# Patient Record
Sex: Male | Born: 1975
Health system: Southern US, Community
[De-identification: ages and names within clinical notes are randomized; demographics above are authoritative.]

## PROBLEM LIST (undated history)

## (undated) DIAGNOSIS — K700 Alcoholic fatty liver: Secondary | ICD-10-CM

## (undated) DIAGNOSIS — F1020 Alcohol dependence, uncomplicated: Secondary | ICD-10-CM

## (undated) DIAGNOSIS — I5022 Chronic systolic (congestive) heart failure: Secondary | ICD-10-CM

## (undated) DIAGNOSIS — I10 Essential (primary) hypertension: Secondary | ICD-10-CM

## (undated) DIAGNOSIS — G4733 Obstructive sleep apnea (adult) (pediatric): Secondary | ICD-10-CM

## (undated) DIAGNOSIS — F419 Anxiety disorder, unspecified: Secondary | ICD-10-CM

## (undated) DIAGNOSIS — E119 Type 2 diabetes mellitus without complications: Secondary | ICD-10-CM

## (undated) HISTORY — DX: Chronic systolic (congestive) heart failure: I50.22

## (undated) HISTORY — DX: Type 2 diabetes mellitus without complications: E11.9

---

## 2020-12-06 ENCOUNTER — Encounter (HOSPITAL_COMMUNITY): Payer: Self-pay

## 2020-12-06 ENCOUNTER — Inpatient Hospital Stay (HOSPITAL_COMMUNITY)
Admission: EM | Admit: 2020-12-06 | Discharge: 2020-12-11 | DRG: 175 | Disposition: A | Discharge status: Home / Self Care | Payer: 59 | Attending: Internal Medicine | Admitting: Internal Medicine

## 2020-12-06 ENCOUNTER — Other Ambulatory Visit: Payer: Self-pay

## 2020-12-06 ENCOUNTER — Emergency Department (HOSPITAL_COMMUNITY): Payer: 59

## 2020-12-06 DIAGNOSIS — E876 Hypokalemia: Secondary | ICD-10-CM | POA: Diagnosis not present

## 2020-12-06 DIAGNOSIS — I313 Pericardial effusion (noninflammatory): Secondary | ICD-10-CM | POA: Diagnosis present

## 2020-12-06 DIAGNOSIS — F419 Anxiety disorder, unspecified: Secondary | ICD-10-CM | POA: Diagnosis present

## 2020-12-06 DIAGNOSIS — N62 Hypertrophy of breast: Secondary | ICD-10-CM | POA: Diagnosis present

## 2020-12-06 DIAGNOSIS — R0902 Hypoxemia: Secondary | ICD-10-CM

## 2020-12-06 DIAGNOSIS — K7460 Unspecified cirrhosis of liver: Secondary | ICD-10-CM | POA: Diagnosis present

## 2020-12-06 DIAGNOSIS — I5021 Acute systolic (congestive) heart failure: Secondary | ICD-10-CM | POA: Diagnosis present

## 2020-12-06 DIAGNOSIS — R188 Other ascites: Secondary | ICD-10-CM | POA: Diagnosis present

## 2020-12-06 DIAGNOSIS — I2699 Other pulmonary embolism without acute cor pulmonale: Secondary | ICD-10-CM | POA: Diagnosis not present

## 2020-12-06 DIAGNOSIS — Z6832 Body mass index (BMI) 32.0-32.9, adult: Secondary | ICD-10-CM

## 2020-12-06 DIAGNOSIS — I081 Rheumatic disorders of both mitral and tricuspid valves: Secondary | ICD-10-CM | POA: Diagnosis present

## 2020-12-06 DIAGNOSIS — Z20822 Contact with and (suspected) exposure to covid-19: Secondary | ICD-10-CM | POA: Diagnosis present

## 2020-12-06 DIAGNOSIS — J9601 Acute respiratory failure with hypoxia: Secondary | ICD-10-CM | POA: Diagnosis present

## 2020-12-06 DIAGNOSIS — I110 Hypertensive heart disease with heart failure: Secondary | ICD-10-CM | POA: Diagnosis present

## 2020-12-06 DIAGNOSIS — R000 Tachycardia, unspecified: Secondary | ICD-10-CM

## 2020-12-06 DIAGNOSIS — Z87891 Personal history of nicotine dependence: Secondary | ICD-10-CM

## 2020-12-06 DIAGNOSIS — I272 Pulmonary hypertension, unspecified: Secondary | ICD-10-CM | POA: Diagnosis present

## 2020-12-06 DIAGNOSIS — R109 Unspecified abdominal pain: Secondary | ICD-10-CM

## 2020-12-06 DIAGNOSIS — I2694 Multiple subsegmental pulmonary emboli without acute cor pulmonale: Principal | ICD-10-CM | POA: Diagnosis present

## 2020-12-06 DIAGNOSIS — F1021 Alcohol dependence, in remission: Secondary | ICD-10-CM | POA: Diagnosis present

## 2020-12-06 DIAGNOSIS — G4733 Obstructive sleep apnea (adult) (pediatric): Secondary | ICD-10-CM | POA: Diagnosis present

## 2020-12-06 DIAGNOSIS — E119 Type 2 diabetes mellitus without complications: Secondary | ICD-10-CM | POA: Diagnosis present

## 2020-12-06 DIAGNOSIS — Z79899 Other long term (current) drug therapy: Secondary | ICD-10-CM

## 2020-12-06 DIAGNOSIS — I420 Dilated cardiomyopathy: Secondary | ICD-10-CM

## 2020-12-06 HISTORY — DX: Anxiety disorder, unspecified: F41.9

## 2020-12-06 HISTORY — DX: Alcoholic fatty liver: K70.0

## 2020-12-06 HISTORY — DX: Obstructive sleep apnea (adult) (pediatric): G47.33

## 2020-12-06 HISTORY — DX: Alcohol dependence, uncomplicated: F10.20

## 2020-12-06 HISTORY — DX: Essential (primary) hypertension: I10

## 2020-12-06 LAB — URINALYSIS, ROUTINE W REFLEX MICROSCOPIC
Bilirubin Urine: NEGATIVE
Glucose, UA: NEGATIVE mg/dL
Hgb urine dipstick: NEGATIVE
Ketones, ur: 5 mg/dL — AB
Leukocytes,Ua: NEGATIVE
Nitrite: NEGATIVE
Protein, ur: 100 mg/dL — AB
Specific Gravity, Urine: 1.018 (ref 1.005–1.030)
pH: 5 (ref 5.0–8.0)

## 2020-12-06 LAB — COMPREHENSIVE METABOLIC PANEL
ALT: 21 U/L (ref 0–44)
AST: 27 U/L (ref 15–41)
Albumin: 3.8 g/dL (ref 3.5–5.0)
Alkaline Phosphatase: 90 U/L (ref 38–126)
Anion gap: 15 (ref 5–15)
BUN: 14 mg/dL (ref 6–20)
CO2: 23 mmol/L (ref 22–32)
Calcium: 9.1 mg/dL (ref 8.9–10.3)
Chloride: 101 mmol/L (ref 98–111)
Creatinine, Ser: 1.17 mg/dL (ref 0.61–1.24)
GFR, Estimated: >60 mL/min (ref >60)
Glucose, Bld: 162 mg/dL — ABNORMAL HIGH (ref 70–99)
Potassium: 3.5 mmol/L (ref 3.5–5.1)
Sodium: 139 mmol/L (ref 135–145)
Total Bilirubin: 1.8 mg/dL — ABNORMAL HIGH (ref 0.3–1.2)
Total Protein: 6.9 g/dL (ref 6.5–8.1)

## 2020-12-06 LAB — CBC
HCT: 50.4 % (ref 39.0–52.0)
Hemoglobin: 15.9 g/dL (ref 13.0–17.0)
MCH: 30.6 pg (ref 26.0–34.0)
MCHC: 31.5 g/dL (ref 30.0–36.0)
MCV: 96.9 fL (ref 80.0–100.0)
Platelets: 303 10*3/uL (ref 150–400)
RBC: 5.2 MIL/uL (ref 4.22–5.81)
RDW: 12.7 % (ref 11.5–15.5)
WBC: 9.3 10*3/uL (ref 4.0–10.5)
nRBC: 0 % (ref 0.0–0.2)

## 2020-12-06 LAB — PROTIME-INR
INR: 1.3 — ABNORMAL HIGH (ref 0.8–1.2)
Prothrombin Time: 15.7 seconds — ABNORMAL HIGH (ref 11.4–15.2)

## 2020-12-06 LAB — LIPASE, BLOOD: Lipase: 44 U/L (ref 11–51)

## 2020-12-06 LAB — BRAIN NATRIURETIC PEPTIDE: B Natriuretic Peptide: 1516.1 pg/mL — ABNORMAL HIGH (ref 0.0–100.0)

## 2020-12-06 MED ORDER — IOHEXOL 350 MG/ML SOLN
80.0000 mL | Freq: Once | INTRAVENOUS | Status: AC | PRN
  Administered 2020-12-06: 80 mL via INTRAVENOUS

## 2020-12-06 MED ORDER — ENOXAPARIN SODIUM 120 MG/0.8ML ~~LOC~~ SOLN
110.0000 mg | Freq: Two times a day (BID) | SUBCUTANEOUS | Status: DC
  Administered 2020-12-07 – 2020-12-09 (×6): 110 mg via SUBCUTANEOUS
  Filled 2020-12-06 (×7): qty 0.74

## 2020-12-06 NOTE — ED Notes (Addendum)
Pt ambulated to bathroom and back, extremely short of breath with increased respirations. Triage RN notified, VS rechecked.

## 2020-12-06 NOTE — ED Notes (Signed)
Pt reports SOB started in December. He initially thought it could be covid and "tried to wait it out." Since then pt has had increase in DOE, swelling in abd and lower extremities, and has to prop himself up in bed-- does not tolerate laying flat.  Pt reports he used to drink 6-12 beers a day, but quit three weeks ago.   Pt is tachypnic and tachycardic at this time  HX of fatty liver, HTN, and anxiety

## 2020-12-06 NOTE — ED Triage Notes (Signed)
Pt sent from UC for further eval of sob, bilateral leg swelling and abd distention. Pt had xray at [facility] showing "Mild diffuse pulmonary venous congestion with interstitial edema and bilateral pleural effusions". 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 DOE noted in triage, rr labored

## 2020-12-06 NOTE — ED Provider Notes (Signed)
[facility] EMERGENCY DEPARTMENT Provider Note   CSN: [id number] Arrival date & time: 12/06/20  1552     History Chief Complaint  Patient presents with  . Shortness of Breath  . Leg Swelling  . Abdominal Distention    Gregory Stevenson is a 45 y.o. male.  45 year old male with history of anxiety and hypertension presents with complaint of shortness of breath x 3 weeks.  Patient states he has been a heavy drinker off and on, recently decided to cut back and quit all together 3 weeks ago. At that time he gained 10lbs, feels like abdomen and legs are swollen, gets full quickly when eating. 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 States SHOB only on exertion, resolves with rest, denies CP, fevers. Also orthopnea, sleeping with more pillows than usual and using his CPAP. Former smoker, quit several years ago. Takes atenolol for HTN, hasn't missed any doses. Patient went to UC today and was told his XR appears to have fluid around his heart, concern for heart/liver failure and advised to go to the ER.  Gregory Stevenson was evaluated in Emergency Department on 12/06/2020 for the symptoms described in the history of present illness. He was evaluated in the context of the global COVID-19 pandemic, which necessitated consideration that the patient might be at risk for infection with the SARS-CoV-2 virus that causes COVID-19. Institutional protocols and algorithms that pertain to the evaluation of patients at risk for COVID-19 are in a state of rapid change based on information released by regulatory bodies including the CDC and federal and state organizations. These policies and algorithms were followed during the patient's care in the ED.         History reviewed. No pertinent past medical history.  Patient Active Problem List   Diagnosis Date Noted  . Pulmonary embolism (HCC) 12/06/2020    History reviewed. No pertinent surgical  history.     No family history on file.     Home Medications Prior to Admission medications   Medication Sig Start Date End Date Taking? Authorizing Provider  atenolol (TENORMIN) 25 MG tablet  09/11/20   [provider]  PARoxetine (PAXIL) 20 MG tablet Take 20 mg by mouth daily. 09/13/20   [provider]    Allergies    Patient has no allergy information on record.  Review of Systems   Review of Systems  Constitutional: Negative for chills and fever.  Respiratory: Positive for shortness of breath. Negative for wheezing.   Cardiovascular: Positive for leg swelling. Negative for chest pain and palpitations.  Gastrointestinal: Positive for abdominal distention. Negative for abdominal pain, constipation, diarrhea, nausea and vomiting.  Genitourinary: Negative for difficulty urinating and dysuria.  Musculoskeletal: Negative for arthralgias and myalgias.  Skin: Negative for rash and wound.  Allergic/Immunologic: Negative for immunocompromised state.  Neurological: Negative for dizziness, weakness and headaches.  Hematological: Does not bruise/bleed easily.  Psychiatric/Behavioral: Negative for confusion.  All other systems reviewed and are negative.   Physical Exam Updated Vital Signs BP (!) 113/95   Pulse (!) 115   Temp 98.1 F (36.7 C) (Oral)   Resp (!) 33   Ht 6' (1.829 m)   Wt 107 kg   SpO2 99%   BMI 32.01 kg/m   Physical Exam Vitals and nursing note reviewed.  Constitutional:      General: He is not in acute distress.    Appearance: He is well-developed and well-nourished.  He is obese. He is not diaphoretic.  HENT:     Head: Normocephalic and atraumatic.  Cardiovascular:     Rate and Rhythm: Normal rate and regular rhythm.     Pulses: Normal pulses.  Pulmonary:     Effort: Pulmonary effort is normal.     Breath sounds: Examination of the right-lower field reveals decreased breath sounds. Examination of the left-lower field reveals decreased  breath sounds. Decreased breath sounds present.     Comments: Able to carry a conversation without appearing SHOB however O2 sat drops to 80% Chest:     Chest wall: No tenderness.  Abdominal:     Palpations: Abdomen is soft.     Tenderness: There is no abdominal tenderness.  Musculoskeletal:     Cervical back: Neck supple.     Right lower leg: Edema present.     Left lower leg: Edema present.  Skin:    General: Skin is warm and dry.     Findings: No erythema or rash.  Neurological:     Mental Status: He is alert and oriented to person, place, and time.  Psychiatric:        Mood and Affect: Mood and affect normal.        Behavior: Behavior normal.     ED Results / Procedures / Treatments   Labs (all labs ordered are listed, but only abnormal results are displayed) Labs Reviewed  COMPREHENSIVE METABOLIC PANEL - Abnormal; Notable for the following components:      Result Value   Glucose, Bld 162 (*)    Total Bilirubin 1.8 (*)    All other components within normal limits  URINALYSIS, ROUTINE W REFLEX MICROSCOPIC - Abnormal; Notable for the following components:   Color, Urine AMBER (*)    APPearance HAZY (*)    Ketones, ur 5 (*)    Protein, ur 100 (*)    Bacteria, UA RARE (*)    All other components within normal limits  PROTIME-INR - Abnormal; Notable for the following components:   Prothrombin Time 15.7 (*)    INR 1.3 (*)    All other components within normal limits  BRAIN NATRIURETIC PEPTIDE - Abnormal; Notable for the following components:   B Natriuretic Peptide 1,516.1 (*)    All other components within normal limits  RESP PANEL BY RT-PCR (FLU A&B, COVID) ARPGX2  LIPASE, BLOOD  CBC  CBC  TROPONIN I (HIGH SENSITIVITY)    EKG EKG Interpretation  Date/Time:  Friday December 06 2020 15:58:58 EST Ventricular Rate:  124 PR Interval:    QRS Duration: 156 QT Interval:  410 QTC Calculation: 589 R Axis:   -134 Text Interpretation: Sinus tachycardia with short PR  Right bundle branch block Abnormal ECG No old tracing to compare Confirmed by Eber Hong ([id number]) on 12/06/2020 9:31:27 PM   Radiology DG Chest 2 View  Result Date: 12/06/2020 CLINICAL DATA:  Shortness of breath on exertion. EXAM: CHEST - 2 VIEW COMPARISON:  None. FINDINGS: Mildly increased interstitial lung markings are seen. There is mild elevation of the right hemidiaphragm with a small right pleural effusion. No pneumothorax is seen. The cardiac silhouette is moderately enlarged. The visualized skeletal structures are unremarkable. IMPRESSION: 1. Cardiomegaly with findings consistent with mild interstitial edema. 2. Small right pleural effusion. Electronically Signed   By: Aram Candela M.D.   On: 12/06/2020 21:45   CT Angio Chest PE W/Cm &/Or Wo Cm  Result Date: 12/06/2020 CLINICAL DATA:  Shortness of breath, leg swelling  EXAM: CT ANGIOGRAPHY CHEST WITH CONTRAST TECHNIQUE: Multidetector CT imaging of the chest was performed using the standard protocol during bolus administration of intravenous contrast. Multiplanar CT image reconstructions and MIPs were obtained to evaluate the vascular anatomy. CONTRAST:  80mL OMNIPAQUE IOHEXOL 350 MG/ML SOLN COMPARISON:  None. FINDINGS: Cardiovascular: Satisfactory opacification of the pulmonary arteries to the segmental level. Limited evaluation at the subsegmental level due to respiratory motion artifact. Filling defect within the right lower lobe segmental and subsegmental pulmonary arteries. The main pulmonary artery is enlarged measuring up to 3.3 cm. Prominent left ventricle. No increased right to left ventricular ratio. No straightening or paradoxical bowing of the interventricular septum. Trace pericardial effusion. The thoracic aorta is normal in caliber. Mediastinum/Nodes: Borderline enlarged prevascular lymph node measuring up to 1.1 cm (7:123). Prominent hilar lymph nodes. No enlarged hilar or axillary lymph nodes. Thyroid gland, trachea, and  esophagus demonstrate no significant findings. Lungs/Pleura: Right lower passive lobe atelectasis. Small to moderate volume right pleural effusion. Trace left pleural effusion. Upper Abdomen: Least small volume simple free fluid ascites. Otherwise no acute abnormality visualized. Musculoskeletal: Asymmetric left greater than right gynecomastia. No suspicious lytic or blastic osseous lesions. No acute displaced fracture. Review of the MIP images confirms the above findings. IMPRESSION: 1. Right lower lobe segmental and subsegmental pulmonary embolus. Limited evaluation due to respiratory motion artifact. No associated right heart strain or pulmonary infarction. Enlarged main pulmonary artery suggesting pulmonary hypertension. 2. Asymmetric gynecomastia. Recommend correlation with prior cross-sectional imaging for stability. If not available, recommend further evaluation with mammogram. 3. Small to moderate volume right pleural effusion and trace left pleural effusion. Associated right lower lobe passive atelectasis. Superimposed infection/inflammation of the right lower lobe not excluded. 4. Likely reactive hilar and mediastinal lymph nodes. 5. Prominent left ventricle. 6. Trace pericardial effusion. 7. At least small volume simple free fluid ascites. These results were called by telephone at the time of interpretation on 12/06/2020 at 11:21 pm to provider [facility] Kianah Harries , who verbally acknowledged these results. Electronically Signed   By: Tish FredericksonMorgane  Naveau M.D.   On: 12/06/2020 23:28    Procedures .Critical Care Performed by: Jeannie FendMurphy, Amonda Brillhart A, PA-C Authorized by: Jeannie FendMurphy, Keshia Weare A, PA-C   Critical care provider statement:    Critical care time (minutes):  45   Critical care was time spent personally by me on the following activities:  Discussions with consultants, evaluation of patient's response to treatment, examination of patient, ordering and performing treatments and interventions, ordering and review of  laboratory studies, ordering and review of radiographic studies, pulse oximetry, re-evaluation of patient's condition, obtaining history from patient or surrogate and review of old charts   (including critical care time)  Medications Ordered in ED Medications  enoxaparin (LOVENOX) injection 110 mg (has no administration in time range)  iohexol (OMNIPAQUE) 350 MG/ML injection 80 mL (80 mLs Intravenous Contrast Given 12/06/20 2302)    ED Course  I have reviewed the triage vital signs and the nursing notes.  Pertinent labs & imaging results that were available during my care of the patient were reviewed by me and considered in my medical decision making (see chart for details).  Clinical Course as of 12/06/20 2354  Fri Dec 06, 2020  2302 45 yo male as above, on exam, found to have pitting edema to lower extremities, diminished lung sounds in the base. While talking, not visible SHOB however does have decrease in O2 sat on room air to 80%, placed on 2L [place], sats improve back  to 100%.  Plan is for CT rule out PE. Plan for admission after CT.  [LM]  2313 Chest x-ray report has not crossed over from EMR, cardiomegaly with findings consistent with mild interstitial edema, small right pleural effusion. [LM]  2317 BNP elevated 1516.1. Lipase within normal limits, INR 1.3. CBC unremarkable. CMP with total bilirubin 1.8, LFTs normal.  Review of vitals, patient is persistently tachycardic, tachypneic, blood pressure without significant findings, afebrile. O2 sat upper 90s while resting however dropped to 80% while talking. [LM]  2321 Call from radiology, limited with motion artifact with right segmental and subsegmental PE. No heart strain. Ascites.  [LM]  2353 Discussed with Dr. Toniann Fail with hospitalist service who will consult for admission.  [LM]    Clinical Course User Index [LM] Alden Hipp   MDM Rules/Calculators/A&P                          Final Clinical Impression(s) / ED  Diagnoses Final diagnoses:  Multiple subsegmental pulmonary emboli without acute [facility])  Hypoxia    Rx / DC Orders ED Discharge Orders    None       Jeannie Fend, PA-C 12/06/20 2354    Arby Barrette, MD 12/08/20 1940

## 2020-12-06 NOTE — Progress Notes (Signed)
ANTICOAGULATION CONSULT NOTE - Initial Consult  Pharmacy Consult for Lovenox Indication: pulmonary embolus  Not on File  Patient Measurements: Height: 6' (182.9 cm) Weight: 107 kg (236 lb) IBW/kg (Calculated) : 77.6  Vital Signs: Temp: 98.1 F (36.7 C) (01/14 1943) Temp Source: Oral (01/14 1943) BP: 113/95 (01/14 2245) Pulse Rate: 115 (01/14 2300)  Labs: Recent Labs    12/06/20 1600  HGB 15.9  HCT 50.4  PLT 303  LABPROT 15.7*  INR 1.3*  CREATININE 1.17    Estimated Creatinine Clearance: 99.8 mL/min (by C-G formula based on SCr of 1.17 mg/dL).   Medical History: History reviewed. No pertinent past medical history.  Medications:  No current facility-administered medications on file prior to encounter.   Current Outpatient Medications on File Prior to Encounter  Medication Sig Dispense Refill  . atenolol (TENORMIN) 25 MG tablet     . PARoxetine (PAXIL) 20 MG tablet Take 20 mg by mouth daily.       Assessment: 45 y.o. male with new PE for Lovenox Goal of Therapy:  Full anticoagulation with Lovenox Monitor platelets by anticoagulation protocol: Yes   Plan:  Lovenox 110 mg SQ q12h  Eddie Candle 12/06/2020,11:28 PM

## 2020-12-06 NOTE — ED Notes (Signed)
Off floor to CT 

## 2020-12-07 ENCOUNTER — Inpatient Hospital Stay (HOSPITAL_COMMUNITY): Payer: 59

## 2020-12-07 ENCOUNTER — Encounter (HOSPITAL_COMMUNITY): Payer: Self-pay | Admitting: Internal Medicine

## 2020-12-07 DIAGNOSIS — I2699 Other pulmonary embolism without acute cor pulmonale: Secondary | ICD-10-CM

## 2020-12-07 DIAGNOSIS — R609 Edema, unspecified: Secondary | ICD-10-CM

## 2020-12-07 DIAGNOSIS — E119 Type 2 diabetes mellitus without complications: Secondary | ICD-10-CM | POA: Diagnosis present

## 2020-12-07 DIAGNOSIS — R188 Other ascites: Secondary | ICD-10-CM | POA: Diagnosis present

## 2020-12-07 DIAGNOSIS — R000 Tachycardia, unspecified: Secondary | ICD-10-CM | POA: Diagnosis not present

## 2020-12-07 DIAGNOSIS — G4733 Obstructive sleep apnea (adult) (pediatric): Secondary | ICD-10-CM | POA: Diagnosis present

## 2020-12-07 DIAGNOSIS — F419 Anxiety disorder, unspecified: Secondary | ICD-10-CM | POA: Diagnosis present

## 2020-12-07 DIAGNOSIS — I5021 Acute systolic (congestive) heart failure: Secondary | ICD-10-CM

## 2020-12-07 DIAGNOSIS — Z87891 Personal history of nicotine dependence: Secondary | ICD-10-CM | POA: Diagnosis not present

## 2020-12-07 DIAGNOSIS — Z20822 Contact with and (suspected) exposure to covid-19: Secondary | ICD-10-CM | POA: Diagnosis present

## 2020-12-07 DIAGNOSIS — N62 Hypertrophy of breast: Secondary | ICD-10-CM | POA: Diagnosis present

## 2020-12-07 DIAGNOSIS — K7460 Unspecified cirrhosis of liver: Secondary | ICD-10-CM | POA: Diagnosis present

## 2020-12-07 DIAGNOSIS — J9601 Acute respiratory failure with hypoxia: Secondary | ICD-10-CM | POA: Diagnosis present

## 2020-12-07 DIAGNOSIS — I272 Pulmonary hypertension, unspecified: Secondary | ICD-10-CM | POA: Diagnosis present

## 2020-12-07 DIAGNOSIS — I2694 Multiple subsegmental pulmonary emboli without acute cor pulmonale: Secondary | ICD-10-CM | POA: Diagnosis present

## 2020-12-07 DIAGNOSIS — I081 Rheumatic disorders of both mitral and tricuspid valves: Secondary | ICD-10-CM | POA: Diagnosis present

## 2020-12-07 DIAGNOSIS — E876 Hypokalemia: Secondary | ICD-10-CM | POA: Diagnosis not present

## 2020-12-07 DIAGNOSIS — I420 Dilated cardiomyopathy: Secondary | ICD-10-CM | POA: Diagnosis not present

## 2020-12-07 DIAGNOSIS — I5043 Acute on chronic combined systolic (congestive) and diastolic (congestive) heart failure: Secondary | ICD-10-CM | POA: Diagnosis not present

## 2020-12-07 DIAGNOSIS — I2602 Saddle embolus of pulmonary artery with acute cor pulmonale: Secondary | ICD-10-CM

## 2020-12-07 DIAGNOSIS — F1021 Alcohol dependence, in remission: Secondary | ICD-10-CM | POA: Diagnosis present

## 2020-12-07 DIAGNOSIS — I313 Pericardial effusion (noninflammatory): Secondary | ICD-10-CM | POA: Diagnosis present

## 2020-12-07 DIAGNOSIS — Z6832 Body mass index (BMI) 32.0-32.9, adult: Secondary | ICD-10-CM | POA: Diagnosis not present

## 2020-12-07 DIAGNOSIS — Z79899 Other long term (current) drug therapy: Secondary | ICD-10-CM | POA: Diagnosis not present

## 2020-12-07 DIAGNOSIS — I110 Hypertensive heart disease with heart failure: Secondary | ICD-10-CM | POA: Diagnosis present

## 2020-12-07 LAB — CBC
HCT: 42.3 % (ref 39.0–52.0)
Hemoglobin: 13.5 g/dL (ref 13.0–17.0)
MCH: 30.5 pg (ref 26.0–34.0)
MCHC: 31.9 g/dL (ref 30.0–36.0)
MCV: 95.5 fL (ref 80.0–100.0)
Platelets: 237 10*3/uL (ref 150–400)
RBC: 4.43 MIL/uL (ref 4.22–5.81)
RDW: 12.7 % (ref 11.5–15.5)
WBC: 5.7 10*3/uL (ref 4.0–10.5)
nRBC: 0 % (ref 0.0–0.2)

## 2020-12-07 LAB — TROPONIN I (HIGH SENSITIVITY)
Troponin I (High Sensitivity): 27 ng/L — ABNORMAL HIGH (ref <18)
Troponin I (High Sensitivity): 38 ng/L — ABNORMAL HIGH (ref <18)

## 2020-12-07 LAB — ECHOCARDIOGRAM COMPLETE
Area-P 1/2: 4.31 cm2
Calc EF: 27.8 %
Height: 72 in
MV M vel: 4.48 m/s
MV Peak grad: 80.3 mmHg
Radius: 0.4 cm
S' Lateral: 5.6 cm
Single Plane A2C EF: 18.7 %
Single Plane A4C EF: 34 %
Weight: 3776 oz

## 2020-12-07 LAB — RESP PANEL BY RT-PCR (FLU A&B, COVID) ARPGX2
Influenza A by PCR: NEGATIVE
Influenza B by PCR: NEGATIVE
SARS Coronavirus 2 by RT PCR: NEGATIVE

## 2020-12-07 LAB — TSH: TSH: 1.733 u[IU]/mL (ref 0.350–4.500)

## 2020-12-07 LAB — PROCALCITONIN: Procalcitonin: 0.15 ng/mL

## 2020-12-07 LAB — HIV ANTIBODY (ROUTINE TESTING W REFLEX): HIV Screen 4th Generation wRfx: NONREACTIVE

## 2020-12-07 MED ORDER — INFLUENZA VAC SPLIT QUAD 0.5 ML IM SUSY: 0.5000 mL | PREFILLED_SYRINGE | INTRAMUSCULAR | Status: DC

## 2020-12-07 MED ORDER — POTASSIUM CHLORIDE CRYS ER 20 MEQ PO TBCR
20.0000 meq | EXTENDED_RELEASE_TABLET | Freq: Two times a day (BID) | ORAL | Status: DC
  Administered 2020-12-07 – 2020-12-08 (×4): 20 meq via ORAL
  Filled 2020-12-07 (×4): qty 1

## 2020-12-07 MED ORDER — ACETAMINOPHEN 325 MG PO TABS: 650.0000 mg | ORAL_TABLET | Freq: Four times a day (QID) | ORAL | Status: DC | PRN

## 2020-12-07 MED ORDER — ACETAMINOPHEN 650 MG RE SUPP: 650.0000 mg | Freq: Four times a day (QID) | RECTAL | Status: DC | PRN

## 2020-12-07 MED ORDER — FUROSEMIDE 10 MG/ML IJ SOLN
40.0000 mg | Freq: Two times a day (BID) | INTRAMUSCULAR | Status: DC
  Administered 2020-12-07 – 2020-12-10 (×7): 40 mg via INTRAVENOUS
  Filled 2020-12-07 (×7): qty 4

## 2020-12-07 MED ORDER — PAROXETINE HCL 20 MG PO TABS
20.0000 mg | ORAL_TABLET | Freq: Every day | ORAL | Status: DC
  Administered 2020-12-07 – 2020-12-11 (×5): 20 mg via ORAL
  Filled 2020-12-07 (×5): qty 1

## 2020-12-07 MED ORDER — ATENOLOL 25 MG PO TABS
25.0000 mg | ORAL_TABLET | Freq: Every day | ORAL | Status: DC
Start: 2020-12-07 — End: 2020-12-07

## 2020-12-07 NOTE — H&P (Signed)
History and Physical    Gregory Stevenson GHW:[id number] DOB: Apr 05, 1976 DOA: 12/06/2020  PCP: Pcp, No  Patient coming from: Home.  Chief Complaint: Shortness of breath.  HPI: Gregory Stevenson is a 45 y.o. male with history of anxiety presents to the ER because of worsening shortness of breath.  Patient states he usually walks around 5K easily but over the last 3 weeks noticed increasing exertional shortness of breath lower extremity edema hardly able to do any exertion.  Denies any chest pain productive cough fever or chills.  Had gone to the urgent care where he was told that there is a lot of fluid around him and advised to come to the ER.  Patient thought his symptoms may be from COVID initially but since symptoms did not improve he sought medical help.  ED Course: In the ER patient had CT angiogram of the chest which shows right lower lobe segmental and subsegmental pulmonary embolism with no heart strain.  Does show pleural effusion and mild pericardial effusion.  Mediastinal adenopathies were seen.  Patient is started on Lovenox and admitted for further management.  Patient is hemodynamic stable otherwise for tachycardia.  Labs are significant for BNP of 1500 high sensitive troponin is pending.  COVID test is negative.  Review of Systems: As per HPI, rest all negative.   Past Medical History:  Diagnosis Date  . Anxiety     History reviewed. No pertinent surgical history.   reports that he has quit smoking. He has never used smokeless tobacco. He reports previous alcohol use. No history on file for drug use.  Not on File  Family History  Problem Relation Age of Onset  . Diabetes Mellitus II Maternal Grandfather   . Pulmonary embolism Neg Hx     Prior to Admission medications   Medication Sig Start Date End Date Taking? Authorizing Provider  atenolol (TENORMIN) 25 MG tablet  09/11/20   [provider]  PARoxetine (PAXIL) 20 MG tablet Take 20 mg by mouth daily. 09/13/20    [provider]    Physical Exam: Constitutional: Moderately built and nourished. Vitals:   12/06/20 2245 12/06/20 2300 12/07/20 0031 12/07/20 0039  BP: (!) 113/95   (!) 117/96  Pulse: (!) 106 (!) 115  (!) 114  Resp: (!) 23 (!) 33  (!) 25  Temp:      TempSrc:      SpO2:  99% 97% 98%  Weight:      Height:       Eyes: Anicteric no pallor. ENMT: No discharge from the ears eyes nose or mouth. Neck: No mass felt.  No neck rigidity. Respiratory: No rhonchi or crepitations. Cardiovascular: S1-S2 heard. Abdomen: Soft distended nontender bowel sounds present. Musculoskeletal: Mild edema of the both lower extremities. Skin: No rash. Neurologic: Alert awake oriented to time place and person.  Moves all extremities. Psychiatric: Appears normal.  Normal affect.   Labs on Admission: I have personally reviewed following labs and imaging studies  CBC: Recent Labs  Lab 12/06/20 1600  WBC 9.3  HGB 15.9  HCT 50.4  MCV 96.9  PLT 303   Basic Metabolic Panel: Recent Labs  Lab 12/06/20 1600  NA 139  K 3.5  CL 101  CO2 23  GLUCOSE 162*  BUN 14  CREATININE 1.17  CALCIUM 9.1   GFR: Estimated Creatinine Clearance: 99.8 mL/min (by C-G formula based on SCr of 1.17 mg/dL). Liver Function Tests: Recent Labs  Lab 12/06/20 1600  AST 27  ALT 21  ALKPHOS 90  BILITOT 1.8*  PROT 6.9  ALBUMIN 3.8   Recent Labs  Lab 12/06/20 1600  LIPASE 44   No results for input(s): AMMONIA in the last 168 hours. Coagulation Profile: Recent Labs  Lab 12/06/20 1600  INR 1.3*   Cardiac Enzymes: No results for input(s): CKTOTAL, CKMB, CKMBINDEX, TROPONINI in the last 168 hours. BNP (last 3 results) No results for input(s): PROBNP in the last 8760 hours. HbA1C: No results for input(s): HGBA1C in the last 72 hours. CBG: No results for input(s): GLUCAP in the last 168 hours. Lipid Profile: No results for input(s): CHOL, HDL, LDLCALC, TRIG, CHOLHDL, LDLDIRECT in the last 72  hours. Thyroid Function Tests: No results for input(s): TSH, T4TOTAL, FREET4, T3FREE, THYROIDAB in the last 72 hours. Anemia Panel: No results for input(s): VITAMINB12, FOLATE, FERRITIN, TIBC, IRON, RETICCTPCT in the last 72 hours. Urine analysis:    Component Value Date/Time   COLORURINE AMBER (A) 12/06/2020 2006   APPEARANCEUR HAZY (A) 12/06/2020 2006   LABSPEC 1.018 12/06/2020 2006   PHURINE 5.0 12/06/2020 2006   GLUCOSEU NEGATIVE 12/06/2020 2006   HGBUR NEGATIVE 12/06/2020 2006   BILIRUBINUR NEGATIVE 12/06/2020 2006   KETONESUR 5 (A) 12/06/2020 2006   PROTEINUR 100 (A) 12/06/2020 2006   NITRITE NEGATIVE 12/06/2020 2006   LEUKOCYTESUR NEGATIVE 12/06/2020 2006   Sepsis Labs: @LABRCNTIP (procalcitonin:4,lacticidven:4) )No results found for this or any previous visit (from the past 240 hour(s)).   Radiological Exams on Admission: DG Chest 2 View  Result Date: 12/06/2020 CLINICAL DATA:  Shortness of breath on exertion. EXAM: CHEST - 2 VIEW COMPARISON:  None. FINDINGS: Mildly increased interstitial lung markings are seen. There is mild elevation of the right hemidiaphragm with a small right pleural effusion. No pneumothorax is seen. The cardiac silhouette is moderately enlarged. The visualized skeletal structures are unremarkable. IMPRESSION: 1. Cardiomegaly with findings consistent with mild interstitial edema. 2. Small right pleural effusion. Electronically Signed   By: 12/08/2020 M.D.   On: 12/06/2020 21:45   CT Angio Chest PE W/Cm &/Or Wo Cm  Result Date: 12/06/2020 CLINICAL DATA:  Shortness of breath, leg swelling EXAM: CT ANGIOGRAPHY CHEST WITH CONTRAST TECHNIQUE: Multidetector CT imaging of the chest was performed using the standard protocol during bolus administration of intravenous contrast. Multiplanar CT image reconstructions and MIPs were obtained to evaluate the vascular anatomy. CONTRAST:  10mL OMNIPAQUE IOHEXOL 350 MG/ML SOLN COMPARISON:  None. FINDINGS:  Cardiovascular: Satisfactory opacification of the pulmonary arteries to the segmental level. Limited evaluation at the subsegmental level due to respiratory motion artifact. Filling defect within the right lower lobe segmental and subsegmental pulmonary arteries. The main pulmonary artery is enlarged measuring up to 3.3 cm. Prominent left ventricle. No increased right to left ventricular ratio. No straightening or paradoxical bowing of the interventricular septum. Trace pericardial effusion. The thoracic aorta is normal in caliber. Mediastinum/Nodes: Borderline enlarged prevascular lymph node measuring up to 1.1 cm (7:123). Prominent hilar lymph nodes. No enlarged hilar or axillary lymph nodes. Thyroid gland, trachea, and esophagus demonstrate no significant findings. Lungs/Pleura: Right lower passive lobe atelectasis. Small to moderate volume right pleural effusion. Trace left pleural effusion. Upper Abdomen: Least small volume simple free fluid ascites. Otherwise no acute abnormality visualized. Musculoskeletal: Asymmetric left greater than right gynecomastia. No suspicious lytic or blastic osseous lesions. No acute displaced fracture. Review of the MIP images confirms the above findings. IMPRESSION: 1. Right lower lobe segmental and subsegmental pulmonary embolus. Limited evaluation due to respiratory motion  artifact. No associated right heart strain or pulmonary infarction. Enlarged main pulmonary artery suggesting pulmonary hypertension. 2. Asymmetric gynecomastia. Recommend correlation with prior cross-sectional imaging for stability. If not available, recommend further evaluation with mammogram. 3. Small to moderate volume right pleural effusion and trace left pleural effusion. Associated right lower lobe passive atelectasis. Superimposed infection/inflammation of the right lower lobe not excluded. 4. Likely reactive hilar and mediastinal lymph nodes. 5. Prominent left ventricle. 6. Trace pericardial  effusion. 7. At least small volume simple free fluid ascites. These results were called by telephone at the time of interpretation on 12/06/2020 at 11:21 pm to provider [facility] , who verbally acknowledged these results. Electronically Signed   By: Tish Frederickson M.D.   On: 12/06/2020 23:28    EKG: Independently reviewed.  Sinus tachycardia RBBB.  Assessment/Plan Principal Problem:   Pulmonary embolism (HCC) Active Problems:   Anxiety    1. Pulmonary embolism presently hemodynamically stable unprovoked.  We will check Dopplers of the lower extremities since patient does have swelling of the lower extremities.  Check 2D echo patient is on Lovenox which we will continue for now. 2. Pulmonary hypertension and features concerning for fluid overload.  BNP is markedly elevated.  Will check 2D echo and consult cardiology.  Trend cardiac markers.  Patient states he used to drink heavily before until last month.  We will check abdominal sonogram to see if there is any signs of cirrhosis.  Albumin is normal. 3. History of anxiety takes Paxil and atenolol.  Given that patient has pulm embolism with signs of pulmonary hypertension and features of possible CHF versus some other cause for fluid overload which will need further assessment.  Will need close monitoring and inpatient status.   DVT prophylaxis: Lovenox. Code Status: Full code. Family Communication: Discussed with patient. Disposition Plan: Home. Consults called: Cardiology. Admission status: Inpatient.   Eduard Clos MD [facility] Pager [phone].  If 7PM-7AM, please contact night-coverage [email or web address] Password [email or web address]  12/07/2020, 1:03 AM

## 2020-12-07 NOTE — ED Notes (Signed)
Attempted to call report x2

## 2020-12-07 NOTE — Progress Notes (Signed)
VASCULAR LAB    Bilateral lower extremity venous duplex has been performed.  See CV proc for preliminary results.   Nylen Creque, RVT 12/07/2020, 11:17 AM

## 2020-12-07 NOTE — Progress Notes (Signed)
Echocardiogram 2D Echocardiogram has been performed.  Gregory Stevenson 12/07/2020, 2:37 PM

## 2020-12-07 NOTE — ED Notes (Signed)
Attempted to call report x 1  

## 2020-12-07 NOTE — Progress Notes (Signed)
ANTICOAGULATION CONSULT NOTE - Initial Consult  Pharmacy Consult for Lovenox Indication: pulmonary embolus  Not on File  Patient Measurements: Height: 6' (182.9 cm) Weight: 107 kg (236 lb) IBW/kg (Calculated) : 77.6  Vital Signs: Temp: 97.7 F (36.5 C) (01/15 0825) Temp Source: Oral (01/15 0825) BP: 119/95 (01/15 0825) Pulse Rate: 104 (01/15 0825)  Labs: Recent Labs    12/06/20 1600 12/07/20 0040 12/07/20 0406  HGB 15.9  --  13.5  HCT 50.4  --  42.3  PLT 303  --  237  LABPROT 15.7*  --   --   INR 1.3*  --   --   CREATININE 1.17  --   --   TROPONINIHS  --  27* 38*    Estimated Creatinine Clearance: 99.8 mL/min (by C-G formula based on SCr of 1.17 mg/dL).   Medical History: Past Medical History:  Diagnosis Date  . Anxiety     Medications:  No current facility-administered medications on file prior to encounter.   Current Outpatient Medications on File Prior to Encounter  Medication Sig Dispense Refill  . atenolol (TENORMIN) 25 MG tablet     . PARoxetine (PAXIL) 20 MG tablet Take 20 mg by mouth daily.       Assessment: 45 year old male with no prior medical history presented to [facility] ED 1/14 with SOB and possible PE. Pharmacy consulted to start treatment lovenox.  CBC stable WNL, no bleeding reported per RN.  Goal of Therapy:  Full anticoagulation with Lovenox Monitor platelets by anticoagulation protocol: Yes   Plan:  - Continue lovenox 110 SQ every 12 hours - Monitor CBC and s/sx's of bleeding - Follow-up PE work-up     Kijana Cromie L. Arlester Marker, PharmD, MBA [facility] PGY2 Pharmacy Resident Weekends 7:00 am - 3:00 pm, please call [phone] 12/07/20      8:48 AM  Please check AMION for all [facility] Pharmacy phone numbers After 10:00 PM, call the Main Pharmacy [phone]

## 2020-12-07 NOTE — Progress Notes (Signed)
Pt's HR went up to 141 sustained per CCMD. Pt laying on bed, on the phone and denies any chest pain or SOB. TRH on call X.Blount notified and cardiology office was also informed on previous shift. No further orders at this time. Will continue to monitor pt.

## 2020-12-07 NOTE — Consult Note (Addendum)
Cardiology Consultation:   Patient ID: Milo Schreier MRN: [id number]; DOB: [birth year]  Admit date: 12/06/2020 Date of Consult: 12/07/2020  Primary Care Provider: Oneita Hurt No [facility] Cardiologist: New   [facility] Electrophysiologist:  None     Patient Profile:   Cassell Voorhies is a 45 y.o. male with a hx of hypertension, sleep apnea, alcoholism, anxiety who is being seen today for the evaluation of congestive heart failure and pulmonary hypertension at the request of Dr. Toniann Fail.  History of Present Illness:   Mr. Altamura has a hx of alcoholism and has been sober about a month.  Since early Dec, he has noted dyspnea on exertion, leg edema and abdominal bloating.  He has not had chest pain, syncope.  He does note orthopnea but no paroxysmal nocturnal dyspnea.  He uses CPAP at night.  He went to urgent care yesterday to be evaluated and was sent to the ED.  His Chest CT showed RLL pulmonary embolism. His BNP is high and he has evidence of CHF on his CXR.  Cardiology is asked to further evaluate.     Data from admission: K+ 3.5, SCr 1.17, AST 27, ALT 21, Hgb 15.9, TSH 1.733 hsTrop 27>>38 BNP 1516.1 SARS-CoV-2 neg CXR: +CHF, small R effusion EKG 12/06/20:  Sinus tachy, HR 124, RAD, RBBB, QTc 589   Chest CTA 12/06/20:  RLL seg & subseg PE; no R heart strain or pul infarct; enlarged main PA suggesting pulmonary hypertension Small to mod R pleural effusion; trace L pleural effusion Likely reactive hilar and mediastinal LNs Trace pericardial effusion Small volume ascites   Past Medical History:  Diagnosis Date  . Alcoholic fatty liver   . Alcoholism (HCC)    sober since 10/2020  . Anxiety   . HTN (hypertension)   . OSA (obstructive sleep apnea)    on CPAP    History reviewed. No pertinent surgical history.   Home Medications:  Prior to Admission medications   Medication Sig Start Date End Date Taking? Authorizing Provider  atenolol (TENORMIN) 25 MG tablet  09/11/20    [provider]  PARoxetine (PAXIL) 20 MG tablet Take 20 mg by mouth daily. 09/13/20   [provider]    Inpatient Medications: Scheduled Meds: . atenolol  25 mg Oral Daily  . enoxaparin (LOVENOX) injection  110 mg Subcutaneous BID  . furosemide  40 mg Intravenous BID  . [START ON 12/08/2020] influenza vac split quadrivalent PF  0.5 mL Intramuscular Tomorrow-1000  . PARoxetine  20 mg Oral Daily  . potassium chloride  20 mEq Oral BID   Continuous Infusions:  PRN Meds: acetaminophen **OR** acetaminophen  Allergies:   Not on File  Social History:   Social History   Socioeconomic History  . Marital status: Divorced    Spouse name: Not on file  . Number of children: Not on file  . Years of education: Not on file  . Highest education level: Not on file  Occupational History  . Not on file  Tobacco Use  . Smoking status: Former Games developer  . Smokeless tobacco: Never Used  Substance and Sexual Activity  . Alcohol use: Not Currently    Comment: prior alcohol abuse; sober for 1 month  . Drug use: Never  . Sexual activity: Not on file  Other Topics Concern  . Not on file  Social History Narrative  . Not on file   Social Determinants of Health   Financial Resource Strain: Not on file  Food Insecurity: Not  on file  Transportation Needs: Not on file  Physical Activity: Not on file  Stress: Not on file  Social Connections: Not on file  Intimate Partner Violence: Not on file    Family History:     Family History  Problem Relation Age of Onset  . Diabetes Mellitus II Maternal Grandfather   . Pulmonary embolism Neg Hx   . CAD Neg Hx      ROS:  Please see the history of present illness.  No melena, hematochezia, hematuria, vomiting, diarrhea, fevers.  All other ROS reviewed and negative.     Physical Exam/Data:   Vitals:   12/07/20 0245 12/07/20 0315 12/07/20 0351 12/07/20 0825  BP: (!) 116/92 102/86 (!) 116/96 (!) 119/95  Pulse: (!) 105 (!) 111 (!)  108 (!) 104  Resp: (!) 26  20 17   Temp:   97.6 F (36.4 C) 97.7 F (36.5 C)  TempSrc:   Oral Oral  SpO2: 96% 98% 92% 97%  Weight:      Height:       No intake or output data in the 24 hours ending 12/07/20 0938 Last 3 Weights 12/06/2020  Weight (lbs) 236 lb  Weight (kg) 107.049 kg     Body mass index is 32.01 kg/m.  General:  Well nourished, well developed, in no acute distress  HEENT: normal Lymph: no adenopathy Neck: +JVD Endocrine:  No thryomegaly Vascular: No carotid bruits  Cardiac:  normal S1, S2; +S3, rapid reg rhythm; 3/6 holosystolic murmur at LLSB/apex Lungs:  clear to auscultation bilaterally, no wheezing, rhonchi or rales  Abd: distended   Ext: 1+ bilat LE edema Musculoskeletal:  No deformities  Skin: somewhat diaphoretic   Neuro:  CNs 2-12 intact, no focal abnormalities noted Psych:  Normal affect   EKG: see HPI  Relevant CV Studies: Echocardiogram pending  Laboratory Data:  High Sensitivity Troponin:   Recent Labs  Lab 12/07/20 0040 12/07/20 0406  TROPONINIHS 27* 38*     Chemistry Recent Labs  Lab 12/06/20 1600  NA 139  K 3.5  CL 101  CO2 23  GLUCOSE 162*  BUN 14  CREATININE 1.17  CALCIUM 9.1  GFRNONAA >60  ANIONGAP 15    Recent Labs  Lab 12/06/20 1600  PROT 6.9  ALBUMIN 3.8  AST 27  ALT 21  ALKPHOS 90  BILITOT 1.8*   Hematology Recent Labs  Lab 12/06/20 1600 12/07/20 0406  WBC 9.3 5.7  RBC 5.20 4.43  HGB 15.9 13.5  HCT 50.4 42.3  MCV 96.9 95.5  MCH 30.6 30.5  MCHC 31.5 31.9  RDW 12.7 12.7  PLT 303 237   BNP Recent Labs  Lab 12/06/20 1601  BNP 1,516.1*    DDimer No results for input(s): DDIMER in the last 168 hours.   Radiology/Studies:  DG Chest 2 View  Result Date: 12/06/2020 CLINICAL DATA:  Shortness of breath on exertion. EXAM: CHEST - 2 VIEW COMPARISON:  None. FINDINGS: Mildly increased interstitial lung markings are seen. There is mild elevation of the right hemidiaphragm with a small right pleural  effusion. No pneumothorax is seen. The cardiac silhouette is moderately enlarged. The visualized skeletal structures are unremarkable. IMPRESSION: 1. Cardiomegaly with findings consistent with mild interstitial edema. 2. Small right pleural effusion. Electronically Signed   By: Aram Candelahaddeus  Houston M.D.   On: 12/06/2020 21:45   CT Angio Chest PE W/Cm &/Or Wo Cm  Result Date: 12/06/2020 CLINICAL DATA:  Shortness of breath, leg swelling EXAM: CT ANGIOGRAPHY CHEST WITH  CONTRAST TECHNIQUE: Multidetector CT imaging of the chest was performed using the standard protocol during bolus administration of intravenous contrast. Multiplanar CT image reconstructions and MIPs were obtained to evaluate the vascular anatomy. CONTRAST:  10mL OMNIPAQUE IOHEXOL 350 MG/ML SOLN COMPARISON:  None. FINDINGS: Cardiovascular: Satisfactory opacification of the pulmonary arteries to the segmental level. Limited evaluation at the subsegmental level due to respiratory motion artifact. Filling defect within the right lower lobe segmental and subsegmental pulmonary arteries. The main pulmonary artery is enlarged measuring up to 3.3 cm. Prominent left ventricle. No increased right to left ventricular ratio. No straightening or paradoxical bowing of the interventricular septum. Trace pericardial effusion. The thoracic aorta is normal in caliber. Mediastinum/Nodes: Borderline enlarged prevascular lymph node measuring up to 1.1 cm (7:123). Prominent hilar lymph nodes. No enlarged hilar or axillary lymph nodes. Thyroid gland, trachea, and esophagus demonstrate no significant findings. Lungs/Pleura: Right lower passive lobe atelectasis. Small to moderate volume right pleural effusion. Trace left pleural effusion. Upper Abdomen: Least small volume simple free fluid ascites. Otherwise no acute abnormality visualized. Musculoskeletal: Asymmetric left greater than right gynecomastia. No suspicious lytic or blastic osseous lesions. No acute displaced  fracture. Review of the MIP images confirms the above findings. IMPRESSION: 1. Right lower lobe segmental and subsegmental pulmonary embolus. Limited evaluation due to respiratory motion artifact. No associated right heart strain or pulmonary infarction. Enlarged main pulmonary artery suggesting pulmonary hypertension. 2. Asymmetric gynecomastia. Recommend correlation with prior cross-sectional imaging for stability. If not available, recommend further evaluation with mammogram. 3. Small to moderate volume right pleural effusion and trace left pleural effusion. Associated right lower lobe passive atelectasis. Superimposed infection/inflammation of the right lower lobe not excluded. 4. Likely reactive hilar and mediastinal lymph nodes. 5. Prominent left ventricle. 6. Trace pericardial effusion. 7. At least small volume simple free fluid ascites. These results were called by telephone at the time of interpretation on 12/06/2020 at 11:21 pm to provider [facility] , who verbally acknowledged these results. Electronically Signed   By: Tish Frederickson M.D.   On: 12/06/2020 23:28     Assessment and Plan:   1. Acute CHF Etiology of his congestive heart failure is unclear.  Echocardiogram is still pending.  With hx of alcoholism he is certainly at risk for heart failure with reduced ejection fraction.  We discussed the types of HF and etiologies for both as well as the potential management.  He has a systolic murmur suspicious for MR, so he likely has reduced EF. We will start diuresis with IV furosemide and await echocardiogram.  If EF down, will start GDMT.   -Furosemide 40 mg IV twice daily   -K+ 20 mEq twice daily   -BMET q AM   -Echocardiogram pending  2. Pulmonary embolism On anticoagulation.  Management per primary team.  3. Pulmonary hypertension  Likely related to pulmonary embolism vs sleep apnea.  Echocardiogram pending.  4. Hypertension  Fair control.  Will diurese and follow.  5. Sleep  apnea Continue CPAP.  6. Alcoholism Sober for 1 month.  LFTs, albumin, INR normal.  Abd [place] pending.        New York Heart Association (NYHA) Functional Class NYHA Class III        For questions or updates, please contact [facility] Please consult [email or web address] for contact info under  Signed, Tereso Newcomer, PA-C  12/07/2020 9:38 AM  As above, patient seen and examined.  Briefly he is a 45 year old male with past medical history of hypertension, sleep apnea,  alcoholism admitted with pulmonary embolus for evaluation of acute heart failure.  No prior cardiac history.  Patient traveled to [place] Thanksgiving weekend and 2 weeks ago traveled to [place].  Over the past 4 weeks he has noticed progressive dyspnea on exertion.  He also notices orthopnea but no PND.  Over the past week he has noticed pedal edema and has gained 10 pounds over the past 4 weeks.  He denies fevers, chills, productive cough, hemoptysis.  He presented for further evaluation and cardiology now asked to evaluate. On exam he has loud mitral regurgitation murmur.  He has ascites.  1+ bilateral lower extremity edema. CTA shows right lower lobe pulmonary embolus, right pleural effusion, enlarged pulmonary artery suggestive of pulmonary hypertension, asymmetric gynecomastia and reactive adenopathy. BNP 1516, troponin 27 and 38, TSH 1.733. Electrocardiogram shows sinus tachycardia and right bundle branch block.  1 acute heart failure-patient is volume overloaded on examination.  We will arrange echocardiogram to assess LV function.  Also with mitral regurgitation murmur on examination.  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  We will treat with Lasix 40 mg IV twice daily.  Discontinue atenolol.  If LV function reduced we will begin cardiac medications including ARB versus Entresto and add beta-blocker later.  Follow renal function closely.  2 mitral regurgitation  murmur-noted on examination.  Echocardiogram to further assess.  3 recent pulmonary embolus-continue Lovenox.  Will need to transition to apixaban once all procedures complete.  Patient likely developed a pulmonary embolus due to recent travel.  4 alcohol abuse-patient states he has been sober for 1 month.  5 hypertension-follow blood pressure and treat accordingly.  Will likely need CHF medications which should also treat blood pressure.  5 gynecomastia-Per primary care.  Olga Millers, MD

## 2020-12-07 NOTE — Plan of Care (Addendum)
At 18:30 RN notified by CCMD that HR at this time is sustained at 120s-130s. Pt denies chest pain, no palpitations or SOB, not in distress and he states he feels fine. Dr. Dayna Barker made aware. Called cardiologist office to update, waiting for a callback from MD. Endorsed accordingly to oncoming RN.  Problem: Clinical Measurements: Goal: Ability to maintain clinical measurements within normal limits will improve Outcome: Progressing   Problem: Activity: Goal: Risk for activity intolerance will decrease Outcome: Progressing   Problem: Nutrition: Goal: Adequate nutrition will be maintained Outcome: Progressing   Problem: Coping: Goal: Level of anxiety will decrease Outcome: Progressing   Problem: Elimination: Goal: Will not experience complications related to bowel motility Outcome: Progressing   Problem: Pain Managment: Goal: General experience of comfort will improve Outcome: Progressing   Problem: Safety: Goal: Ability to remain free from injury will improve Outcome: Progressing

## 2020-12-07 NOTE — Progress Notes (Signed)
  Patient seen and examined personally, I reviewed the chart, history and physical and admission note, done by admitting physician this morning and agree with the same with following addendum.  Please refer to the morning admission note for more detailed plan of care.  He c/o DOE, currently on 2l [place] leg edemea last wt 240s- normally in 220 and noticed more abdomen distension and wt going down now and "belly going down" Bilateral gynecomastia left slightly bigger  Briefly, 45 year old male with anxiety/depression presented to the ED with shortness of breath over 3 weeks, seen in the ED COVID-19 negative work-up reviewed right lower lobe segmental and subsegmental pulmonary embolism with no heart strain also with right-sided pleural effusion with associated atelectasis, likely reactive hilar and mediastinal lymph nodes, prominent left ventricle, trace pericardial effusion, asymmetric gynecomastia, small volume simple free fluid ascites. Labs with no leukocytosis but significantly elevated proBNP 1516, troponin> 27>38. Placed on Lovenox and admitted.  Patient will be treated for acute CHF, acute hypoxic respiratory failure, acute pulm embolism, pulmonary hypertension, hypertension, sleep apnea, bilateral gynecomastia, alcoholism Pending further work-up with duplex of the leg, echocardiogram, US abdomen. Check procalcitonin level- is normal 0.15, repeat bnp.cardiology consult. Will cont diureses, lovenox anticoagulation, follow-up further work-up Patient is on 2l [place] spo2 at 92%.

## 2020-12-08 ENCOUNTER — Inpatient Hospital Stay (HOSPITAL_COMMUNITY): Payer: 59

## 2020-12-08 DIAGNOSIS — I5021 Acute systolic (congestive) heart failure: Secondary | ICD-10-CM | POA: Diagnosis not present

## 2020-12-08 LAB — BASIC METABOLIC PANEL
Anion gap: 17 — ABNORMAL HIGH (ref 5–15)
BUN: 14 mg/dL (ref 6–20)
CO2: 23 mmol/L (ref 22–32)
Calcium: 8.4 mg/dL — ABNORMAL LOW (ref 8.9–10.3)
Chloride: 99 mmol/L (ref 98–111)
Creatinine, Ser: 1.05 mg/dL (ref 0.61–1.24)
GFR, Estimated: >60 mL/min (ref >60)
Glucose, Bld: 117 mg/dL — ABNORMAL HIGH (ref 70–99)
Potassium: 3.2 mmol/L — ABNORMAL LOW (ref 3.5–5.1)
Sodium: 139 mmol/L (ref 135–145)

## 2020-12-08 LAB — MAGNESIUM
Magnesium: 1.4 mg/dL — ABNORMAL LOW (ref 1.7–2.4)
Magnesium: 1.5 mg/dL — ABNORMAL LOW (ref 1.7–2.4)

## 2020-12-08 LAB — HEMOGLOBIN A1C
Hgb A1c MFr Bld: 7.3 % — ABNORMAL HIGH (ref 4.8–5.6)
Mean Plasma Glucose: 162.81 mg/dL

## 2020-12-08 MED ORDER — DIGOXIN 125 MCG PO TABS
0.1250 mg | ORAL_TABLET | Freq: Every day | ORAL | Status: DC
  Administered 2020-12-08 – 2020-12-11 (×4): 0.125 mg via ORAL
  Filled 2020-12-08 (×4): qty 1

## 2020-12-08 MED ORDER — SPIRONOLACTONE 12.5 MG HALF TABLET
12.5000 mg | ORAL_TABLET | Freq: Every day | ORAL | Status: DC
  Administered 2020-12-08 – 2020-12-11 (×4): 12.5 mg via ORAL
  Filled 2020-12-08 (×4): qty 1

## 2020-12-08 MED ORDER — CARVEDILOL 3.125 MG PO TABS
3.1250 mg | ORAL_TABLET | Freq: Two times a day (BID) | ORAL | Status: DC
  Administered 2020-12-08 – 2020-12-09 (×2): 3.125 mg via ORAL
  Filled 2020-12-08 (×2): qty 1

## 2020-12-08 MED ORDER — LOSARTAN POTASSIUM 50 MG PO TABS
50.0000 mg | ORAL_TABLET | Freq: Every day | ORAL | Status: DC
  Administered 2020-12-08 – 2020-12-10 (×3): 50 mg via ORAL
  Filled 2020-12-08 (×3): qty 1

## 2020-12-08 NOTE — Progress Notes (Signed)
   12/08/20 1000  Assess: MEWS Score  Temp 98.1 F (36.7 C)  BP (!) 141/101 (RN notified)  Pulse Rate (!) 126 (RN notified)  Resp 20  Level of Consciousness Alert  SpO2 91 %  O2 Device Nasal Cannula  Assess: MEWS Score  MEWS Temp 0  MEWS Systolic 0  MEWS Pulse 2  MEWS RR 0  MEWS LOC 0  MEWS Score 2  MEWS Score Color Yellow  Assess: if the MEWS score is Yellow or Red  Were vital signs taken at a resting state? Yes  Focused Assessment No change from prior assessment  Early Detection of Sepsis Score *See Row Information* Low  MEWS guidelines implemented *See Row Information* No, previously yellow, continue vital signs every 4 hours

## 2020-12-08 NOTE — Progress Notes (Signed)
PROGRESS NOTE    Gregory Stevenson  AOZ:[id number] DOB: 03-02-1976 DOA: 12/06/2020 PCP: Pcp, No   Chief Complaint  Patient presents with  . Shortness of Breath  . Leg Swelling  . Abdominal Distention     Brief Narrative: 45 year old male with anxiety/depression presented to the ED with shortness of breath over 3 weeks, seen in the ED COVID-19 negative work-up reviewed right lower lobe segmental and subsegmental pulmonary embolism with no heart strain also with right-sided pleural effusion with associated atelectasis, likely reactive hilar and mediastinal lymph nodes, prominent left ventricle, trace pericardial effusion, asymmetric gynecomastia, small volume simple free fluid ascites. Labs with no leukocytosis but significantly elevated proBNP 1516, troponin> 27>38. Placed on Lovenox and admitted Cardiology was consulted Patient endorsed last wt 240s- normally in 220 and noticed more abdomen distension and wt going down now and "belly going down"  Subjective:  Overnight no acute events, breathing much better leg swelling is improved. heart rate in 100-120s, blood pressure 120s Good diuresis overnight No new complaints   Assessment & Plan:  Acute systolic congestive heart failure with pleural effusion: New onset with EF 30 to 35% on echocardiogram, likely from alcohol abuse history per cardiology continue with IV Lasix, Aldactone, losartan, digoxin and carvedilol.  Entresto if BP allows.  Having good urine output, clinically improving, continue with daily intake and output, weight and renal function monitoring.  Cardiologist planning for CT as an outpatient after PE has been treated Net IO Since Admission: -4,280 mL [12/08/20 0834]   Intake/Output Summary (Last 24 hours) at 12/08/2020 0834 Last data filed at 12/08/2020 0500 Gross per 24 hour  Intake 240 ml  Output 4520 ml  Net -4280 ml   Acute hypoxic respiratory failure: Due to CHF/PE, pleural effusion, continue oxygen wean as tolerated.   Continue to treat underlying etiology  Recent pulmonary embolism: Likely from recent travel to [place] in [place], continue Lovenox will transition to Eliquis -soon likely expected duration 6 months.  Hypertension adjusting medication along with CHF for goal-directed therapy.  Pulmonary hypertension: Noted in the CT scan, echo shows moderate pulmonary hypertension.  Hypokalemia: Replete p.o.  Bilateral gynecomastia seen in the CTA, has severe alcohol abuse likely the etiology, it is bilateral, discussed that he needs to get a mammogram as outpatient from his PCP he understands.  History of alcoholism, patient has been sober for 1 month as per him.  He has been counseled to avoid alcohol  Anxiety disorder on Paxil. Home atebolol-discontinued.    Morbid obesity BMI 32, outpatient follow-up for weight loss and also sleep apnea evaluation.  We will check a lipid panel fasting and also hemoglobin A1c  Nutrition: Diet Order            Diet Heart Room service appropriate? Yes; Fluid consistency: Thin  Diet effective now                Body mass index is 32.01 kg/m.  DVT prophylaxis: lovenox Code Status:   Code Status: Full Code  Family Communication: plan of care discussed with patient at bedside.  Status is: Inpatient Remains inpatient appropriate because:IV treatments appropriate due to intensity of illness or inability to take PO and Inpatient level of care appropriate due to severity of illness  Dispo: The patient is from: Home              Anticipated d/c is to: Home              Anticipated d/c date is:  2 days              Patient currently is not medically stable to d/c.  Consultants:see note  Procedures:see note  Culture/Microbiology No results found for: SDES, SPECREQUEST, CULT, REPTSTATUS  Other culture-see note  Medications: Scheduled Meds: . carvedilol  3.125 mg Oral BID WC  . digoxin  0.125 mg Oral Daily  . enoxaparin (LOVENOX) injection  110 mg  Subcutaneous BID  . furosemide  40 mg Intravenous BID  . influenza vac split quadrivalent PF  0.5 mL Intramuscular Tomorrow-1000  . losartan  50 mg Oral Daily  . PARoxetine  20 mg Oral Daily  . potassium chloride  20 mEq Oral BID  . spironolactone  12.5 mg Oral Daily   Continuous Infusions:  Antimicrobials: Anti-infectives (From admission, onward)   None     Objective: Vitals: Today's Vitals   12/07/20 2145 12/08/20 0008 12/08/20 0015 12/08/20 0511  BP:  (!) 120/96  (!) 120/99  Pulse:  (!) 122  (!) 109  Resp:  16  18  Temp:  98.8 F (37.1 C)  97.7 F (36.5 C)  TempSrc:  Oral  Oral  SpO2:  97%  99%  Weight:      Height:      PainSc: 0-No pain 0-No pain 0-No pain     Intake/Output Summary (Last 24 hours) at 12/08/2020 0831 Last data filed at 12/08/2020 0500 Gross per 24 hour  Intake 240 ml  Output 4520 ml  Net -4280 ml   Filed Weights   12/06/20 1556  Weight: 107 kg   Weight change:   Intake/Output from previous day: 01/15 0701 - 01/16 0700 In: 240 [P.O.:240] Out: 4520 [Urine:4520] Intake/Output this shift: No intake/output data recorded.  Examination: General exam: AAOx3 ,NAD, weak appearing. HEENT:Oral mucosa moist, Ear/Nose WNL grossly,dentition normal. Respiratory system: bilaterally crackles at base,no use of accessory muscle, non tender. Cardiovascular system: S1 & S2 +, regular, No JVD. Gastrointestinal system: Abdomen soft, NT,ND, BS+. Nervous System:Alert, awake, moving extremities and grossly nonfocal Extremities: No edema, distal peripheral pulses palpable.  Skin: No rashes,no icterus. MSK: Normal muscle bulk,tone, power  Data Reviewed: I have personally reviewed following labs and imaging studies CBC: Recent Labs  Lab 12/06/20 1600 12/07/20 0406  WBC 9.3 5.7  HGB 15.9 13.5  HCT 50.4 42.3  MCV 96.9 95.5  PLT 303 237   Basic Metabolic Panel: Recent Labs  Lab 12/06/20 1600 12/08/20 0207  NA 139 139  K 3.5 3.2*  CL 101 99  CO2 23  23  GLUCOSE 162* 117*  BUN 14 14  CREATININE 1.17 1.05  CALCIUM 9.1 8.4*   GFR: Estimated Creatinine Clearance: 111.2 mL/min (by C-G formula based on SCr of 1.05 mg/dL). Liver Function Tests: Recent Labs  Lab 12/06/20 1600  AST 27  ALT 21  ALKPHOS 90  BILITOT 1.8*  PROT 6.9  ALBUMIN 3.8   Recent Labs  Lab 12/06/20 1600  LIPASE 44   No results for input(s): AMMONIA in the last 168 hours. Coagulation Profile: Recent Labs  Lab 12/06/20 1600  INR 1.3*   Cardiac Enzymes: No results for input(s): CKTOTAL, CKMB, CKMBINDEX, TROPONINI in the last 168 hours. BNP (last 3 results) No results for input(s): PROBNP in the last 8760 hours. HbA1C: No results for input(s): HGBA1C in the last 72 hours. CBG: No results for input(s): GLUCAP in the last 168 hours. Lipid Profile: No results for input(s): CHOL, HDL, LDLCALC, TRIG, CHOLHDL, LDLDIRECT in the last 72 hours.  Thyroid Function Tests: Recent Labs    12/07/20 0406  TSH 1.733   Anemia Panel: No results for input(s): VITAMINB12, FOLATE, FERRITIN, TIBC, IRON, RETICCTPCT in the last 72 hours. Sepsis Labs: Recent Labs  Lab 12/07/20 0406  PROCALCITON 0.15    Recent Results (from the past 240 hour(s))  Resp Panel by RT-PCR (Flu A&B, Covid) Nasopharyngeal Swab     Status: None   Collection Time: 12/07/20 12:40 AM   Specimen: Nasopharyngeal Swab; Nasopharyngeal(NP) swabs in vial transport medium  Result Value Ref Range Status   SARS Coronavirus 2 by RT PCR NEGATIVE NEGATIVE Final    Comment: (NOTE) SARS-CoV-2 target nucleic acids are NOT DETECTED.  The SARS-CoV-2 RNA is generally detectable in upper respiratory specimens during the acute phase of infection. The lowest concentration of SARS-CoV-2 viral copies this assay can detect is 138 copies/mL. A negative result does not preclude SARS-Cov-2 infection and should not be used as the sole basis for treatment or other patient management decisions. A negative result may  occur with  improper specimen collection/handling, submission of specimen other than nasopharyngeal swab, presence of viral mutation(s) within the areas targeted by this assay, and inadequate number of viral copies(<138 copies/mL). A negative result must be combined with clinical observations, patient history, and epidemiological information. The expected result is Negative.  Fact Sheet for Patients:  BloggerCourse.com  Fact Sheet for Healthcare Providers:  SeriousBroker.it  This test is no t yet approved or cleared by the [place] FDA and  has been authorized for detection and/or diagnosis of SARS-CoV-2 by FDA under an Emergency Use Authorization (EUA). This EUA will remain  in effect (meaning this test can be used) for the duration of the COVID-19 declaration under Section 564(b)(1) of the Act, 21 U.S.C.section 360bbb-3(b)(1), unless the authorization is terminated  or revoked sooner.       Influenza A by PCR NEGATIVE NEGATIVE Final   Influenza B by PCR NEGATIVE NEGATIVE Final    Comment: (NOTE) The Xpert Xpress SARS-CoV-2/FLU/RSV plus assay is intended as an aid in the diagnosis of influenza from Nasopharyngeal swab specimens and should not be used as a sole basis for treatment. Nasal washings and aspirates are unacceptable for Xpert Xpress SARS-CoV-2/FLU/RSV testing.  Fact Sheet for Patients: BloggerCourse.com  Fact Sheet for Healthcare Providers: SeriousBroker.it  This test is not yet approved or cleared by the [place] FDA and has been authorized for detection and/or diagnosis of SARS-CoV-2 by FDA under an Emergency Use Authorization (EUA). This EUA will remain in effect (meaning this test can be used) for the duration of the COVID-19 declaration under Section 564(b)(1) of the Act, 21 U.S.C. section 360bbb-3(b)(1), unless the authorization is terminated  or revoked.  Performed at [facility], [address], [place], [place] [address]      Radiology Studies: DG Chest 2 View  Result Date: 12/06/2020 CLINICAL DATA:  Shortness of breath on exertion. EXAM: CHEST - 2 VIEW COMPARISON:  None. FINDINGS: Mildly increased interstitial lung markings are seen. There is mild elevation of the right hemidiaphragm with a small right pleural effusion. No pneumothorax is seen. The cardiac silhouette is moderately enlarged. The visualized skeletal structures are unremarkable. IMPRESSION: 1. Cardiomegaly with findings consistent with mild interstitial edema. 2. Small right pleural effusion. Electronically Signed   By: Aram Candela M.D.   On: 12/06/2020 21:45   CT Angio Chest PE W/Cm &/Or Wo Cm  Result Date: 12/06/2020 CLINICAL DATA:  Shortness of breath, leg swelling EXAM: CT  ANGIOGRAPHY CHEST WITH CONTRAST TECHNIQUE: Multidetector CT imaging of the chest was performed using the standard protocol during bolus administration of intravenous contrast. Multiplanar CT image reconstructions and MIPs were obtained to evaluate the vascular anatomy. CONTRAST:  80mL OMNIPAQUE IOHEXOL 350 MG/ML SOLN COMPARISON:  None. FINDINGS: Cardiovascular: Satisfactory opacification of the pulmonary arteries to the segmental level. Limited evaluation at the subsegmental level due to respiratory motion artifact. Filling defect within the right lower lobe segmental and subsegmental pulmonary arteries. The main pulmonary artery is enlarged measuring up to 3.3 cm. Prominent left ventricle. No increased right to left ventricular ratio. No straightening or paradoxical bowing of the interventricular septum. Trace pericardial effusion. The thoracic aorta is normal in caliber. Mediastinum/Nodes: Borderline enlarged prevascular lymph node measuring up to 1.1 cm (7:123). Prominent hilar lymph nodes. No enlarged hilar or axillary lymph nodes. Thyroid gland, trachea, and esophagus demonstrate no  significant findings. Lungs/Pleura: Right lower passive lobe atelectasis. Small to moderate volume right pleural effusion. Trace left pleural effusion. Upper Abdomen: Least small volume simple free fluid ascites. Otherwise no acute abnormality visualized. Musculoskeletal: Asymmetric left greater than right gynecomastia. No suspicious lytic or blastic osseous lesions. No acute displaced fracture. Review of the MIP images confirms the above findings. IMPRESSION: 1. Right lower lobe segmental and subsegmental pulmonary embolus. Limited evaluation due to respiratory motion artifact. No associated right heart strain or pulmonary infarction. Enlarged main pulmonary artery suggesting pulmonary hypertension. 2. Asymmetric gynecomastia. Recommend correlation with prior cross-sectional imaging for stability. If not available, recommend further evaluation with mammogram. 3. Small to moderate volume right pleural effusion and trace left pleural effusion. Associated right lower lobe passive atelectasis. Superimposed infection/inflammation of the right lower lobe not excluded. 4. Likely reactive hilar and mediastinal lymph nodes. 5. Prominent left ventricle. 6. Trace pericardial effusion. 7. At least small volume simple free fluid ascites. These results were called by telephone at the time of interpretation on 12/06/2020 at 11:21 pm to provider [facility] , who verbally acknowledged these results. Electronically Signed   By: Tish Frederickson M.D.   On: 12/06/2020 23:28   US Abdomen Complete  Result Date: 12/07/2020 CLINICAL DATA:  Abdominal pain and difficulty breathing. EXAM: ABDOMEN ULTRASOUND COMPLETE COMPARISON:  None. FINDINGS: Gallbladder: No gallstones or adjacent free fluid. Gallbladder wall measures 4 mm which is nonspecific in the setting of ascites. No sonographic Murphy sign noted by sonographer. Common bile duct: Diameter: 3 mm. Liver: Mild nodular contour to liver suggesting a degree of cirrhosis without focal  mass. Within normal limits in parenchymal echogenicity. Portal vein is patent on color Doppler imaging with normal direction of blood flow towards the liver. IVC: No abnormality visualized. Pancreas: Visualized portion unremarkable. Spleen: Size and appearance within normal limits. Right Kidney: Length: 10.9 cm. Echogenicity within normal limits. No mass or hydronephrosis visualized. Left Kidney: Length: 10.8 cm. Echogenicity within normal limits. No mass or hydronephrosis visualized. Abdominal aorta: No aneurysm visualized. Other findings: Mild ascites with mild-to-moderate fluid noted over the right upper and left upper quadrants. IMPRESSION: 1.  No acute findings. 2.  Findings suggesting a degree of cirrhosis with mild ascites. Electronically Signed   By: Elberta Fortis M.D.   On: 12/07/2020 10:59   ECHOCARDIOGRAM COMPLETE  Result Date: 12/07/2020    ECHOCARDIOGRAM REPORT   Patient Name:   Gregory Stevenson Date of Exam: 12/07/2020 Medical Rec #:  [id number]   Height:       72.0 in Accession #:    [id number]  Weight:  236.0 lb Date of Birth:  Sep 29, 1976   BSA:          2.286 m Patient Age:    44 years    BP:           127/90 mmHg Patient Gender: M           HR:           120 bpm. Exam Location:  Inpatient Procedure: 2D Echo, Color Doppler and Cardiac Doppler Indications:    I26.02 Pulmonary embolus  History:        Patient has no prior history of Echocardiogram examinations.                 Risk Factors:Hypertension, Sleep Apnea and ETOH.  Sonographer:    Irving BurtonEmily Senior RDCS Referring Phys: [id number] ARSHAD N KAKRAKANDY IMPRESSIONS  1. Diffuse hypokinesis worse in septum /apex with abnormal septal motion . Left ventricular ejection fraction, by estimation, is 30 to 35%. The left ventricle has moderately decreased function. The left ventricle has no regional wall motion abnormalities. The left ventricular internal cavity size was mildly dilated. Left ventricular diastolic parameters are indeterminate.  2. Right  ventricular systolic function is mildly reduced. The right ventricular size is mildly enlarged. There is moderately elevated pulmonary artery systolic pressure.  3. Left atrial size was mildly dilated.  4. The mitral valve is normal in structure. Mild mitral valve regurgitation. No evidence of mitral stenosis.  5. The aortic valve is tricuspid. Aortic valve regurgitation is not visualized. No aortic stenosis is present.  6. The inferior vena cava is dilated in size with >50% respiratory variability, suggesting right atrial pressure of 8 mmHg. FINDINGS  Left Ventricle: Diffuse hypokinesis worse in septum /apex with abnormal septal motion. Left ventricular ejection fraction, by estimation, is 30 to 35%. The left ventricle has moderately decreased function. The left ventricle has no regional wall motion abnormalities. The left ventricular internal cavity size was mildly dilated. There is no left ventricular hypertrophy. Left ventricular diastolic parameters are indeterminate. Right Ventricle: The right ventricular size is mildly enlarged. No increase in right ventricular wall thickness. Right ventricular systolic function is mildly reduced. There is moderately elevated pulmonary artery systolic pressure. The tricuspid regurgitant velocity is 2.99 m/s, and with an assumed right atrial pressure of 15 mmHg, the estimated right ventricular systolic pressure is 50.8 mmHg. Left Atrium: Left atrial size was mildly dilated. Right Atrium: Right atrial size was normal in size. Pericardium: There is no evidence of pericardial effusion. Mitral Valve: The mitral valve is normal in structure. There is mild thickening of the mitral valve leaflet(s). There is mild calcification of the mitral valve leaflet(s). Mild mitral valve regurgitation. No evidence of mitral valve stenosis. Tricuspid Valve: The tricuspid valve is normal in structure. Tricuspid valve regurgitation is mild . No evidence of tricuspid stenosis. Aortic Valve: The  aortic valve is tricuspid. Aortic valve regurgitation is not visualized. No aortic stenosis is present. Pulmonic Valve: The pulmonic valve was normal in structure. Pulmonic valve regurgitation is not visualized. No evidence of pulmonic stenosis. Aorta: The aortic root is normal in size and structure. Venous: The inferior vena cava is dilated in size with greater than 50% respiratory variability, suggesting right atrial pressure of 8 mmHg. IAS/Shunts: No atrial level shunt detected by color flow Doppler.  LEFT VENTRICLE PLAX 2D LVIDd:         7.10 cm LVIDs:         5.60 cm LV PW:  0.90 cm LV IVS:        0.70 cm LVOT diam:     1.80 cm LV SV:         34 LV SV Index:   15 LVOT Area:     2.54 cm  LV Volumes (MOD) LV vol d, MOD A2C: 241.0 ml LV vol d, MOD A4C: 191.0 ml LV vol s, MOD A2C: 196.0 ml LV vol s, MOD A4C: 126.0 ml LV SV MOD A2C:     45.0 ml LV SV MOD A4C:     191.0 ml LV SV MOD BP:      60.5 ml RIGHT VENTRICLE RV S prime:     9.90 cm/s TAPSE (M-mode): 1.2 cm LEFT ATRIUM              Index       RIGHT ATRIUM           Index LA diam:        3.90 cm  1.71 cm/m  RA Area:     24.10 cm LA Vol (A2C):   103.0 ml 45.06 ml/m RA Volume:   79.10 ml  34.60 ml/m LA Vol (A4C):   49.6 ml  21.70 ml/m LA Biplane Vol: 73.5 ml  32.15 ml/m  AORTIC VALVE LVOT Vmax:   102.00 cm/s LVOT Vmean:  76.100 cm/s LVOT VTI:    0.132 m  AORTA Ao Root diam: 3.20 cm Ao Asc diam:  3.40 cm MITRAL VALVE                 TRICUSPID VALVE MV Area (PHT): 4.31 cm      TR Peak grad:   35.8 mmHg MV Decel Time: 176 msec      TR Vmax:        299.00 cm/s MR Peak grad:    80.3 mmHg MR Mean grad:    50.0 mmHg   SHUNTS MR Vmax:         448.00 cm/s Systemic VTI:  0.13 m MR Vmean:        329.0 cm/s  Systemic Diam: 1.80 cm MR PISA:         1.01 cm MR PISA Eff ROA: 9 mm MR PISA Radius:  0.40 cm MV E velocity: 103.00 cm/s Charlton Haws MD Electronically signed by Charlton Haws MD Signature Date/Time: 12/07/2020/2:43:13 PM    Final    VAS [place] LOWER  EXTREMITY VENOUS (DVT)  Result Date: 12/07/2020  Lower Venous DVT Study Indications: Edema, and pulmonary embolism.  Limitations: Significant edema. Comparison Study: No prior study on file Performing Technologist: Sherren Kerns RVS  Examination Guidelines: A complete evaluation includes B-mode imaging, spectral Doppler, color Doppler, and power Doppler as needed of all accessible portions of each vessel. Bilateral testing is considered an integral part of a complete examination. Limited examinations for reoccurring indications may be performed as noted. The reflux portion of the exam is performed with the patient in reverse Trendelenburg.  +---------+---------------+---------+-----------+----------+--------------+ RIGHT    CompressibilityPhasicitySpontaneityPropertiesThrombus Aging +---------+---------------+---------+-----------+----------+--------------+ CFV      Full                                         pulsatile flow +---------+---------------+---------+-----------+----------+--------------+ SFJ      Full                                                        +---------+---------------+---------+-----------+----------+--------------+  FV Prox  Full                                                        +---------+---------------+---------+-----------+----------+--------------+ FV Mid   Full                                                        +---------+---------------+---------+-----------+----------+--------------+ FV DistalFull                                                        +---------+---------------+---------+-----------+----------+--------------+ PFV      Full                                                        +---------+---------------+---------+-----------+----------+--------------+ POP      Full                                         pulsatile flow +---------+---------------+---------+-----------+----------+--------------+ PTV       Full                                                        +---------+---------------+---------+-----------+----------+--------------+ PERO     Full                                                        +---------+---------------+---------+-----------+----------+--------------+   +---------+---------------+---------+-----------+----------+--------------+ LEFT     CompressibilityPhasicitySpontaneityPropertiesThrombus Aging +---------+---------------+---------+-----------+----------+--------------+ CFV      Full                                         pulsatile flow +---------+---------------+---------+-----------+----------+--------------+ SFJ      Full                                                        +---------+---------------+---------+-----------+----------+--------------+ FV Prox  Full                                                        +---------+---------------+---------+-----------+----------+--------------+  FV Mid   Full                                                        +---------+---------------+---------+-----------+----------+--------------+ FV DistalFull                                                        +---------+---------------+---------+-----------+----------+--------------+ PFV      Full                                                        +---------+---------------+---------+-----------+----------+--------------+ POP      Full                                         pulsatile flow +---------+---------------+---------+-----------+----------+--------------+ PTV      Full                                                        +---------+---------------+---------+-----------+----------+--------------+ PERO     Full                                                        +---------+---------------+---------+-----------+----------+--------------+     Summary: RIGHT: - There is no evidence of deep  vein thrombosis in the lower extremity.  Pulsatile waveforms, consistent with fluid overload, are noted. Interstitial edema noted throughout  LEFT: - There is no evidence of deep vein thrombosis in the lower extremity.  - Pulsatile waveforms, consistent with fluid overload, are noted. Interstitial edema noted throughout.  *See table(s) above for measurements and observations. Electronically signed by Lemar Livings MD on 12/07/2020 at 11:06:20 PM.    Final      LOS: 1 day   Lanae Boast, MD [facility]  12/08/2020, 8:31 AM

## 2020-12-08 NOTE — Plan of Care (Addendum)
  Problem: Education: Goal: Knowledge of General Education information will improve Description Including pain rating scale, medication(s)/side effects and non-pharmacologic comfort measures Outcome: Progressing   Problem: Health Behavior/Discharge Planning: Goal: Ability to manage health-related needs will improve Outcome: Progressing   

## 2020-12-08 NOTE — Plan of Care (Signed)
°  Problem: Education: °Goal: Knowledge of General Education information will improve °Description: Including pain rating scale, medication(s)/side effects and non-pharmacologic comfort measures °Outcome: Progressing °  °Problem: Health Behavior/Discharge Planning: °Goal: Ability to manage health-related needs will improve °Outcome: Progressing °  °Problem: Clinical Measurements: °Goal: Ability to maintain clinical measurements within normal limits will improve °Outcome: Progressing °  °Problem: Activity: °Goal: Risk for activity intolerance will decrease °Outcome: Progressing °  °Problem: Coping: °Goal: Level of anxiety will decrease °Outcome: Progressing °  °Problem: Safety: °Goal: Ability to remain free from injury will improve °Outcome: Progressing °  °Problem: Skin Integrity: °Goal: Risk for impaired skin integrity will decrease °Outcome: Progressing °  °

## 2020-12-08 NOTE — Progress Notes (Signed)
Progress Note  Patient Name: Gregory Stevenson Date of Encounter: 12/08/2020  [facility] Cardiologist: Olga Millers, MD   Subjective   No CP; dyspnea improving  Inpatient Medications    Scheduled Meds: . enoxaparin (LOVENOX) injection  110 mg Subcutaneous BID  . furosemide  40 mg Intravenous BID  . influenza vac split quadrivalent PF  0.5 mL Intramuscular Tomorrow-1000  . PARoxetine  20 mg Oral Daily  . potassium chloride  20 mEq Oral BID   Continuous Infusions:  PRN Meds: acetaminophen **OR** acetaminophen   Vital Signs    Vitals:   12/07/20 1743 12/07/20 1959 12/08/20 0008 12/08/20 0511  BP: (!) 123/98 (!) 126/93 (!) 120/96 (!) 120/99  Pulse: (!) 122 (!) 124 (!) 122 (!) 109  Resp: 16 18 16 18   Temp: 98.1 F (36.7 C) 98.2 F (36.8 C) 98.8 F (37.1 C) 97.7 F (36.5 C)  TempSrc: Oral Oral Oral Oral  SpO2: 100% 96% 97% 99%  Weight:      Height:        Intake/Output Summary (Last 24 hours) at 12/08/2020 0806 Last data filed at 12/08/2020 0500 Gross per 24 hour  Intake 240 ml  Output 4520 ml  Net -4280 ml   Last 3 Weights 12/06/2020  Weight (lbs) 236 lb  Weight (kg) 107.049 kg      Telemetry    Sinus to sinus tach- Personally Reviewed   Physical Exam   GEN: No acute distress.   Neck: supple Cardiac: RRR, + gallop Respiratory: Clear to auscultation bilaterally. GI: Soft, nontender, non-distended  MS: trace edema Neuro:  Nonfocal  Psych: Normal affect   Labs    High Sensitivity Troponin:   Recent Labs  Lab 12/07/20 0040 12/07/20 0406  TROPONINIHS 27* 38*      Chemistry Recent Labs  Lab 12/06/20 1600 12/08/20 0207  NA 139 139  K 3.5 3.2*  CL 101 99  CO2 23 23  GLUCOSE 162* 117*  BUN 14 14  CREATININE 1.17 1.05  CALCIUM 9.1 8.4*  PROT 6.9  --   ALBUMIN 3.8  --   AST 27  --   ALT 21  --   ALKPHOS 90  --   BILITOT 1.8*  --   GFRNONAA >60 >60  ANIONGAP 15 17*     Hematology Recent Labs  Lab 12/06/20 1600 12/07/20 0406   WBC 9.3 5.7  RBC 5.20 4.43  HGB 15.9 13.5  HCT 50.4 42.3  MCV 96.9 95.5  MCH 30.6 30.5  MCHC 31.5 31.9  RDW 12.7 12.7  PLT 303 237    BNP Recent Labs  Lab 12/06/20 1601  BNP 1,516.1*     Radiology    DG Chest 2 View  Result Date: 12/06/2020 CLINICAL DATA:  Shortness of breath on exertion. EXAM: CHEST - 2 VIEW COMPARISON:  None. FINDINGS: Mildly increased interstitial lung markings are seen. There is mild elevation of the right hemidiaphragm with a small right pleural effusion. No pneumothorax is seen. The cardiac silhouette is moderately enlarged. The visualized skeletal structures are unremarkable. IMPRESSION: 1. Cardiomegaly with findings consistent with mild interstitial edema. 2. Small right pleural effusion. Electronically Signed   By: 12/08/2020 M.D.   On: 12/06/2020 21:45   CT Angio Chest PE W/Cm &/Or Wo Cm  Result Date: 12/06/2020 CLINICAL DATA:  Shortness of breath, leg swelling EXAM: CT ANGIOGRAPHY CHEST WITH CONTRAST TECHNIQUE: Multidetector CT imaging of the chest was performed using the standard protocol during bolus administration of  intravenous contrast. Multiplanar CT image reconstructions and MIPs were obtained to evaluate the vascular anatomy. CONTRAST:  26mL OMNIPAQUE IOHEXOL 350 MG/ML SOLN COMPARISON:  None. FINDINGS: Cardiovascular: Satisfactory opacification of the pulmonary arteries to the segmental level. Limited evaluation at the subsegmental level due to respiratory motion artifact. Filling defect within the right lower lobe segmental and subsegmental pulmonary arteries. The main pulmonary artery is enlarged measuring up to 3.3 cm. Prominent left ventricle. No increased right to left ventricular ratio. No straightening or paradoxical bowing of the interventricular septum. Trace pericardial effusion. The thoracic aorta is normal in caliber. Mediastinum/Nodes: Borderline enlarged prevascular lymph node measuring up to 1.1 cm (7:123). Prominent hilar lymph  nodes. No enlarged hilar or axillary lymph nodes. Thyroid gland, trachea, and esophagus demonstrate no significant findings. Lungs/Pleura: Right lower passive lobe atelectasis. Small to moderate volume right pleural effusion. Trace left pleural effusion. Upper Abdomen: Least small volume simple free fluid ascites. Otherwise no acute abnormality visualized. Musculoskeletal: Asymmetric left greater than right gynecomastia. No suspicious lytic or blastic osseous lesions. No acute displaced fracture. Review of the MIP images confirms the above findings. IMPRESSION: 1. Right lower lobe segmental and subsegmental pulmonary embolus. Limited evaluation due to respiratory motion artifact. No associated right heart strain or pulmonary infarction. Enlarged main pulmonary artery suggesting pulmonary hypertension. 2. Asymmetric gynecomastia. Recommend correlation with prior cross-sectional imaging for stability. If not available, recommend further evaluation with mammogram. 3. Small to moderate volume right pleural effusion and trace left pleural effusion. Associated right lower lobe passive atelectasis. Superimposed infection/inflammation of the right lower lobe not excluded. 4. Likely reactive hilar and mediastinal lymph nodes. 5. Prominent left ventricle. 6. Trace pericardial effusion. 7. At least small volume simple free fluid ascites. These results were called by telephone at the time of interpretation on 12/06/2020 at 11:21 pm to provider [facility] , who verbally acknowledged these results. Electronically Signed   By: Tish Frederickson M.D.   On: 12/06/2020 23:28   US Abdomen Complete  Result Date: 12/07/2020 CLINICAL DATA:  Abdominal pain and difficulty breathing. EXAM: ABDOMEN ULTRASOUND COMPLETE COMPARISON:  None. FINDINGS: Gallbladder: No gallstones or adjacent free fluid. Gallbladder wall measures 4 mm which is nonspecific in the setting of ascites. No sonographic Murphy sign noted by sonographer. Common bile  duct: Diameter: 3 mm. Liver: Mild nodular contour to liver suggesting a degree of cirrhosis without focal mass. Within normal limits in parenchymal echogenicity. Portal vein is patent on color Doppler imaging with normal direction of blood flow towards the liver. IVC: No abnormality visualized. Pancreas: Visualized portion unremarkable. Spleen: Size and appearance within normal limits. Right Kidney: Length: 10.9 cm. Echogenicity within normal limits. No mass or hydronephrosis visualized. Left Kidney: Length: 10.8 cm. Echogenicity within normal limits. No mass or hydronephrosis visualized. Abdominal aorta: No aneurysm visualized. Other findings: Mild ascites with mild-to-moderate fluid noted over the right upper and left upper quadrants. IMPRESSION: 1.  No acute findings. 2.  Findings suggesting a degree of cirrhosis with mild ascites. Electronically Signed   By: Elberta Fortis M.D.   On: 12/07/2020 10:59   ECHOCARDIOGRAM COMPLETE  Result Date: 12/07/2020    ECHOCARDIOGRAM REPORT   Patient Name:   Gregory Stevenson Date of Exam: 12/07/2020 Medical Rec #:  [id number]   Height:       72.0 in Accession #:    [id number]  Weight:       236.0 lb Date of Birth:  05/29/1976   BSA:  2.286 m Patient Age:    44 years    BP:           127/90 mmHg Patient Gender: M           HR:           120 bpm. Exam Location:  Inpatient Procedure: 2D Echo, Color Doppler and Cardiac Doppler Indications:    I26.02 Pulmonary embolus  History:        Patient has no prior history of Echocardiogram examinations.                 Risk Factors:Hypertension, Sleep Apnea and ETOH.  Sonographer:    Irving BurtonEmily Senior RDCS Referring Phys: [id number] ARSHAD N KAKRAKANDY IMPRESSIONS  1. Diffuse hypokinesis worse in septum /apex with abnormal septal motion . Left ventricular ejection fraction, by estimation, is 30 to 35%. The left ventricle has moderately decreased function. The left ventricle has no regional wall motion abnormalities. The left ventricular internal  cavity size was mildly dilated. Left ventricular diastolic parameters are indeterminate.  2. Right ventricular systolic function is mildly reduced. The right ventricular size is mildly enlarged. There is moderately elevated pulmonary artery systolic pressure.  3. Left atrial size was mildly dilated.  4. The mitral valve is normal in structure. Mild mitral valve regurgitation. No evidence of mitral stenosis.  5. The aortic valve is tricuspid. Aortic valve regurgitation is not visualized. No aortic stenosis is present.  6. The inferior vena cava is dilated in size with >50% respiratory variability, suggesting right atrial pressure of 8 mmHg. FINDINGS  Left Ventricle: Diffuse hypokinesis worse in septum /apex with abnormal septal motion. Left ventricular ejection fraction, by estimation, is 30 to 35%. The left ventricle has moderately decreased function. The left ventricle has no regional wall motion abnormalities. The left ventricular internal cavity size was mildly dilated. There is no left ventricular hypertrophy. Left ventricular diastolic parameters are indeterminate. Right Ventricle: The right ventricular size is mildly enlarged. No increase in right ventricular wall thickness. Right ventricular systolic function is mildly reduced. There is moderately elevated pulmonary artery systolic pressure. The tricuspid regurgitant velocity is 2.99 m/s, and with an assumed right atrial pressure of 15 mmHg, the estimated right ventricular systolic pressure is 50.8 mmHg. Left Atrium: Left atrial size was mildly dilated. Right Atrium: Right atrial size was normal in size. Pericardium: There is no evidence of pericardial effusion. Mitral Valve: The mitral valve is normal in structure. There is mild thickening of the mitral valve leaflet(s). There is mild calcification of the mitral valve leaflet(s). Mild mitral valve regurgitation. No evidence of mitral valve stenosis. Tricuspid Valve: The tricuspid valve is normal in  structure. Tricuspid valve regurgitation is mild . No evidence of tricuspid stenosis. Aortic Valve: The aortic valve is tricuspid. Aortic valve regurgitation is not visualized. No aortic stenosis is present. Pulmonic Valve: The pulmonic valve was normal in structure. Pulmonic valve regurgitation is not visualized. No evidence of pulmonic stenosis. Aorta: The aortic root is normal in size and structure. Venous: The inferior vena cava is dilated in size with greater than 50% respiratory variability, suggesting right atrial pressure of 8 mmHg. IAS/Shunts: No atrial level shunt detected by color flow Doppler.  LEFT VENTRICLE PLAX 2D LVIDd:         7.10 cm LVIDs:         5.60 cm LV PW:         0.90 cm LV IVS:        0.70 cm LVOT  diam:     1.80 cm LV SV:         34 LV SV Index:   15 LVOT Area:     2.54 cm  LV Volumes (MOD) LV vol d, MOD A2C: 241.0 ml LV vol d, MOD A4C: 191.0 ml LV vol s, MOD A2C: 196.0 ml LV vol s, MOD A4C: 126.0 ml LV SV MOD A2C:     45.0 ml LV SV MOD A4C:     191.0 ml LV SV MOD BP:      60.5 ml RIGHT VENTRICLE RV S prime:     9.90 cm/s TAPSE (M-mode): 1.2 cm LEFT ATRIUM              Index       RIGHT ATRIUM           Index LA diam:        3.90 cm  1.71 cm/m  RA Area:     24.10 cm LA Vol (A2C):   103.0 ml 45.06 ml/m RA Volume:   79.10 ml  34.60 ml/m LA Vol (A4C):   49.6 ml  21.70 ml/m LA Biplane Vol: 73.5 ml  32.15 ml/m  AORTIC VALVE LVOT Vmax:   102.00 cm/s LVOT Vmean:  76.100 cm/s LVOT VTI:    0.132 m  AORTA Ao Root diam: 3.20 cm Ao Asc diam:  3.40 cm MITRAL VALVE                 TRICUSPID VALVE MV Area (PHT): 4.31 cm      TR Peak grad:   35.8 mmHg MV Decel Time: 176 msec      TR Vmax:        299.00 cm/s MR Peak grad:    80.3 mmHg MR Mean grad:    50.0 mmHg   SHUNTS MR Vmax:         448.00 cm/s Systemic VTI:  0.13 m MR Vmean:        329.0 cm/s  Systemic Diam: 1.80 cm MR PISA:         1.01 cm MR PISA Eff ROA: 9 mm MR PISA Radius:  0.40 cm MV E velocity: 103.00 cm/s Charlton Haws MD  Electronically signed by Charlton Haws MD Signature Date/Time: 12/07/2020/2:43:13 PM    Final    VAS [place] LOWER EXTREMITY VENOUS (DVT)  Result Date: 12/07/2020  Lower Venous DVT Study Indications: Edema, and pulmonary embolism.  Limitations: Significant edema. Comparison Study: No prior study on file Performing Technologist: Sherren Kerns RVS  Examination Guidelines: A complete evaluation includes B-mode imaging, spectral Doppler, color Doppler, and power Doppler as needed of all accessible portions of each vessel. Bilateral testing is considered an integral part of a complete examination. Limited examinations for reoccurring indications may be performed as noted. The reflux portion of the exam is performed with the patient in reverse Trendelenburg.  +---------+---------------+---------+-----------+----------+--------------+ RIGHT    CompressibilityPhasicitySpontaneityPropertiesThrombus Aging +---------+---------------+---------+-----------+----------+--------------+ CFV      Full                                         pulsatile flow +---------+---------------+---------+-----------+----------+--------------+ SFJ      Full                                                        +---------+---------------+---------+-----------+----------+--------------+  FV Prox  Full                                                        +---------+---------------+---------+-----------+----------+--------------+ FV Mid   Full                                                        +---------+---------------+---------+-----------+----------+--------------+ FV DistalFull                                                        +---------+---------------+---------+-----------+----------+--------------+ PFV      Full                                                        +---------+---------------+---------+-----------+----------+--------------+ POP      Full                                          pulsatile flow +---------+---------------+---------+-----------+----------+--------------+ PTV      Full                                                        +---------+---------------+---------+-----------+----------+--------------+ PERO     Full                                                        +---------+---------------+---------+-----------+----------+--------------+   +---------+---------------+---------+-----------+----------+--------------+ LEFT     CompressibilityPhasicitySpontaneityPropertiesThrombus Aging +---------+---------------+---------+-----------+----------+--------------+ CFV      Full                                         pulsatile flow +---------+---------------+---------+-----------+----------+--------------+ SFJ      Full                                                        +---------+---------------+---------+-----------+----------+--------------+ FV Prox  Full                                                        +---------+---------------+---------+-----------+----------+--------------+  FV Mid   Full                                                        +---------+---------------+---------+-----------+----------+--------------+ FV DistalFull                                                        +---------+---------------+---------+-----------+----------+--------------+ PFV      Full                                                        +---------+---------------+---------+-----------+----------+--------------+ POP      Full                                         pulsatile flow +---------+---------------+---------+-----------+----------+--------------+ PTV      Full                                                        +---------+---------------+---------+-----------+----------+--------------+ PERO     Full                                                         +---------+---------------+---------+-----------+----------+--------------+     Summary: RIGHT: - There is no evidence of deep vein thrombosis in the lower extremity.  Pulsatile waveforms, consistent with fluid overload, are noted. Interstitial edema noted throughout  LEFT: - There is no evidence of deep vein thrombosis in the lower extremity.  - Pulsatile waveforms, consistent with fluid overload, are noted. Interstitial edema noted throughout.  *See table(s) above for measurements and observations. Electronically signed by Lemar Livings MD on 12/07/2020 at 11:06:20 PM.    Final     Patient Profile     45 year old male with past medical history of hypertension, sleep apnea, alcoholism admitted with pulmonary embolus for evaluation of acute systolic CHF.  CTA shows right lower lobe pulmonary embolus, right pleural effusion, enlarged pulmonary artery suggestive of pulmonary hypertension, asymmetric gynecomastia and reactive adenopathy. Echocardiogram shows ejection fraction 30 to 35%, mild left ventricular enlargement, mild right ventricular enlargement with mildly reduced RV function, moderate pulmonary hypertension, mild left atrial enlargement, mild mitral regurgitation.  Assessment & Plan    1 acute systolic heart failure-as outlined above echocardiogram shows ejection fraction 30 to 35%.  Decreased LV function is likely secondary to history of alcohol use.  Continue Lasix 40 mg twice daily and add spironolactone 12.5 mg daily.  Add losartan 50 mg daily.  If blood pressure allows will change to [facility].  Add digoxin 0.125 mg daily.  Add carvedilol 3.125 mg twice daily.  Titrate medications as tolerated.  Will need to repeat echocardiogram 3 months after medications fully titrated to see if LV function has improved.  We will arrange cardiac CTA as an outpatient after his pulmonary embolus has been better treated.  2 mitral regurgitation murmur-mitral regurgitation mild on echocardiogram.  3 recent  pulmonary embolus-likely secondary to [place].  Would transition to apixaban.  Needs 6 months of therapy.  4 alcohol abuse-patient states he has been sober for 1 month.  I reiterated need to avoid alcohol altogether.  Cirrhosis noted on liver ultrasound.  5 hypertension-medication adjustments as outlined above for CHF.  6 gynecomastia-Per primary care.  7 hypokalemia-supplement  For questions or updates, please contact [facility] Please consult [email or web address] for contact info under        Signed, Olga MillersBrian Crenshaw, MD  12/08/2020, 8:06 AM

## 2020-12-08 NOTE — Progress Notes (Signed)
MD paged at 1820 to notify of sustained increased pulse in 130s.  Dr. Dayna Barker returned call at 1945 and Onalee Hua, RRT RN in room at this time.  Dr. Dayna Barker states that he will order chest x-ray.  No further orders or actions by RRT RN received at this time.

## 2020-12-08 NOTE — Progress Notes (Signed)
ANTICOAGULATION CONSULT NOTE - Initial Consult  Pharmacy Consult for Lovenox Indication: pulmonary embolus  No Known Allergies  Patient Measurements: Height: 6' (182.9 cm) Weight: 107 kg (236 lb) IBW/kg (Calculated) : 77.6  Vital Signs: Temp: 97.7 F (36.5 C) (01/16 0511) Temp Source: Oral (01/16 0511) BP: 120/99 (01/16 0511) Pulse Rate: 109 (01/16 0511)  Labs: Recent Labs    12/06/20 1600 12/07/20 0040 12/07/20 0406 12/08/20 0207  HGB 15.9  --  13.5  --   HCT 50.4  --  42.3  --   PLT 303  --  237  --   LABPROT 15.7*  --   --   --   INR 1.3*  --   --   --   CREATININE 1.17  --   --  1.05  TROPONINIHS  --  27* 38*  --     Estimated Creatinine Clearance: 111.2 mL/min (by C-G formula based on SCr of 1.05 mg/dL).   Medical History: Past Medical History:  Diagnosis Date  . Alcoholic fatty liver   . Alcoholism (HCC)    sober since 10/2020  . Anxiety   . HTN (hypertension)   . OSA (obstructive sleep apnea)    on CPAP    Medications:  No current facility-administered medications on file prior to encounter.   Current Outpatient Medications on File Prior to Encounter  Medication Sig Dispense Refill  . APPLE CIDER VINEGAR PO Take 1 Dose by mouth daily. Apple Cider Vinegar gummies    . atenolol (TENORMIN) 25 MG tablet Take 25 mg by mouth daily.    Marland Kitchen PARoxetine (PAXIL) 20 MG tablet Take 20 mg by mouth daily.       Assessment: 45 year old male with no prior medical history presented to [facility] ED 1/14 with SOB and possible PE. Pharmacy consulted to start treatment lovenox.  Renal function stable. No bleeding reported per RN.  Goal of Therapy:  Full anticoagulation with Lovenox Monitor platelets by anticoagulation protocol: Yes   Plan:  - Continue lovenox 110 SQ every 12 hours - CBC every 72 hours - Monitor for s/sx's of bleeding - Follow-up long-term PE treatment plans     Cyniah Gossard L. Arlester Marker, PharmD, MBA [facility] PGY2 Pharmacy Resident Weekends 7:00 am - 3:00 pm,  please call [phone] 12/08/20      9:50 AM  Please check AMION for all [facility] Pharmacy phone numbers After 10:00 PM, call the Main Pharmacy [phone]

## 2020-12-09 ENCOUNTER — Encounter (HOSPITAL_COMMUNITY): Payer: Self-pay | Admitting: Internal Medicine

## 2020-12-09 DIAGNOSIS — I2699 Other pulmonary embolism without acute cor pulmonale: Secondary | ICD-10-CM | POA: Diagnosis not present

## 2020-12-09 DIAGNOSIS — I2694 Multiple subsegmental pulmonary emboli without acute cor pulmonale: Secondary | ICD-10-CM | POA: Diagnosis not present

## 2020-12-09 DIAGNOSIS — I420 Dilated cardiomyopathy: Secondary | ICD-10-CM | POA: Diagnosis not present

## 2020-12-09 DIAGNOSIS — R000 Tachycardia, unspecified: Secondary | ICD-10-CM

## 2020-12-09 LAB — LIPID PANEL
Cholesterol: 159 mg/dL (ref 0–200)
HDL: 26 mg/dL — ABNORMAL LOW (ref >40)
LDL Cholesterol: 111 mg/dL — ABNORMAL HIGH (ref 0–99)
Total CHOL/HDL Ratio: 6.1 RATIO
Triglycerides: 109 mg/dL (ref <150)
VLDL: 22 mg/dL (ref 0–40)

## 2020-12-09 LAB — BASIC METABOLIC PANEL
Anion gap: 13 (ref 5–15)
BUN: 10 mg/dL (ref 6–20)
CO2: 34 mmol/L — ABNORMAL HIGH (ref 22–32)
Calcium: 8.8 mg/dL — ABNORMAL LOW (ref 8.9–10.3)
Chloride: 94 mmol/L — ABNORMAL LOW (ref 98–111)
Creatinine, Ser: 1.07 mg/dL (ref 0.61–1.24)
GFR, Estimated: >60 mL/min (ref >60)
Glucose, Bld: 142 mg/dL — ABNORMAL HIGH (ref 70–99)
Potassium: 3.2 mmol/L — ABNORMAL LOW (ref 3.5–5.1)
Sodium: 141 mmol/L (ref 135–145)

## 2020-12-09 MED ORDER — APIXABAN 5 MG PO TABS: 5.0000 mg | ORAL_TABLET | Freq: Two times a day (BID) | ORAL | Status: DC

## 2020-12-09 MED ORDER — APIXABAN (ELIQUIS) EDUCATION KIT FOR DVT/PE PATIENTS
PACK | Freq: Once | Status: AC
  Filled 2020-12-09: qty 1

## 2020-12-09 MED ORDER — POTASSIUM CHLORIDE CRYS ER 20 MEQ PO TBCR
40.0000 meq | EXTENDED_RELEASE_TABLET | Freq: Two times a day (BID) | ORAL | Status: DC
  Administered 2020-12-09 – 2020-12-10 (×4): 40 meq via ORAL
  Filled 2020-12-09 (×4): qty 2

## 2020-12-09 MED ORDER — MAGNESIUM OXIDE 400 (241.3 MG) MG PO TABS
400.0000 mg | ORAL_TABLET | Freq: Two times a day (BID) | ORAL | Status: DC
  Administered 2020-12-09 – 2020-12-11 (×5): 400 mg via ORAL
  Filled 2020-12-09 (×5): qty 1

## 2020-12-09 MED ORDER — APIXABAN 5 MG PO TABS
10.0000 mg | ORAL_TABLET | Freq: Two times a day (BID) | ORAL | Status: DC
  Administered 2020-12-09 – 2020-12-11 (×4): 10 mg via ORAL
  Filled 2020-12-09 (×4): qty 2

## 2020-12-09 MED ORDER — CARVEDILOL 6.25 MG PO TABS
6.2500 mg | ORAL_TABLET | Freq: Two times a day (BID) | ORAL | Status: DC
  Administered 2020-12-09 – 2020-12-10 (×2): 6.25 mg via ORAL
  Filled 2020-12-09 (×2): qty 1

## 2020-12-09 NOTE — Plan of Care (Signed)

## 2020-12-09 NOTE — Discharge Instructions (Signed)
Information on my medicine - ELIQUIS (apixaban)  Why was Eliquis prescribed for you? Eliquis was prescribed to treat blood clots that may have been found in the veins of your legs (deep vein thrombosis) or in your lungs (pulmonary embolism) and to reduce the risk of them occurring again.  What do You need to know about Eliquis ? The starting dose is 10 mg (two 5 mg tablets) taken TWICE daily for the FIRST SEVEN (7) DAYS, then on 1/24  the dose is reduced to ONE 5 mg tablet taken TWICE daily.  Eliquis may be taken with or without food.   Try to take the dose about the same time in the morning and in the evening. If you have difficulty swallowing the tablet whole please discuss with your pharmacist how to take the medication safely.  Take Eliquis exactly as prescribed and DO NOT stop taking Eliquis without talking to the doctor who prescribed the medication.  Stopping may increase your risk of developing a new blood clot.  Refill your prescription before you run out.  After discharge, you should have regular check-up appointments with your healthcare provider that is prescribing your Eliquis.    What do you do if you miss a dose? If a dose of ELIQUIS is not taken at the scheduled time, take it as soon as possible on the same day and twice-daily administration should be resumed. The dose should not be doubled to make up for a missed dose.  Important Safety Information A possible side effect of Eliquis is bleeding. You should call your healthcare provider right away if you experience any of the following: ? Bleeding from an injury or your nose that does not stop. ? Unusual colored urine (red or dark brown) or unusual colored stools (red or black). ? Unusual bruising for unknown reasons. ? A serious fall or if you hit your head (even if there is no bleeding).  Some medicines may interact with Eliquis and might increase your risk of bleeding or clotting while on Eliquis. To help avoid  this, consult your healthcare provider or pharmacist prior to using any new prescription or non-prescription medications, including herbals, vitamins, non-steroidal anti-inflammatory drugs (NSAIDs) and supplements.  This website has more information on Eliquis (apixaban): http://www.eliquis.com/eliquis/home

## 2020-12-09 NOTE — Progress Notes (Signed)
ANTICOAGULATION CONSULT NOTE   Pharmacy Consult for Lovenox >>apixaban Indication: pulmonary embolus  No Known Allergies  Patient Measurements: Height: 6' (182.9 cm) Weight: 96.9 kg (213 lb 10 oz) IBW/kg (Calculated) : 77.6  Vital Signs: Temp: 98 F (36.7 C) (01/17 0829) Temp Source: Oral (01/17 0829) BP: 114/95 (01/17 0829) Pulse Rate: 113 (01/17 0829)  Labs: Recent Labs    12/06/20 1600 12/07/20 0040 12/07/20 0406 12/08/20 0207 12/09/20 0141  HGB 15.9  --  13.5  --   --   HCT 50.4  --  42.3  --   --   PLT 303  --  237  --   --   LABPROT 15.7*  --   --   --   --   INR 1.3*  --   --   --   --   CREATININE 1.17  --   --  1.05 1.07  TROPONINIHS  --  27* 38*  --   --     Estimated Creatinine Clearance: 104.1 mL/min (by C-G formula based on SCr of 1.07 mg/dL).   Medical History: Past Medical History:  Diagnosis Date  . Alcoholic fatty liver   . Alcoholism (HCC)    sober since 10/2020  . Anxiety   . HTN (hypertension)   . OSA (obstructive sleep apnea)    on CPAP    Medications:  No current facility-administered medications on file prior to encounter.   Current Outpatient Medications on File Prior to Encounter  Medication Sig Dispense Refill  . APPLE CIDER VINEGAR PO Take 1 Dose by mouth daily. Apple Cider Vinegar gummies    . atenolol (TENORMIN) 25 MG tablet Take 25 mg by mouth daily.    Marland Kitchen PARoxetine (PAXIL) 20 MG tablet Take 20 mg by mouth daily.       Assessment: 45 year old male with no prior medical history presented to [facility] ED 1/14 with SOB and now confirmed PE. Pharmacy consulted to transition patient from enoxaparin to apixaban.   Renal function stable. No bleeding reported per RN.  Goal of Therapy:  Full anticoagulation with Lovenox Monitor platelets by anticoagulation protocol: Yes   Plan:  Lovenox given this morning - start apixaban 10mg  bid x 7 days then 5mg  bid CBC as indicated Consult CM for copay information  PharmD.,  BCPS Clinical Pharmacist 12/09/2020 1:36 PM

## 2020-12-09 NOTE — Progress Notes (Addendum)
Progress Note  Patient Name: Gregory Stevenson Date of Encounter: 12/09/2020  [facility] Cardiologist: Olga MillersBrian Crenshaw, MD   Subjective   Breathing stable. No chest pain.   Inpatient Medications    Scheduled Meds: . apixaban   Does not apply Once  . apixaban  10 mg Oral BID   Followed by  . [START ON 12/16/2020] apixaban  5 mg Oral BID  . carvedilol  3.125 mg Oral BID WC  . digoxin  0.125 mg Oral Daily  . furosemide  40 mg Intravenous BID  . influenza vac split quadrivalent PF  0.5 mL Intramuscular Tomorrow-1000  . losartan  50 mg Oral Daily  . magnesium oxide  400 mg Oral BID  . PARoxetine  20 mg Oral Daily  . potassium chloride  40 mEq Oral BID  . spironolactone  12.5 mg Oral Daily   Continuous Infusions:  PRN Meds: acetaminophen **OR** acetaminophen   Vital Signs    Vitals:   12/08/20 2343 12/09/20 0500 12/09/20 0634 12/09/20 0829  BP: 112/87  (!) 117/94 (!) 114/95  Pulse: (!) 119  (!) 108 (!) 113  Resp:   15 17  Temp: 99.2 F (37.3 C)  97.8 F (36.6 C) 98 F (36.7 C)  TempSrc: Oral  Oral Oral  SpO2: 98%  98% 98%  Weight:  96.9 kg    Height:        Intake/Output Summary (Last 24 hours) at 12/09/2020 1110 Last data filed at 12/09/2020 0900 Gross per 24 hour  Intake 240 ml  Output 3900 ml  Net -3660 ml   Last 3 Weights 12/09/2020 12/06/2020  Weight (lbs) 213 lb 10 oz 236 lb  Weight (kg) 96.9 kg 107.049 kg      Telemetry    Sinus tachycardia 110-120s - Personally Reviewed  ECG    N/A  Physical Exam   GEN: No acute distress.   Neck: No JVD Cardiac: regular tachycardic, no murmurs, rubs, or gallops.  Respiratory: Clear to auscultation bilaterally. GI: Soft, nontender, non-distended  MS: No edema; No deformity. Neuro:  Nonfocal  Psych: Normal affect   Labs    High Sensitivity Troponin:   Recent Labs  Lab 12/07/20 0040 12/07/20 0406  TROPONINIHS 27* 38*      Chemistry Recent Labs  Lab 12/06/20 1600 12/08/20 0207 12/09/20 0141  NA  139 139 141  K 3.5 3.2* 3.2*  CL 101 99 94*  CO2 23 23 34*  GLUCOSE 162* 117* 142*  BUN 14 14 10   CREATININE 1.17 1.05 1.07  CALCIUM 9.1 8.4* 8.8*  PROT 6.9  --   --   ALBUMIN 3.8  --   --   AST 27  --   --   ALT 21  --   --   ALKPHOS 90  --   --   BILITOT 1.8*  --   --   GFRNONAA >60 >60 >60  ANIONGAP 15 17* 13     Hematology Recent Labs  Lab 12/06/20 1600 12/07/20 0406  WBC 9.3 5.7  RBC 5.20 4.43  HGB 15.9 13.5  HCT 50.4 42.3  MCV 96.9 95.5  MCH 30.6 30.5  MCHC 31.5 31.9  RDW 12.7 12.7  PLT 303 237    BNP Recent Labs  Lab 12/06/20 1601  BNP 1,516.1*     DDimer No results for input(s): DDIMER in the last 168 hours.   Radiology    DG Chest 1 View  Result Date: 12/08/2020 CLINICAL DATA:  Shortness  of breath with leg swelling and abdominal distension. EXAM: CHEST  1 VIEW COMPARISON:  December 06, 2020 FINDINGS: Very mild atelectasis is noted within the bilateral lung bases. Small bilateral pleural effusions are seen. These are very mildly increased in size when compared to the prior study. No pneumothorax is identified. The cardiac silhouette is markedly enlarged and unchanged in size. The visualized skeletal structures are unremarkable. IMPRESSION: Cardiomegaly with small bilateral pleural effusions and mild bibasilar atelectasis. Electronically Signed   By: Aram Candela M.D.   On: 12/08/2020 20:10   ECHOCARDIOGRAM COMPLETE  Result Date: 12/07/2020    ECHOCARDIOGRAM REPORT   Patient Name:   Gregory Stevenson Date of Exam: 12/07/2020 Medical Rec #:  [id number]   Height:       72.0 in Accession #:    [id number]  Weight:       236.0 lb Date of Birth:  Jun 20, 1976   BSA:          2.286 m Patient Age:    45 years    BP:           127/90 mmHg Patient Gender: M           HR:           120 bpm. Exam Location:  Inpatient Procedure: 2D Echo, Color Doppler and Cardiac Doppler Indications:    I26.02 Pulmonary embolus  History:        Patient has no prior history of Echocardiogram  examinations.                 Risk Factors:Hypertension, Sleep Apnea and ETOH.  Sonographer:    Irving Burton Senior RDCS Referring Phys: [id number] ARSHAD N KAKRAKANDY IMPRESSIONS  1. Diffuse hypokinesis worse in septum /apex with abnormal septal motion . Left ventricular ejection fraction, by estimation, is 30 to 35%. The left ventricle has moderately decreased function. The left ventricle has no regional wall motion abnormalities. The left ventricular internal cavity size was mildly dilated. Left ventricular diastolic parameters are indeterminate.  2. Right ventricular systolic function is mildly reduced. The right ventricular size is mildly enlarged. There is moderately elevated pulmonary artery systolic pressure.  3. Left atrial size was mildly dilated.  4. The mitral valve is normal in structure. Mild mitral valve regurgitation. No evidence of mitral stenosis.  5. The aortic valve is tricuspid. Aortic valve regurgitation is not visualized. No aortic stenosis is present.  6. The inferior vena cava is dilated in size with >50% respiratory variability, suggesting right atrial pressure of 8 mmHg. FINDINGS  Left Ventricle: Diffuse hypokinesis worse in septum /apex with abnormal septal motion. Left ventricular ejection fraction, by estimation, is 30 to 35%. The left ventricle has moderately decreased function. The left ventricle has no regional wall motion abnormalities. The left ventricular internal cavity size was mildly dilated. There is no left ventricular hypertrophy. Left ventricular diastolic parameters are indeterminate. Right Ventricle: The right ventricular size is mildly enlarged. No increase in right ventricular wall thickness. Right ventricular systolic function is mildly reduced. There is moderately elevated pulmonary artery systolic pressure. The tricuspid regurgitant velocity is 2.99 m/s, and with an assumed right atrial pressure of 15 mmHg, the estimated right ventricular systolic pressure is 50.8 mmHg. Left  Atrium: Left atrial size was mildly dilated. Right Atrium: Right atrial size was normal in size. Pericardium: There is no evidence of pericardial effusion. Mitral Valve: The mitral valve is normal in structure. There is mild thickening of the mitral valve leaflet(s). There is mild  calcification of the mitral valve leaflet(s). Mild mitral valve regurgitation. No evidence of mitral valve stenosis. Tricuspid Valve: The tricuspid valve is normal in structure. Tricuspid valve regurgitation is mild . No evidence of tricuspid stenosis. Aortic Valve: The aortic valve is tricuspid. Aortic valve regurgitation is not visualized. No aortic stenosis is present. Pulmonic Valve: The pulmonic valve was normal in structure. Pulmonic valve regurgitation is not visualized. No evidence of pulmonic stenosis. Aorta: The aortic root is normal in size and structure. Venous: The inferior vena cava is dilated in size with greater than 50% respiratory variability, suggesting right atrial pressure of 8 mmHg. IAS/Shunts: No atrial level shunt detected by color flow Doppler.  LEFT VENTRICLE PLAX 2D LVIDd:         7.10 cm LVIDs:         5.60 cm LV PW:         0.90 cm LV IVS:        0.70 cm LVOT diam:     1.80 cm LV SV:         34 LV SV Index:   15 LVOT Area:     2.54 cm  LV Volumes (MOD) LV vol d, MOD A2C: 241.0 ml LV vol d, MOD A4C: 191.0 ml LV vol s, MOD A2C: 196.0 ml LV vol s, MOD A4C: 126.0 ml LV SV MOD A2C:     45.0 ml LV SV MOD A4C:     191.0 ml LV SV MOD BP:      60.5 ml RIGHT VENTRICLE RV S prime:     9.90 cm/s TAPSE (M-mode): 1.2 cm LEFT ATRIUM              Index       RIGHT ATRIUM           Index LA diam:        3.90 cm  1.71 cm/m  RA Area:     24.10 cm LA Vol (A2C):   103.0 ml 45.06 ml/m RA Volume:   79.10 ml  34.60 ml/m LA Vol (A4C):   49.6 ml  21.70 ml/m LA Biplane Vol: 73.5 ml  32.15 ml/m  AORTIC VALVE LVOT Vmax:   102.00 cm/s LVOT Vmean:  76.100 cm/s LVOT VTI:    0.132 m  AORTA Ao Root diam: 3.20 cm Ao Asc diam:  3.40 cm  MITRAL VALVE                 TRICUSPID VALVE MV Area (PHT): 4.31 cm      TR Peak grad:   35.8 mmHg MV Decel Time: 176 msec      TR Vmax:        299.00 cm/s MR Peak grad:    80.3 mmHg MR Mean grad:    50.0 mmHg   SHUNTS MR Vmax:         448.00 cm/s Systemic VTI:  0.13 m MR Vmean:        329.0 cm/s  Systemic Diam: 1.80 cm MR PISA:         1.01 cm MR PISA Eff ROA: 9 mm MR PISA Radius:  0.40 cm MV E velocity: 103.00 cm/s Charlton Haws MD Electronically signed by Charlton Haws MD Signature Date/Time: 12/07/2020/2:43:13 PM    Final    VAS [place] LOWER EXTREMITY VENOUS (DVT)  Result Date: 12/07/2020  Lower Venous DVT Study Indications: Edema, and pulmonary embolism.  Limitations: Significant edema. Comparison Study: No prior study on file Performing Technologist: Sherren Kerns  RVS  Examination Guidelines: A complete evaluation includes B-mode imaging, spectral Doppler, color Doppler, and power Doppler as needed of all accessible portions of each vessel. Bilateral testing is considered an integral part of a complete examination. Limited examinations for reoccurring indications may be performed as noted. The reflux portion of the exam is performed with the patient in reverse Trendelenburg.  +---------+---------------+---------+-----------+----------+--------------+ RIGHT    CompressibilityPhasicitySpontaneityPropertiesThrombus Aging +---------+---------------+---------+-----------+----------+--------------+ CFV      Full                                         pulsatile flow +---------+---------------+---------+-----------+----------+--------------+ SFJ      Full                                                        +---------+---------------+---------+-----------+----------+--------------+ FV Prox  Full                                                        +---------+---------------+---------+-----------+----------+--------------+ FV Mid   Full                                                         +---------+---------------+---------+-----------+----------+--------------+ FV DistalFull                                                        +---------+---------------+---------+-----------+----------+--------------+ PFV      Full                                                        +---------+---------------+---------+-----------+----------+--------------+ POP      Full                                         pulsatile flow +---------+---------------+---------+-----------+----------+--------------+ PTV      Full                                                        +---------+---------------+---------+-----------+----------+--------------+ PERO     Full                                                        +---------+---------------+---------+-----------+----------+--------------+   +---------+---------------+---------+-----------+----------+--------------+  LEFT     CompressibilityPhasicitySpontaneityPropertiesThrombus Aging +---------+---------------+---------+-----------+----------+--------------+ CFV      Full                                         pulsatile flow +---------+---------------+---------+-----------+----------+--------------+ SFJ      Full                                                        +---------+---------------+---------+-----------+----------+--------------+ FV Prox  Full                                                        +---------+---------------+---------+-----------+----------+--------------+ FV Mid   Full                                                        +---------+---------------+---------+-----------+----------+--------------+ FV DistalFull                                                        +---------+---------------+---------+-----------+----------+--------------+ PFV      Full                                                         +---------+---------------+---------+-----------+----------+--------------+ POP      Full                                         pulsatile flow +---------+---------------+---------+-----------+----------+--------------+ PTV      Full                                                        +---------+---------------+---------+-----------+----------+--------------+ PERO     Full                                                        +---------+---------------+---------+-----------+----------+--------------+     Summary: RIGHT: - There is no evidence of deep vein thrombosis in the lower extremity.  Pulsatile waveforms, consistent with fluid overload, are noted. Interstitial edema noted throughout  LEFT: - There is no evidence of deep vein thrombosis in the lower extremity.  - Pulsatile waveforms, consistent with fluid overload, are noted. Interstitial edema noted throughout.  *  See table(s) above for measurements and observations. Electronically signed by Lemar Livings MD on 12/07/2020 at 11:06:20 PM.    Final     Cardiac Studies   Echo 12/07/2020 1. Diffuse hypokinesis worse in septum /apex with abnormal septal motion  . Left ventricular ejection fraction, by estimation, is 30 to 35%. The  left ventricle has moderately decreased function. The left ventricle has  no regional wall motion  abnormalities. The left ventricular internal cavity size was mildly  dilated. Left ventricular diastolic parameters are indeterminate.  2. Right ventricular systolic function is mildly reduced. The right  ventricular size is mildly enlarged. There is moderately elevated  pulmonary artery systolic pressure.  3. Left atrial size was mildly dilated.  4. The mitral valve is normal in structure. Mild mitral valve  regurgitation. No evidence of mitral stenosis.  5. The aortic valve is tricuspid. Aortic valve regurgitation is not  visualized. No aortic stenosis is present.  6. The inferior vena cava  is dilated in size with >50% respiratory  variability, suggesting right atrial pressure of 8 mmHg.   Patient Profile     45 year old male with past medical history of hypertension, sleep apnea, alcoholism admitted with pulmonary embolus for evaluation of acute systolic CHF.   Assessment & Plan    1. Acute systolic CHF - Echo shows ejection fraction 30 to 35%, mild left ventricular enlargement, mild right ventricular enlargement with mildly reduced RV function, moderate pulmonary hypertension, mild left atrial enlargement, mild mitral regurgitation. - Not volume overloaded on exam  - Continue Coreg, Losartan, Digoxin and spironolactone>> will review meds titration with MD. BP stable.  - Plan for coronary CTA as outpatient one better HR and better treated PE   2. HTN - BP stable on current meds  3. PE - On Eliquis for anticoagulation   For questions or updates, please contact [facility] Please consult [email or web address] for contact info under        SignedManson Passey, PA  12/09/2020, 11:10 AM     Patient seen and examined. Agree with assessment and plan.  No chest pain.  Shortness of breath has improved.  He continues to have tachycardia heart rate in the low 100s.  Patient states he had been on atenolol 25 mg daily and his last dose was on Friday.  He was started on carvedilol at low-dose 3.125 mg twice a day.  We will titrate carvedilol to 6.25 mg twice a day today.  We will recheck ECG today.  Patient admits to significant alcohol use but fortunately quit 1 month ago.  Prior to cessation, he had been drinking approximately 12 beers per day in addition to other drinks.  Certainly possible his reduced LV function may be contributed by his prior longstanding EtOH use.  LV is mildly dilated and there is mild MR. EF on echo was 30 to 35%.  BNP 1561 consistent with acute CHF.  Mild cardiomegaly with small pleural effusions and interstitial changes.  He is now on Eliquis 10 mg bid  for acute PE dosing.   Lennette Bihari, MD, [facility] 12/09/2020 11:45 AM

## 2020-12-09 NOTE — Plan of Care (Signed)

## 2020-12-09 NOTE — Progress Notes (Signed)
PROGRESS NOTE    Gregory Stevenson  WUJ:[id number] DOB: [birth year] DOA: 12/06/2020 PCP: Pcp, No   Chief Complaint  Patient presents with  . Shortness of Breath  . Leg Swelling  . Abdominal Distention     Brief Narrative: 45 year old male with anxiety/depression presented to the ED with shortness of breath over 3 weeks, seen in the ED COVID-19 negative work-up reviewed right lower lobe segmental and subsegmental pulmonary embolism with no heart strain also with right-sided pleural effusion with associated atelectasis, likely reactive hilar and mediastinal lymph nodes, prominent left ventricle, trace pericardial effusion, asymmetric gynecomastia, small volume simple free fluid ascites. Labs with no leukocytosis but significantly elevated proBNP 1516, troponin> 27>38. Placed on Lovenox and admitted Cardiology was consulted Patient endorsed last wt 240s- normally in 220 and noticed more abdomen distension and wt going down now and "belly going down"  Subjective: Reports he is feeling better today.  No new complaints.  Pulm edema has improved. Tachy but doing well. No shortness of breaht needing [place] at 1.5l  Assessment & Plan:  Acute systolic congestive heart failure with pleural effusion: New onset with EF 30 to 35% on echocardiogram, likely from alcohol abuse per cardiology continue with IV Lasix, Aldactone, losartan, digoxin and carvedilol.  Entresto if BP allows.  Continues to have tachycardia.  Overall leg swelling has improved.Cardiologist planning for CT as an outpatient after PE has been treated Net IO Since Admission: -8,000 mL [12/09/20 0940]   Intake/Output Summary (Last 24 hours) at 12/09/2020 0940 Last data filed at 12/09/2020 0900 Gross per 24 hour  Intake 240 ml  Output 4200 ml  Net -3960 ml   Acute hypoxic respiratory failure: Due to CHF/PE, pleural effusion, needing 2 L oxygen, continue to wean oxygen.  Increase activity.    New onset diabetes hemoglobin A1c 7.3: Discussed  with the patient about diagnosis, can start metformin on d.c vs JARDIANCE in the setting of systolic CHF, messaged cardiology.   Hypokalemia being repleted Hypomagnesemia added magnesium oxide  Recent pulmonary embolism: Likely from recent [place], will switch to Eliquis.   Hypertension bp stable started on multiple cardiac regimen see #1.    Pulmonary hypertension: Noted in the CT scan, echo shows moderate pulmonary hypertension.  Bilateral gynecomastia seen in the CTA, has severe alcohol abuse likely the etiology, it is bilateral, discussed that he needs to get a mammogram as outpatient from his PCP he understands.  History of alcoholism, patient has been sober for 1 month as per him.  He has been counseled to avoid alcohol  Anxiety disorder continue home Paxil.  Atenolol discontinued.  Morbid obesity BMI 32, outpatient follow-up for weight loss and also sleep apnea evaluation.  We will check a lipid panel fasting and also hemoglobin A1c  Nutrition: Diet Order            Diet Heart Room service appropriate? Yes; Fluid consistency: Thin  Diet effective now                Body mass index is 28.97 kg/m.  DVT prophylaxis: lovenox Code Status:   Code Status: Full Code  Family Communication: plan of care discussed with patient at bedside.  Status is: Inpatient Remains inpatient appropriate because:IV treatments appropriate due to intensity of illness or inability to take PO and Inpatient level of care appropriate due to severity of illness  Dispo: The patient is from: Home  Anticipated d/c is to: Home              Anticipated d/c date is:1- 2 days once cleared by cardiology              Patient currently is not medically stable to d/c.  Consultants:see note  Procedures:see note  Culture/Microbiology No results found for: SDES, SPECREQUEST, CULT, REPTSTATUS  Other culture-see note  Medications: Scheduled Meds: . carvedilol  3.125 mg Oral  BID WC  . digoxin  0.125 mg Oral Daily  . enoxaparin (LOVENOX) injection  110 mg Subcutaneous BID  . furosemide  40 mg Intravenous BID  . influenza vac split quadrivalent PF  0.5 mL Intramuscular Tomorrow-1000  . losartan  50 mg Oral Daily  . magnesium oxide  400 mg Oral BID  . PARoxetine  20 mg Oral Daily  . potassium chloride  40 mEq Oral BID  . spironolactone  12.5 mg Oral Daily   Continuous Infusions:  Antimicrobials: Anti-infectives (From admission, onward)   None     Objective: Vitals: Today's Vitals   12/09/20 0500 12/09/20 0634 12/09/20 0829 12/09/20 0903  BP:  (!) 117/94 (!) 114/95   Pulse:  (!) 108 (!) 113   Resp:  15 17   Temp:  97.8 F (36.6 C) 98 F (36.7 C)   TempSrc:  Oral Oral   SpO2:  98% 98%   Weight: 96.9 kg     Height:      PainSc:    0-No pain    Intake/Output Summary (Last 24 hours) at 12/09/2020 0940 Last data filed at 12/09/2020 0900 Gross per 24 hour  Intake 240 ml  Output 4200 ml  Net -3960 ml   Filed Weights   12/06/20 1556 12/09/20 0500  Weight: 107 kg 96.9 kg   Weight change:   Intake/Output from previous day: 01/16 0701 - 01/17 0700 In: 480 [P.O.:480] Out: 3900 [Urine:3900] Intake/Output this shift: Total I/O In: -  Out: 300 [Urine:300]  Examination: General exam: AAOx3, obese , NAD, weak appearing. HEENT:Oral mucosa moist, Ear/Nose WNL grossly, dentition normal. Respiratory system: bilaterally mild basal crackles,no wheezing or crackles,no use of accessory muscle Cardiovascular system: S1 & S2 +, No JVD,. Gastrointestinal system: Abdomen soft, NT,ND, BS+ Nervous System:Alert, awake, moving extremities and grossly nonfocal Extremities: Improved leg edema, distal peripheral pulses palpable.  Skin: No rashes,no icterus. MSK: Normal muscle bulk,tone, power  Data Reviewed: I have personally reviewed following labs and imaging studies CBC: Recent Labs  Lab 12/06/20 1600 12/07/20 0406  WBC 9.3 5.7  HGB 15.9 13.5  HCT  50.4 42.3  MCV 96.9 95.5  PLT 303 237   Basic Metabolic Panel: Recent Labs  Lab 12/06/20 1600 12/08/20 0207 12/08/20 1143 12/09/20 0141  NA 139 139  --  141  K 3.5 3.2*  --  3.2*  CL 101 99  --  94*  CO2 23 23  --  34*  GLUCOSE 162* 117*  --  142*  BUN 14 14  --  10  CREATININE 1.17 1.05  --  1.07  CALCIUM 9.1 8.4*  --  8.8*  MG  --  1.4* 1.5*  --    GFR: Estimated Creatinine Clearance: 104.1 mL/min (by C-G formula based on SCr of 1.07 mg/dL). Liver Function Tests: Recent Labs  Lab 12/06/20 1600  AST 27  ALT 21  ALKPHOS 90  BILITOT 1.8*  PROT 6.9  ALBUMIN 3.8   Recent Labs  Lab 12/06/20 1600  LIPASE 44  No results for input(s): AMMONIA in the last 168 hours. Coagulation Profile: Recent Labs  Lab 12/06/20 1600  INR 1.3*   Cardiac Enzymes: No results for input(s): CKTOTAL, CKMB, CKMBINDEX, TROPONINI in the last 168 hours. BNP (last 3 results) No results for input(s): PROBNP in the last 8760 hours. HbA1C: Recent Labs    12/07/20 0406  HGBA1C 7.3*   CBG: No results for input(s): GLUCAP in the last 168 hours. Lipid Profile: Recent Labs    12/09/20 0141  CHOL 159  HDL 26*  LDLCALC 111*  TRIG 109  CHOLHDL 6.1   Thyroid Function Tests: Recent Labs    12/07/20 0406  TSH 1.733   Anemia Panel: No results for input(s): VITAMINB12, FOLATE, FERRITIN, TIBC, IRON, RETICCTPCT in the last 72 hours. Sepsis Labs: Recent Labs  Lab 12/07/20 0406  PROCALCITON 0.15    Recent Results (from the past 240 hour(s))  Resp Panel by RT-PCR (Flu A&B, Covid) Nasopharyngeal Swab     Status: None   Collection Time: 12/07/20 12:40 AM   Specimen: Nasopharyngeal Swab; Nasopharyngeal(NP) swabs in vial transport medium  Result Value Ref Range Status   SARS Coronavirus 2 by RT PCR NEGATIVE NEGATIVE Final    Comment: (NOTE) SARS-CoV-2 target nucleic acids are NOT DETECTED.  The SARS-CoV-2 RNA is generally detectable in upper respiratory specimens during the acute  phase of infection. The lowest concentration of SARS-CoV-2 viral copies this assay can detect is 138 copies/mL. A negative result does not preclude SARS-Cov-2 infection and should not be used as the sole basis for treatment or other patient management decisions. A negative result may occur with  improper specimen collection/handling, submission of specimen other than nasopharyngeal swab, presence of viral mutation(s) within the areas targeted by this assay, and inadequate number of viral copies(<138 copies/mL). A negative result must be combined with clinical observations, patient history, and epidemiological information. The expected result is Negative.  Fact Sheet for Patients:  BloggerCourse.com  Fact Sheet for Healthcare Providers:  SeriousBroker.it  This test is no t yet approved or cleared by the [place] FDA and  has been authorized for detection and/or diagnosis of SARS-CoV-2 by FDA under an Emergency Use Authorization (EUA). This EUA will remain  in effect (meaning this test can be used) for the duration of the COVID-19 declaration under Section 564(b)(1) of the Act, 21 U.S.C.section 360bbb-3(b)(1), unless the authorization is terminated  or revoked sooner.       Influenza A by PCR NEGATIVE NEGATIVE Final   Influenza B by PCR NEGATIVE NEGATIVE Final    Comment: (NOTE) The Xpert Xpress SARS-CoV-2/FLU/RSV plus assay is intended as an aid in the diagnosis of influenza from Nasopharyngeal swab specimens and should not be used as a sole basis for treatment. Nasal washings and aspirates are unacceptable for Xpert Xpress SARS-CoV-2/FLU/RSV testing.  Fact Sheet for Patients: BloggerCourse.com  Fact Sheet for Healthcare Providers: SeriousBroker.it  This test is not yet approved or cleared by the [place] FDA and has been authorized for detection and/or diagnosis of  SARS-CoV-2 by FDA under an Emergency Use Authorization (EUA). This EUA will remain in effect (meaning this test can be used) for the duration of the COVID-19 declaration under Section 564(b)(1) of the Act, 21 U.S.C. section 360bbb-3(b)(1), unless the authorization is terminated or revoked.  Performed at [facility], [address], [place], [place] [address]      Radiology Studies: DG Chest 1 View  Result Date: 12/08/2020 CLINICAL DATA:  Shortness of  breath with leg swelling and abdominal distension. EXAM: CHEST  1 VIEW COMPARISON:  December 06, 2020 FINDINGS: Very mild atelectasis is noted within the bilateral lung bases. Small bilateral pleural effusions are seen. These are very mildly increased in size when compared to the prior study. No pneumothorax is identified. The cardiac silhouette is markedly enlarged and unchanged in size. The visualized skeletal structures are unremarkable. IMPRESSION: Cardiomegaly with small bilateral pleural effusions and mild bibasilar atelectasis. Electronically Signed   By: Aram Candelahaddeus  Houston M.D.   On: 12/08/2020 20:10   US Abdomen Complete  Result Date: 12/07/2020 CLINICAL DATA:  Abdominal pain and difficulty breathing. EXAM: ABDOMEN ULTRASOUND COMPLETE COMPARISON:  None. FINDINGS: Gallbladder: No gallstones or adjacent free fluid. Gallbladder wall measures 4 mm which is nonspecific in the setting of ascites. No sonographic Murphy sign noted by sonographer. Common bile duct: Diameter: 3 mm. Liver: Mild nodular contour to liver suggesting a degree of cirrhosis without focal mass. Within normal limits in parenchymal echogenicity. Portal vein is patent on color Doppler imaging with normal direction of blood flow towards the liver. IVC: No abnormality visualized. Pancreas: Visualized portion unremarkable. Spleen: Size and appearance within normal limits. Right Kidney: Length: 10.9 cm. Echogenicity within normal limits. No mass or hydronephrosis visualized.  Left Kidney: Length: 10.8 cm. Echogenicity within normal limits. No mass or hydronephrosis visualized. Abdominal aorta: No aneurysm visualized. Other findings: Mild ascites with mild-to-moderate fluid noted over the right upper and left upper quadrants. IMPRESSION: 1.  No acute findings. 2.  Findings suggesting a degree of cirrhosis with mild ascites. Electronically Signed   By: Elberta Fortisaniel  Boyle M.D.   On: 12/07/2020 10:59   ECHOCARDIOGRAM COMPLETE  Result Date: 12/07/2020    ECHOCARDIOGRAM REPORT   Patient Name:   Gregory Stevenson Date of Exam: 12/07/2020 Medical Rec #:  [id number]   Height:       72.0 in Accession #:    [id number]  Weight:       236.0 lb Date of Birth:  January 08, 1976   BSA:          2.286 m Patient Age:    44 years    BP:           127/90 mmHg Patient Gender: M           HR:           120 bpm. Exam Location:  Inpatient Procedure: 2D Echo, Color Doppler and Cardiac Doppler Indications:    I26.02 Pulmonary embolus  History:        Patient has no prior history of Echocardiogram examinations.                 Risk Factors:Hypertension, Sleep Apnea and ETOH.  Sonographer:    Irving BurtonEmily Senior RDCS Referring Phys: [id number] ARSHAD N KAKRAKANDY IMPRESSIONS  1. Diffuse hypokinesis worse in septum /apex with abnormal septal motion . Left ventricular ejection fraction, by estimation, is 30 to 35%. The left ventricle has moderately decreased function. The left ventricle has no regional wall motion abnormalities. The left ventricular internal cavity size was mildly dilated. Left ventricular diastolic parameters are indeterminate.  2. Right ventricular systolic function is mildly reduced. The right ventricular size is mildly enlarged. There is moderately elevated pulmonary artery systolic pressure.  3. Left atrial size was mildly dilated.  4. The mitral valve is normal in structure. Mild mitral valve regurgitation. No evidence of mitral stenosis.  5. The aortic valve is tricuspid. Aortic valve regurgitation is not visualized. No  aortic stenosis is present.  6. The inferior vena cava is dilated in size with >50% respiratory variability, suggesting right atrial pressure of 8 mmHg. FINDINGS  Left Ventricle: Diffuse hypokinesis worse in septum /apex with abnormal septal motion. Left ventricular ejection fraction, by estimation, is 30 to 35%. The left ventricle has moderately decreased function. The left ventricle has no regional wall motion abnormalities. The left ventricular internal cavity size was mildly dilated. There is no left ventricular hypertrophy. Left ventricular diastolic parameters are indeterminate. Right Ventricle: The right ventricular size is mildly enlarged. No increase in right ventricular wall thickness. Right ventricular systolic function is mildly reduced. There is moderately elevated pulmonary artery systolic pressure. The tricuspid regurgitant velocity is 2.99 m/s, and with an assumed right atrial pressure of 15 mmHg, the estimated right ventricular systolic pressure is 50.8 mmHg. Left Atrium: Left atrial size was mildly dilated. Right Atrium: Right atrial size was normal in size. Pericardium: There is no evidence of pericardial effusion. Mitral Valve: The mitral valve is normal in structure. There is mild thickening of the mitral valve leaflet(s). There is mild calcification of the mitral valve leaflet(s). Mild mitral valve regurgitation. No evidence of mitral valve stenosis. Tricuspid Valve: The tricuspid valve is normal in structure. Tricuspid valve regurgitation is mild . No evidence of tricuspid stenosis. Aortic Valve: The aortic valve is tricuspid. Aortic valve regurgitation is not visualized. No aortic stenosis is present. Pulmonic Valve: The pulmonic valve was normal in structure. Pulmonic valve regurgitation is not visualized. No evidence of pulmonic stenosis. Aorta: The aortic root is normal in size and structure. Venous: The inferior vena cava is dilated in size with greater than 50% respiratory variability,  suggesting right atrial pressure of 8 mmHg. IAS/Shunts: No atrial level shunt detected by color flow Doppler.  LEFT VENTRICLE PLAX 2D LVIDd:         7.10 cm LVIDs:         5.60 cm LV PW:         0.90 cm LV IVS:        0.70 cm LVOT diam:     1.80 cm LV SV:         34 LV SV Index:   15 LVOT Area:     2.54 cm  LV Volumes (MOD) LV vol d, MOD A2C: 241.0 ml LV vol d, MOD A4C: 191.0 ml LV vol s, MOD A2C: 196.0 ml LV vol s, MOD A4C: 126.0 ml LV SV MOD A2C:     45.0 ml LV SV MOD A4C:     191.0 ml LV SV MOD BP:      60.5 ml RIGHT VENTRICLE RV S prime:     9.90 cm/s TAPSE (M-mode): 1.2 cm LEFT ATRIUM              Index       RIGHT ATRIUM           Index LA diam:        3.90 cm  1.71 cm/m  RA Area:     24.10 cm LA Vol (A2C):   103.0 ml 45.06 ml/m RA Volume:   79.10 ml  34.60 ml/m LA Vol (A4C):   49.6 ml  21.70 ml/m LA Biplane Vol: 73.5 ml  32.15 ml/m  AORTIC VALVE LVOT Vmax:   102.00 cm/s LVOT Vmean:  76.100 cm/s LVOT VTI:    0.132 m  AORTA Ao Root diam: 3.20 cm Ao Asc diam:  3.40 cm MITRAL VALVE  TRICUSPID VALVE MV Area (PHT): 4.31 cm      TR Peak grad:   35.8 mmHg MV Decel Time: 176 msec      TR Vmax:        299.00 cm/s MR Peak grad:    80.3 mmHg MR Mean grad:    50.0 mmHg   SHUNTS MR Vmax:         448.00 cm/s Systemic VTI:  0.13 m MR Vmean:        329.0 cm/s  Systemic Diam: 1.80 cm MR PISA:         1.01 cm MR PISA Eff ROA: 9 mm MR PISA Radius:  0.40 cm MV E velocity: 103.00 cm/s Charlton Haws MD Electronically signed by Charlton Haws MD Signature Date/Time: 12/07/2020/2:43:13 PM    Final    VAS [place] LOWER EXTREMITY VENOUS (DVT)  Result Date: 12/07/2020  Lower Venous DVT Study Indications: Edema, and pulmonary embolism.  Limitations: Significant edema. Comparison Study: No prior study on file Performing Technologist: Sherren Kerns RVS  Examination Guidelines: A complete evaluation includes B-mode imaging, spectral Doppler, color Doppler, and power Doppler as needed of all accessible portions of each  vessel. Bilateral testing is considered an integral part of a complete examination. Limited examinations for reoccurring indications may be performed as noted. The reflux portion of the exam is performed with the patient in reverse Trendelenburg.  +---------+---------------+---------+-----------+----------+--------------+ RIGHT    CompressibilityPhasicitySpontaneityPropertiesThrombus Aging +---------+---------------+---------+-----------+----------+--------------+ CFV      Full                                         pulsatile flow +---------+---------------+---------+-----------+----------+--------------+ SFJ      Full                                                        +---------+---------------+---------+-----------+----------+--------------+ FV Prox  Full                                                        +---------+---------------+---------+-----------+----------+--------------+ FV Mid   Full                                                        +---------+---------------+---------+-----------+----------+--------------+ FV DistalFull                                                        +---------+---------------+---------+-----------+----------+--------------+ PFV      Full                                                        +---------+---------------+---------+-----------+----------+--------------+  POP      Full                                         pulsatile flow +---------+---------------+---------+-----------+----------+--------------+ PTV      Full                                                        +---------+---------------+---------+-----------+----------+--------------+ PERO     Full                                                        +---------+---------------+---------+-----------+----------+--------------+   +---------+---------------+---------+-----------+----------+--------------+ LEFT      CompressibilityPhasicitySpontaneityPropertiesThrombus Aging +---------+---------------+---------+-----------+----------+--------------+ CFV      Full                                         pulsatile flow +---------+---------------+---------+-----------+----------+--------------+ SFJ      Full                                                        +---------+---------------+---------+-----------+----------+--------------+ FV Prox  Full                                                        +---------+---------------+---------+-----------+----------+--------------+ FV Mid   Full                                                        +---------+---------------+---------+-----------+----------+--------------+ FV DistalFull                                                        +---------+---------------+---------+-----------+----------+--------------+ PFV      Full                                                        +---------+---------------+---------+-----------+----------+--------------+ POP      Full                                         pulsatile flow +---------+---------------+---------+-----------+----------+--------------+ PTV      Full                                                        +---------+---------------+---------+-----------+----------+--------------+  PERO     Full                                                        +---------+---------------+---------+-----------+----------+--------------+     Summary: RIGHT: - There is no evidence of deep vein thrombosis in the lower extremity.  Pulsatile waveforms, consistent with fluid overload, are noted. Interstitial edema noted throughout  LEFT: - There is no evidence of deep vein thrombosis in the lower extremity.  - Pulsatile waveforms, consistent with fluid overload, are noted. Interstitial edema noted throughout.  *See table(s) above for measurements and observations.  Electronically signed by Lemar Livings MD on 12/07/2020 at 11:06:20 PM.    Final      LOS: 2 days   Lanae Boast, MD [facility]  12/09/2020, 9:40 AM

## 2020-12-10 DIAGNOSIS — R000 Tachycardia, unspecified: Secondary | ICD-10-CM | POA: Diagnosis not present

## 2020-12-10 DIAGNOSIS — I2699 Other pulmonary embolism without acute cor pulmonale: Secondary | ICD-10-CM | POA: Diagnosis not present

## 2020-12-10 DIAGNOSIS — I2694 Multiple subsegmental pulmonary emboli without acute cor pulmonale: Secondary | ICD-10-CM | POA: Diagnosis not present

## 2020-12-10 DIAGNOSIS — I420 Dilated cardiomyopathy: Secondary | ICD-10-CM | POA: Diagnosis not present

## 2020-12-10 LAB — BASIC METABOLIC PANEL
Anion gap: 14 (ref 5–15)
BUN: 12 mg/dL (ref 6–20)
CO2: 32 mmol/L (ref 22–32)
Calcium: 8.7 mg/dL — ABNORMAL LOW (ref 8.9–10.3)
Chloride: 92 mmol/L — ABNORMAL LOW (ref 98–111)
Creatinine, Ser: 0.97 mg/dL (ref 0.61–1.24)
GFR, Estimated: >60 mL/min (ref >60)
Glucose, Bld: 136 mg/dL — ABNORMAL HIGH (ref 70–99)
Potassium: 3.3 mmol/L — ABNORMAL LOW (ref 3.5–5.1)
Sodium: 138 mmol/L (ref 135–145)

## 2020-12-10 LAB — CBC
HCT: 42.8 % (ref 39.0–52.0)
Hemoglobin: 14.6 g/dL (ref 13.0–17.0)
MCH: 31.1 pg (ref 26.0–34.0)
MCHC: 34.1 g/dL (ref 30.0–36.0)
MCV: 91.1 fL (ref 80.0–100.0)
Platelets: 228 10*3/uL (ref 150–400)
RBC: 4.7 MIL/uL (ref 4.22–5.81)
RDW: 12.3 % (ref 11.5–15.5)
WBC: 4.9 10*3/uL (ref 4.0–10.5)
nRBC: 0 % (ref 0.0–0.2)

## 2020-12-10 LAB — HEPATIC FUNCTION PANEL
ALT: 19 U/L (ref 0–44)
AST: 21 U/L (ref 15–41)
Albumin: 3.4 g/dL — ABNORMAL LOW (ref 3.5–5.0)
Alkaline Phosphatase: 70 U/L (ref 38–126)
Bilirubin, Direct: 0.4 mg/dL — ABNORMAL HIGH (ref 0.0–0.2)
Indirect Bilirubin: 1.4 mg/dL — ABNORMAL HIGH (ref 0.3–0.9)
Total Bilirubin: 1.8 mg/dL — ABNORMAL HIGH (ref 0.3–1.2)
Total Protein: 6.4 g/dL — ABNORMAL LOW (ref 6.5–8.1)

## 2020-12-10 MED ORDER — SODIUM CHLORIDE 0.9 % IV BOLUS
250.0000 mL | Freq: Once | INTRAVENOUS | Status: AC
  Administered 2020-12-10: 250 mL via INTRAVENOUS

## 2020-12-10 MED ORDER — POTASSIUM CHLORIDE CRYS ER 20 MEQ PO TBCR
40.0000 meq | EXTENDED_RELEASE_TABLET | Freq: Once | ORAL | Status: AC
  Administered 2020-12-10: 40 meq via ORAL
  Filled 2020-12-10: qty 2

## 2020-12-10 MED ORDER — FUROSEMIDE 40 MG PO TABS
40.0000 mg | ORAL_TABLET | Freq: Every day | ORAL | Status: DC
  Administered 2020-12-11: 40 mg via ORAL
  Filled 2020-12-10: qty 1

## 2020-12-10 MED ORDER — SACUBITRIL-VALSARTAN 24-26 MG PO TABS
1.0000 | ORAL_TABLET | Freq: Two times a day (BID) | ORAL | Status: DC
  Administered 2020-12-11: 1 via ORAL
  Filled 2020-12-10 (×2): qty 1

## 2020-12-10 MED ORDER — CARVEDILOL 12.5 MG PO TABS
12.5000 mg | ORAL_TABLET | Freq: Two times a day (BID) | ORAL | Status: DC
  Administered 2020-12-10 – 2020-12-11 (×2): 12.5 mg via ORAL
  Filled 2020-12-10 (×2): qty 1

## 2020-12-10 NOTE — Progress Notes (Signed)
Progress Note  Patient Name: Gregory Stevenson Date of Encounter: 12/10/2020  [facility] Cardiologist: Kirk Ruths, MD   Subjective   Feels better today.  No shortness of breath.  Prior significant lower extremity edema has essentially resolved  Inpatient Medications    Scheduled Meds: . apixaban  10 mg Oral BID   Followed by  . [START ON 12/16/2020] apixaban  5 mg Oral BID  . carvedilol  6.25 mg Oral BID WC  . digoxin  0.125 mg Oral Daily  . furosemide  40 mg Intravenous BID  . influenza vac split quadrivalent PF  0.5 mL Intramuscular Tomorrow-1000  . losartan  50 mg Oral Daily  . magnesium oxide  400 mg Oral BID  . PARoxetine  20 mg Oral Daily  . potassium chloride  40 mEq Oral BID  . spironolactone  12.5 mg Oral Daily   Continuous Infusions:  PRN Meds: acetaminophen **OR** acetaminophen   Vital Signs    Vitals:   12/09/20 2300 12/10/20 0400 12/10/20 0500 12/10/20 0812  BP: 107/81 94/76  (!) 106/92  Pulse: (!) 116 (!) 103  90  Resp: 18   15  Temp: 99.1 F (37.3 C) 97.8 F (36.6 C)  98 F (36.7 C)  TempSrc: Oral Oral  Oral  SpO2: 99% 99%  97%  Weight:   95.1 kg   Height:        Intake/Output Summary (Last 24 hours) at 12/10/2020 1102 Last data filed at 12/10/2020 0900 Gross per 24 hour  Intake 240 ml  Output 2350 ml  Net -2110 ml   Last 3 Weights 12/10/2020 12/09/2020 12/06/2020  Weight (lbs) 209 lb 10.5 oz 213 lb 10 oz 236 lb  Weight (kg) 95.1 kg 96.9 kg 107.049 kg      Telemetry    Is tachycardia 112 bpm- Personally Reviewed  ECG    No new tracings - Personally Reviewed  Physical Exam   GEN: No acute distress.   Neck: No JVD Cardiac: RRR, no murmurs, rubs, or gallops.  Respiratory: Clear to auscultation bilaterally. GI: Soft, nontender, non-distended  MS: No edema; No deformity. Neuro:  Nonfocal  Psych: Normal affect   Labs    High Sensitivity Troponin:   Recent Labs  Lab 12/07/20 0040 12/07/20 0406  TROPONINIHS 27* 38*       Chemistry Recent Labs  Lab 12/06/20 1600 12/08/20 0207 12/09/20 0141 12/10/20 0247  NA 139 139 141 138  K 3.5 3.2* 3.2* 3.3*  CL 101 99 94* 92*  CO2 23 23 34* 32  GLUCOSE 162* 117* 142* 136*  BUN 14 14 10 12   CREATININE 1.17 1.05 1.07 0.97  CALCIUM 9.1 8.4* 8.8* 8.7*  PROT 6.9  --   --  6.4*  ALBUMIN 3.8  --   --  3.4*  AST 27  --   --  21  ALT 21  --   --  19  ALKPHOS 90  --   --  70  BILITOT 1.8*  --   --  1.8*  GFRNONAA >60 >60 >60 >60  ANIONGAP 15 17* 13 14     Hematology Recent Labs  Lab 12/06/20 1600 12/07/20 0406 12/10/20 0247  WBC 9.3 5.7 4.9  RBC 5.20 4.43 4.70  HGB 15.9 13.5 14.6  HCT 50.4 42.3 42.8  MCV 96.9 95.5 91.1  MCH 30.6 30.5 31.1  MCHC 31.5 31.9 34.1  RDW 12.7 12.7 12.3  PLT 303 237 228    BNP Recent Labs  Lab 12/06/20 1601  BNP 1,516.1*     DDimer No results for input(s): DDIMER in the last 168 hours.   Radiology    DG Chest 1 View  Result Date: 12/08/2020 CLINICAL DATA:  Shortness of breath with leg swelling and abdominal distension. EXAM: CHEST  1 VIEW COMPARISON:  December 06, 2020 FINDINGS: Very mild atelectasis is noted within the bilateral lung bases. Small bilateral pleural effusions are seen. These are very mildly increased in size when compared to the prior study. No pneumothorax is identified. The cardiac silhouette is markedly enlarged and unchanged in size. The visualized skeletal structures are unremarkable. IMPRESSION: Cardiomegaly with small bilateral pleural effusions and mild bibasilar atelectasis. Electronically Signed   By: Virgina Norfolk M.D.   On: 12/08/2020 20:10    Cardiac Studies   Echo 12/07/2020 1. Diffuse hypokinesis worse in septum /apex with abnormal septal motion  . Left ventricular ejection fraction, by estimation, is 30 to 35%. The  left ventricle has moderately decreased function. The left ventricle has  no regional wall motion  abnormalities. The left ventricular internal cavity size was mildly   dilated. Left ventricular diastolic parameters are indeterminate.  2. Right ventricular systolic function is mildly reduced. The right  ventricular size is mildly enlarged. There is moderately elevated  pulmonary artery systolic pressure.  3. Left atrial size was mildly dilated.  4. The mitral valve is normal in structure. Mild mitral valve  regurgitation. No evidence of mitral stenosis.  5. The aortic valve is tricuspid. Aortic valve regurgitation is not  visualized. No aortic stenosis is present.  6. The inferior vena cava is dilated in size with >50% respiratory  variability, suggesting right atrial pressure of 8 mmHg.   Patient Profile     45 year old male with past medical history of hypertension, sleep apnea, alcoholism admitted with pulmonary embolus for evaluation of acutesystolic CHF.  Assessment & Plan    1. Acute combined CHF: patient presented with SOB for the past 3 weeks. Found to have RLL segmental and subsegmental PE. Echo was without right heart strain, however showed EF 30-35%. Patient has a history of ETOH abuse which was felt to be the likely etiology of his cardiomyopathy. Started on carvedilol, losartan, digoxin, and spironolactone. He continues to be on IV lasix 51m BID with UOP -3.6L in the past 24 hours and -11.3L this admission (no intake documented). Weight is down to 209lbs today fro 236lbs on admission. Cr is stable at 0.97 - Continue carvedilol, losartan, digoxin, and spironolactone.  - Continue lasix  - Could consider transition from losartan to entresto if room in BP down the road - Anticipate outpatient coronary CTA once HR improved with management of his PE to r/o ischemia - Anticipate repeat echo in 3 months to monitor for improvement in LV function - Continue to encourage ETOH cessation  2. HTN: BP on the soft side but stable - Managed in the context of #1  3. PE: found to have RLL PE this admission. On apixaban - Continue management per  primary team        For questions or updates, please contact CHagermanPlease consult [email or web address] for contact info under        Signed, KAbigail Butts PA-C  12/10/2020, 11:02 AM     Patient seen and examined. Agree with assessment and plan. I/O -11,110 since admission weight loss from 236 down to 209 pounds.  His previous significant lower extremity edema has markedly improved with IV furosemide.  We will now decrease his IV furosemide dose which has been 40 mg twice a day to just daily.  He continues to be tachycardic with heart rate at approximately 112 and a sinus rhythm.  We will further titrate carvedilol to 12.5 mg twice a day.  We will recheck electrolytes, magnesium in am. TSH is normal.      He is on Eliquis for PE dosing met with the pharmacist today.  Troy Sine, MD, [facility] 12/10/2020 2:16 PM

## 2020-12-10 NOTE — Progress Notes (Signed)
PROGRESS NOTE    Gregory Stevenson  ZOX:[id number] DOB: 1976-07-11 DOA: 12/06/2020 PCP: Pcp, No   Chief Complaint  Patient presents with  . Shortness of Breath  . Leg Swelling  . Abdominal Distention     Brief Narrative: 45 year old male with anxiety/depression presented to the ED with shortness of breath over 3 weeks, seen in the ED COVID-19 negative work-up reviewed right lower lobe segmental and subsegmental pulmonary embolism with no heart strain also with right-sided pleural effusion with associated atelectasis, likely reactive hilar and mediastinal lymph nodes, prominent left ventricle, trace pericardial effusion, asymmetric gynecomastia, small volume simple free fluid ascites. Labs with no leukocytosis but significantly elevated proBNP 1516, troponin> 27>38. Placed on Lovenox and admitted Cardiology was consulted Patient endorsed last wt 240s- normally in 220 and noticed more abdomen distension and wt going down now and "belly going down" Echo showed new onset systolic CHF EF 30-35%. Patient being diuresed IV Lasix and initiated on goal-directed medical therapy.  Subjective: Today feels much improved in terms of his breathing and leg swelling has improved.  Although he is still on 2 L of cannula.  Assessment & Plan:  Acute systolic congestive heart failure with pleural effusion: New onset with EF 30 to 35% on echocardiogram, likely from alcohol abuse given his significant alcohol abuse but has been sober for a month. Cont w/ IV Lasix, Aldactone, losartan, digoxin and carvedilol-Coreg dose increased to 6.25 due to ongoing tachycardia.  Still tachy.Cardiologist planning for CT as an outpatient after PE has been treated Net IO Since Admission: -11,110 mL [12/10/20 1415]   Intake/Output Summary (Last 24 hours) at 12/10/2020 1415 Last data filed at 12/10/2020 0900 Gross per 24 hour  Intake 240 ml  Output 1250 ml  Net -1010 ml   Acute hypoxic respiratory failure: Due to CHF/PE, pleural  effusion, needing 2 L oxygen.  Asked nursing staff to wean his oxygen off and ambulate.  New onset diabetes hemoglobin A1c 7.3: Discussed with the patient about diagnosis, can start metformin on d.c vs JARDIANCE in the setting of systolic CHF, messaged cardiology,await recommendations .   Hypokalemia continue on scheduled potassium Hypomagnesemia cont  magnesium oxide  Recent pulmonary embolism: Likely from recent travel to [place] in [place], switched to Eliquis 1/17.    Hypertension blood pressure stable continue on multiple CHF regimen as above.    Pulmonary hypertension: Noted in the CT scan, echo shows moderate pulmonary hypertension.  Bilateral gynecomastia seen in the CTA, has severe alcohol abuse likely the etiology, it is bilateral, discussed that he needs to get a mammogram as outpatient from his PCP which he understands.  History of alcoholism, patient has been sober for 1 month as per him.  He has been counseled to quit alcohol.   Anxiety disorder: Mood is stable, continue home Paxil.  Atenolol discontinued.  Morbid obesity BMI 32, outpatient follow-up for weight loss and also sleep apnea evaluation has been advised.  Fasting lipid profile and A1c was obtained here.  Will need to follow-up with primary care physician closely.  Nutrition: Diet Order            Diet Heart Room service appropriate? Yes; Fluid consistency: Thin  Diet effective now                Body mass index is 28.43 kg/m.  DVT prophylaxis: lovenox Code Status:   Code Status: Full Code  Family Communication: plan of care discussed with patient at bedside.  Status is: Inpatient Remains inpatient  appropriate because:IV treatments appropriate due to intensity of illness or inability to take PO and Inpatient level of care appropriate due to severity of illness  Dispo: The patient is from: Home              Anticipated d/c is to: Home              Anticipated d/c date is: 1 days once cleared by  cardiology              Patient currently is not medically stable to d/c.  Consultants:see note  Procedures:see note  Culture/Microbiology No results found for: SDES, SPECREQUEST, CULT, REPTSTATUS  Other culture-see note  Medications: Scheduled Meds: . apixaban  10 mg Oral BID   Followed by  . [START ON 12/16/2020] apixaban  5 mg Oral BID  . carvedilol  6.25 mg Oral BID WC  . digoxin  0.125 mg Oral Daily  . furosemide  40 mg Intravenous BID  . influenza vac split quadrivalent PF  0.5 mL Intramuscular Tomorrow-1000  . losartan  50 mg Oral Daily  . magnesium oxide  400 mg Oral BID  . PARoxetine  20 mg Oral Daily  . potassium chloride  40 mEq Oral BID  . spironolactone  12.5 mg Oral Daily   Continuous Infusions:  Antimicrobials: Anti-infectives (From admission, onward)   None     Objective: Vitals: Today's Vitals   12/10/20 0400 12/10/20 0500 12/10/20 0744 12/10/20 0812  BP: 94/76   (!) 106/92  Pulse: (!) 103   90  Resp:    15  Temp: 97.8 F (36.6 C)   98 F (36.7 C)  TempSrc: Oral   Oral  SpO2: 99%   97%  Weight:  95.1 kg    Height:      PainSc:   0-No pain     Intake/Output Summary (Last 24 hours) at 12/10/2020 1415 Last data filed at 12/10/2020 0900 Gross per 24 hour  Intake 240 ml  Output 1250 ml  Net -1010 ml   Filed Weights   12/06/20 1556 12/09/20 0500 12/10/20 0500  Weight: 107 kg 96.9 kg 95.1 kg   Weight change: -1.8 kg  Intake/Output from previous day: 01/17 0701 - 01/18 0700 In: -  Out: 3650 [Urine:3650] Intake/Output this shift: Total I/O In: 240 [P.O.:240] Out: -   Examination: General exam: AAOx3, obese,NAD, weak appearing. HEENT:Oral mucosa moist, Ear/Nose WNL grossly, dentition normal. Respiratory system: bilaterally diminished,no wheezing or crackles,no use of accessory muscle Cardiovascular system: S1 & S2 +, No JVD,. Gastrointestinal system: Abdomen soft, NT,ND, BS+ Nervous System:Alert, awake, moving extremities and grossly  nonfocal Extremities: mild LE edema, distal peripheral pulses palpable.  Skin: No rashes,no icterus. MSK: Normal muscle bulk,tone, power  Data Reviewed: I have personally reviewed following labs and imaging studies CBC: Recent Labs  Lab 12/06/20 1600 12/07/20 0406 12/10/20 0247  WBC 9.3 5.7 4.9  HGB 15.9 13.5 14.6  HCT 50.4 42.3 42.8  MCV 96.9 95.5 91.1  PLT 303 237 228   Basic Metabolic Panel: Recent Labs  Lab 12/06/20 1600 12/08/20 0207 12/08/20 1143 12/09/20 0141 12/10/20 0247  NA 139 139  --  141 138  K 3.5 3.2*  --  3.2* 3.3*  CL 101 99  --  94* 92*  CO2 23 23  --  34* 32  GLUCOSE 162* 117*  --  142* 136*  BUN 14 14  --  10 12  CREATININE 1.17 1.05  --  1.07 0.97  CALCIUM 9.1 8.4*  --  8.8* 8.7*  MG  --  1.4* 1.5*  --   --    GFR: Estimated Creatinine Clearance: 113.9 mL/min (by C-G formula based on SCr of 0.97 mg/dL). Liver Function Tests: Recent Labs  Lab 12/06/20 1600 12/10/20 0247  AST 27 21  ALT 21 19  ALKPHOS 90 70  BILITOT 1.8* 1.8*  PROT 6.9 6.4*  ALBUMIN 3.8 3.4*   Recent Labs  Lab 12/06/20 1600  LIPASE 44   No results for input(s): AMMONIA in the last 168 hours. Coagulation Profile: Recent Labs  Lab 12/06/20 1600  INR 1.3*   Cardiac Enzymes: No results for input(s): CKTOTAL, CKMB, CKMBINDEX, TROPONINI in the last 168 hours. BNP (last 3 results) No results for input(s): PROBNP in the last 8760 hours. HbA1C: No results for input(s): HGBA1C in the last 72 hours. CBG: No results for input(s): GLUCAP in the last 168 hours. Lipid Profile: Recent Labs    12/09/20 0141  CHOL 159  HDL 26*  LDLCALC 111*  TRIG 109  CHOLHDL 6.1   Thyroid Function Tests: No results for input(s): TSH, T4TOTAL, FREET4, T3FREE, THYROIDAB in the last 72 hours. Anemia Panel: No results for input(s): VITAMINB12, FOLATE, FERRITIN, TIBC, IRON, RETICCTPCT in the last 72 hours. Sepsis Labs: Recent Labs  Lab 12/07/20 0406  PROCALCITON 0.15    Recent  Results (from the past 240 hour(s))  Resp Panel by RT-PCR (Flu A&B, Covid) Nasopharyngeal Swab     Status: None   Collection Time: 12/07/20 12:40 AM   Specimen: Nasopharyngeal Swab; Nasopharyngeal(NP) swabs in vial transport medium  Result Value Ref Range Status   SARS Coronavirus 2 by RT PCR NEGATIVE NEGATIVE Final    Comment: (NOTE) SARS-CoV-2 target nucleic acids are NOT DETECTED.  The SARS-CoV-2 RNA is generally detectable in upper respiratory specimens during the acute phase of infection. The lowest concentration of SARS-CoV-2 viral copies this assay can detect is 138 copies/mL. A negative result does not preclude SARS-Cov-2 infection and should not be used as the sole basis for treatment or other patient management decisions. A negative result may occur with  improper specimen collection/handling, submission of specimen other than nasopharyngeal swab, presence of viral mutation(s) within the areas targeted by this assay, and inadequate number of viral copies(<138 copies/mL). A negative result must be combined with clinical observations, patient history, and epidemiological information. The expected result is Negative.  Fact Sheet for Patients:  BloggerCourse.com  Fact Sheet for Healthcare Providers:  SeriousBroker.it  This test is no t yet approved or cleared by the [place] FDA and  has been authorized for detection and/or diagnosis of SARS-CoV-2 by FDA under an Emergency Use Authorization (EUA). This EUA will remain  in effect (meaning this test can be used) for the duration of the COVID-19 declaration under Section 564(b)(1) of the Act, 21 U.S.C.section 360bbb-3(b)(1), unless the authorization is terminated  or revoked sooner.       Influenza A by PCR NEGATIVE NEGATIVE Final   Influenza B by PCR NEGATIVE NEGATIVE Final    Comment: (NOTE) The Xpert Xpress SARS-CoV-2/FLU/RSV plus assay is intended as an aid in the  diagnosis of influenza from Nasopharyngeal swab specimens and should not be used as a sole basis for treatment. Nasal washings and aspirates are unacceptable for Xpert Xpress SARS-CoV-2/FLU/RSV testing.  Fact Sheet for Patients: BloggerCourse.com  Fact Sheet for Healthcare Providers: SeriousBroker.it  This test is not yet approved or cleared by the [place] FDA and has  been authorized for detection and/or diagnosis of SARS-CoV-2 by FDA under an Emergency Use Authorization (EUA). This EUA will remain in effect (meaning this test can be used) for the duration of the COVID-19 declaration under Section 564(b)(1) of the Act, 21 U.S.C. section 360bbb-3(b)(1), unless the authorization is terminated or revoked.  Performed at [facility], [address] [place], [place] [address]      Radiology Studies: DG Chest 1 View  Result Date: 12/08/2020 CLINICAL DATA:  Shortness of breath with leg swelling and abdominal distension. EXAM: CHEST  1 VIEW COMPARISON:  December 06, 2020 FINDINGS: Very mild atelectasis is noted within the bilateral lung bases. Small bilateral pleural effusions are seen. These are very mildly increased in size when compared to the prior study. No pneumothorax is identified. The cardiac silhouette is markedly enlarged and unchanged in size. The visualized skeletal structures are unremarkable. IMPRESSION: Cardiomegaly with small bilateral pleural effusions and mild bibasilar atelectasis. Electronically Signed   By: Aram Candelahaddeus  Houston M.D.   On: 12/08/2020 20:10     LOS: 3 days   Lanae Boastamesh Shaleena Crusoe, MD [facility]  12/10/2020, 2:15 PM

## 2020-12-10 NOTE — Plan of Care (Signed)
  Problem: Activity: Goal: Risk for activity intolerance will decrease Outcome: Progressing   Problem: Pain Managment: Goal: General experience of comfort will improve Outcome: Progressing   Problem: Safety: Goal: Ability to remain free from injury will improve Outcome: Progressing   

## 2020-12-10 NOTE — Progress Notes (Signed)
Heart Failure Stewardship Pharmacist Progress Note   PCP: Pcp, No PCP-Cardiologist: Olga Millers, MD    HPI:  45 yo M with PMH of HTN, sleep apnea, and alcoholism. He presented to the ED and was admitted for PE and evaluation of acute CHF. An ECHO was done on 12/07/20 and his LVEF is 30-35% and RV is mildly reduced.   Current HF Medications: Furosemide 40 mg daily Carvedilol 12.5 mg BID Losartan 50 mg daily Spironolactone 12.5 mg daily Digoxin 0.125 mg daily  Prior to admission HF Medications: None  Pertinent Lab Values: . Serum creatinine 0.97, BUN 12, Potassium 3.3, Sodium 138, BNP 1516.1, Digoxin level due around 12/13/20   Vital Signs: . Weight: 209 lbs (admission weight: 236 lbs) . Blood pressure: 110/70s  . Heart rate: 110s   Medication Assistance / Insurance Benefits Check: Does the patient have prescription insurance?  Pending Type of insurance plan: [employer] listed under current insurance - consulting CSW to get more information about this plan and if it has Part D coverage  Does the patient qualify for medication assistance through manufacturers or grants?   Pending . Eligible grants and/or patient assistance programs: Entresto . Medication assistance applications in progress: none  . Medication assistance applications approved: none Approved medication assistance renewals will be completed by: [facility]  Outpatient Pharmacy:  Prior to admission outpatient pharmacy: [facility] Is the patient willing to use [facility] TOC pharmacy at discharge? Yes Is the patient willing to transition their outpatient pharmacy to utilize a [facility] outpatient pharmacy?   Pending    Assessment: 1. Acute systolic CHF (EF 46-65%), likely due to NICM. NYHA class II symptoms. - Continue furosemide 40 mg daily. Potassium replacement ordered. - Continue carvedilol 12.5 mg BID - Losartan 50 mg daily - consider transitioning to Entresto 24/26 mg BID for further optimization.  Given that he is on a moderate dose of losartan already, he will likely be able to tolerate low dose Entresto without having significant drops in his BP - Continue spironolactone 12.5 mg daily - Consider adding Marcelline Deist / Jardiance prior to discharge pending SCr trends - Continue digoxin 0.125 mg daily - consider obtaining a digoxin level around 12/13/20   Plan: 1) Medication changes recommended at this time: - Switch losartan 50 mg daily to Entresto 24/26 mg BID  2) Patient assistance application(s): - None currently pending - [employer] is listed as active coverage - consulted CSW to see if this includes prescription insurance or if he will need additional patient assistance for his medications  3)  Education  - To be completed prior to discharge  Sharen Hones, PharmD, BCPS Heart Failure Stewardship Pharmacist Phone [phone]

## 2020-12-11 ENCOUNTER — Other Ambulatory Visit (HOSPITAL_COMMUNITY): Payer: Self-pay | Admitting: Internal Medicine

## 2020-12-11 DIAGNOSIS — F419 Anxiety disorder, unspecified: Secondary | ICD-10-CM

## 2020-12-11 DIAGNOSIS — I5043 Acute on chronic combined systolic (congestive) and diastolic (congestive) heart failure: Secondary | ICD-10-CM

## 2020-12-11 DIAGNOSIS — E118 Type 2 diabetes mellitus with unspecified complications: Secondary | ICD-10-CM

## 2020-12-11 LAB — BASIC METABOLIC PANEL
Anion gap: 10 (ref 5–15)
BUN: 17 mg/dL (ref 6–20)
CO2: 29 mmol/L (ref 22–32)
Calcium: 9 mg/dL (ref 8.9–10.3)
Chloride: 96 mmol/L — ABNORMAL LOW (ref 98–111)
Creatinine, Ser: 0.95 mg/dL (ref 0.61–1.24)
GFR, Estimated: >60 mL/min (ref >60)
Glucose, Bld: 140 mg/dL — ABNORMAL HIGH (ref 70–99)
Potassium: 4.3 mmol/L (ref 3.5–5.1)
Sodium: 135 mmol/L (ref 135–145)

## 2020-12-11 LAB — MAGNESIUM: Magnesium: 2.1 mg/dL (ref 1.7–2.4)

## 2020-12-11 LAB — BRAIN NATRIURETIC PEPTIDE: B Natriuretic Peptide: 763.6 pg/mL — ABNORMAL HIGH (ref 0.0–100.0)

## 2020-12-11 MED ORDER — SACUBITRIL-VALSARTAN 24-26 MG PO TABS: 1.0000 | ORAL_TABLET | Freq: Two times a day (BID) | ORAL | 0 refills | Status: DC

## 2020-12-11 MED ORDER — MAGNESIUM OXIDE 400 (241.3 MG) MG PO TABS: 400.0000 mg | ORAL_TABLET | Freq: Two times a day (BID) | ORAL | 0 refills | Status: DC

## 2020-12-11 MED ORDER — FUROSEMIDE 40 MG PO TABS: 40.0000 mg | ORAL_TABLET | Freq: Every day | ORAL | 0 refills | Status: DC

## 2020-12-11 MED ORDER — CARVEDILOL 12.5 MG PO TABS: 12.5000 mg | ORAL_TABLET | Freq: Two times a day (BID) | ORAL | 0 refills | Status: DC

## 2020-12-11 MED ORDER — POTASSIUM CHLORIDE CRYS ER 20 MEQ PO TBCR
20.0000 meq | EXTENDED_RELEASE_TABLET | Freq: Every day | ORAL | Status: DC
  Administered 2020-12-11: 20 meq via ORAL
  Filled 2020-12-11: qty 1

## 2020-12-11 MED ORDER — APIXABAN 5 MG PO TABS: 10.0000 mg | ORAL_TABLET | Freq: Two times a day (BID) | ORAL | 0 refills | Status: DC

## 2020-12-11 MED ORDER — SPIRONOLACTONE 25 MG PO TABS: 12.5000 mg | ORAL_TABLET | Freq: Every day | ORAL | 0 refills | Status: DC

## 2020-12-11 MED ORDER — POTASSIUM CHLORIDE CRYS ER 20 MEQ PO TBCR: 20.0000 meq | EXTENDED_RELEASE_TABLET | Freq: Every day | ORAL | 0 refills | Status: DC

## 2020-12-11 MED ORDER — DIGOXIN 125 MCG PO TABS: 0.1250 mg | ORAL_TABLET | Freq: Every day | ORAL | 0 refills | Status: DC

## 2020-12-11 MED ORDER — APIXABAN 5 MG PO TABS: 5.0000 mg | ORAL_TABLET | Freq: Two times a day (BID) | ORAL | 0 refills | Status: DC

## 2020-12-11 MED FILL — DIGOXIN 0.125 MG TABLET: 125 | 30 days supply | Qty: 30 | Fill #0

## 2020-12-11 MED FILL — MAGNESIUM OXIDE 400 MG TABS: 400 | 30 days supply | Qty: 60 | Fill #0

## 2020-12-11 MED FILL — ELIQUIS 5 MG TABLET: 5 | 30 days supply | Qty: 74 | Fill #0

## 2020-12-11 MED FILL — FUROSEMIDE 40 MG TABLET: 40 | 30 days supply | Qty: 30 | Fill #0

## 2020-12-11 MED FILL — SPIRONOLACTONE 25 MG TABLET: 25 | 30 days supply | Qty: 15 | Fill #0

## 2020-12-11 MED FILL — POTASSIUM CHLORIDE 20meqER: 20 | 30 days supply | Qty: 30 | Fill #0

## 2020-12-11 MED FILL — CARVEDILOL 12.5 MG TABLET: 12.5 | 30 days supply | Qty: 60 | Fill #0

## 2020-12-11 MED FILL — ENTRESTO 24 MG-26 MG TABLET: 24-26 | 90 days supply | Qty: 180 | Fill #0

## 2020-12-11 NOTE — Progress Notes (Addendum)
Heart Failure Patient Advocate Encounter  Was successful in obtaining a copay card for [facility].  This copay card will make the patients copay $10 per 30/60/90 day supply. Please send 90-day prescription for Entresto to [facility] pharmacy at discharge.  The billing information is as follows and has been shared with the patients pharmacy.   RxBin: [id number] PCN: [id number] Member ID: [id number] Group ID: [id number]    Was successful in obtaining a copay card for Eliquis.  This copay card will make the patients copay $10 per 30 day supply.  The billing information is as follows and has been shared with the patients pharmacy.   RxBin: [id number] PCN: [id number] Member ID: [id number] Group ID: [id number]     Sharen Hones, PharmD, BCPS Heart Failure Stewardship Pharmacist Phone [phone]

## 2020-12-11 NOTE — Progress Notes (Addendum)
Heart Failure Stewardship Pharmacist Progress Note   PCP: Pcp, No PCP-Cardiologist: Kirk Ruths, MD    HPI:  45 yo M with PMH of HTN, sleep apnea, and alcoholism. He presented to the ED and was admitted for PE and evaluation of acute CHF. An ECHO was done on 12/07/20 and his LVEF is 30-35% and RV is mildly reduced.    Current HF Medications: Furosemide 40 mg daily Carvedilol 12.5 mg BID Entresto 24/26 mg BID Spironolactone 12.5 mg daily Digoxin 0.125 mg daily  Prior to admission HF Medications: None  Pertinent Lab Values: . Serum creatinine 0.95, BUN 17, Potassium 4.3, Sodium 135, BNP 1516.1, Digoxin level due around 12/13/20   Vital Signs: . Weight: 210 lbs (admission weight: 236 lbs) . Blood pressure: 100/70-80s  . Heart rate: 90-110s   Medication Assistance / Insurance Benefits Check: Does the patient have prescription insurance?  Yes Type of insurance plan: [employer] - commercial insurance  Outpatient Pharmacy:  Prior to admission outpatient pharmacy: [place] Is the patient willing to use [place] at discharge? Yes   Assessment: 1. Acute systolic CHF (EF 37-62%), likely due to NICM. NYHA class II symptoms. - Continue furosemide 40 mg daily. Potassium replacement ordered. Recommend discharging with KCl 20 mEq daily. - Continue carvedilol 12.5 mg BID - Continue Entresto 24/26 mg BID - Continue spironolactone 12.5 mg daily - Consider adding Wilder Glade / Jardiance as an outpatient - Continue digoxin 0.125 mg daily - consider obtaining a digoxin level around 12/13/20   Plan: 1) Medication changes recommended at this time: - Agree with changes as above - Add KCl 20 mEq daily at discharge  2) Patient assistance / copay information: Geneticist, molecular with >$3,000 deductible - Entresto: $569.82 per month prior to meeting deductible (copay card brings this down to $10 per 30/60/90 day fill) - Eliquis: $489.38 per month prior to meeting  deductible (copay card brings this down to $10 per 30 day fill) - Jardiance: $536.25 per month prior to meeting deductible (copay card supports for up to $175 per 30-day supply).  - Until his deductible is met, I would recommend to hold off adding Jardiance at this time, and starting in ~1-2 months (after his deductible is met). - Unable to obtain copay information after his deductible is met, but I don't anticipate there being issues with the copay cards allowance running out before the end of the calendar year.  3)  Education  - Patient has been educated on current HF medications (Entresto, carvedilol, spironolactone, furosemide, digoxin) and potential additions to HF medication regimen (Jardiance) - Patient verbalizes understanding that over the next few months, these medication doses may change and more medications may be added to optimize HF regimen - Patient has been educated on basic disease state pathophysiology and goals of therapy - Patient was provided with "Living with Heart Failure" education packet and was given a pill box provided by HF clinic. - Time spent (30 mins)  Kerby Nora, PharmD, BCPS Heart Failure Stewardship Pharmacist Phone [phone]

## 2020-12-11 NOTE — Progress Notes (Addendum)
Progress Note  Patient Name: Gregory Stevenson Date of Encounter: 12/11/2020  [facility] Cardiologist: Olga Millers, MD   Subjective   Feeling well this morning. No shortness of breath.   Inpatient Medications    Scheduled Meds: . apixaban  10 mg Oral BID   Followed by  . [START ON 12/16/2020] apixaban  5 mg Oral BID  . carvedilol  12.5 mg Oral BID WC  . digoxin  0.125 mg Oral Daily  . furosemide  40 mg Oral Daily  . influenza vac split quadrivalent PF  0.5 mL Intramuscular Tomorrow-1000  . magnesium oxide  400 mg Oral BID  . PARoxetine  20 mg Oral Daily  . sacubitril-valsartan  1 tablet Oral BID  . spironolactone  12.5 mg Oral Daily   Continuous Infusions:  PRN Meds: acetaminophen **OR** acetaminophen   Vital Signs    Vitals:   12/10/20 2215 12/11/20 0010 12/11/20 0202 12/11/20 0422  BP: 90/75 98/75 97/75  98/82  Pulse: (!) 109 (!) 105 97 97  Resp: 12 16 16 16   Temp: 98.2 F (36.8 C) 98 F (36.7 C) (!) 97.5 F (36.4 C) 97.6 F (36.4 C)  TempSrc:  Oral Oral Oral  SpO2: 98% 98% 100% 98%  Weight:    95.6 kg  Height:       No intake or output data in the 24 hours ending 12/11/20 1015 Last 3 Weights 12/11/2020 12/10/2020 12/09/2020  Weight (lbs) 210 lb 12.2 oz 209 lb 10.5 oz 213 lb 10 oz  Weight (kg) 95.6 kg 95.1 kg 96.9 kg      Telemetry    Not on telemetry this morning  ECG    No new tracing  Physical Exam   GEN: No acute distress.   Neck: No JVD Cardiac: RRR, no murmurs, rubs, or gallops.  Respiratory: Clear to auscultation bilaterally. GI: Soft, nontender, non-distended  MS: No edema; No deformity. Neuro:  Nonfocal  Psych: Normal affect   Labs    High Sensitivity Troponin:   Recent Labs  Lab 12/07/20 0040 12/07/20 0406  TROPONINIHS 27* 38*      Chemistry Recent Labs  Lab 12/06/20 1600 12/08/20 0207 12/09/20 0141 12/10/20 0247 12/11/20 0547  NA 139   < > 141 138 135  K 3.5   < > 3.2* 3.3* 4.3  CL 101   < > 94* 92* 96*  CO2 23    < > 34* 32 29  GLUCOSE 162*   < > 142* 136* 140*  BUN 14   < > 10 12 17   CREATININE 1.17   < > 1.07 0.97 0.95  CALCIUM 9.1   < > 8.8* 8.7* 9.0  PROT 6.9  --   --  6.4*  --   ALBUMIN 3.8  --   --  3.4*  --   AST 27  --   --  21  --   ALT 21  --   --  19  --   ALKPHOS 90  --   --  70  --   BILITOT 1.8*  --   --  1.8*  --   GFRNONAA >60   < > >60 >60 >60  ANIONGAP 15   < > 13 14 10    < > = values in this interval not displayed.     Hematology Recent Labs  Lab 12/06/20 1600 12/07/20 0406 12/10/20 0247  WBC 9.3 5.7 4.9  RBC 5.20 4.43 4.70  HGB 15.9 13.5 14.6  HCT  50.4 42.3 42.8  MCV 96.9 95.5 91.1  MCH 30.6 30.5 31.1  MCHC 31.5 31.9 34.1  RDW 12.7 12.7 12.3  PLT 303 237 228    BNP Recent Labs  Lab 12/06/20 1601 12/11/20 0547  BNP 1,516.1* 763.6*     DDimer No results for input(s): DDIMER in the last 168 hours.   Radiology    No results found.  Cardiac Studies   Echo 12/07/2020 1. Diffuse hypokinesis worse in septum /apex with abnormal septal motion  . Left ventricular ejection fraction, by estimation, is 30 to 35%. The  left ventricle has moderately decreased function. The left ventricle has  no regional wall motion  abnormalities. The left ventricular internal cavity size was mildly  dilated. Left ventricular diastolic parameters are indeterminate.  2. Right ventricular systolic function is mildly reduced. The right  ventricular size is mildly enlarged. There is moderately elevated  pulmonary artery systolic pressure.  3. Left atrial size was mildly dilated.  4. The mitral valve is normal in structure. Mild mitral valve  regurgitation. No evidence of mitral stenosis.  5. The aortic valve is tricuspid. Aortic valve regurgitation is not  visualized. No aortic stenosis is present.  6. The inferior vena cava is dilated in size with >50% respiratory  variability, suggesting right atrial pressure of 8 mmHg.   Patient Profile     45 y.o. male with past  medical history of hypertension, sleep apnea, alcoholism admitted with pulmonary embolus for evaluation of acutesystolic CHF.  Assessment & Plan    1. Acute combined CHF: patient presented with SOB for the past 3 weeks. Found to have RLL segmental and subsegmental PE. Echo was without right heart strain, however showed EF 30-35%. Patient has a history of ETOH abuse which was felt to be the likely etiology of his cardiomyopathy. Now transitioned from O2 to room air.  -- Started on carvedilol, losartan, digoxin, and spironolactone. He has been transitioned to PO lasix 40mg  daily. Remains net - 10.8L. Weight is down to 210lbs today fro 236lbs on admission. Cr is stable at 0.95 -- Could consider transition from losartan to entresto in the future if BP allows -- Plan for outpatient follow up with coronary CTA once HR improved with management of his PE to r/o ischemia -- Echo in 3 months to monitor for improvement in LV function  2. HTN: BP on the soft side but stable -- continue current medications  3. PE: found to have RLL PE this admission. On apixaban -- Continue management per primary team  Will arrange for outpatient follow up appt.   For questions or updates, please contact [facility] Please consult [email or web address] for contact info under   Signed, , NP  12/11/2020, 10:15 AM     Patient seen and examined. Agree with assessment and plan.  Patient continues to feel well.  Since yesterday, he has been transitioned from losartan 50 mg to Entresto 24/26 mg twice a day with his first dose this morning.  Heart rate continues to be increased but improved in the mid to upper 90s, with suggestion of a  gallop.  He has been on titrating doses of carvedilol, now at 12.5 mg twice a day.  He also was transition from IV Lasix to oral furosemide 40 mg daily and is on spironolactone for aldosterone blockade at 12.5 mg daily.  Blood pressure is on the low side today.  BNP today remains  elevated at 736 but is improved from admission at 1516.  Net I/O is -10,870.  Plan for further titration of Entresto as outpatient with follow-up echo Doppler study in 3 months to see if LV function has improved.  He is on PE dosing on Eliquis for PE.   Lennette Bihari, MD, [facility] 12/11/2020 1:18 PM

## 2020-12-11 NOTE — Evaluation (Signed)
Occupational Therapy Evaluation Patient Details Name: Gregory Stevenson MRN: [id number] DOB: 1976-05-24 Today's Date: 12/11/2020    History of Present Illness 45 y.o. male with history of anxiety presents to the ER because of worsening shortness of breath.  CT angiogram of the chest which shows right lower lobe segmental and subsegmental pulmonary embolism with no heart strain.   Clinical Impression   Patient seen this date for the diagnosis above.  Patient states he feels good, and is hoping to go home today.  Patient is presenting at his baseline for all mobility and self care.  He has no acute care needs, and does not require any post acute rehab.  OT discontinued.  Recommend follow up with post acute appointments.      Follow Up Recommendations  No OT follow up    Equipment Recommendations  None recommended by OT    Recommendations for Other Services       Precautions / Restrictions Precautions Precautions: None Restrictions Weight Bearing Restrictions: No      Mobility Bed Mobility Overal bed mobility: Independent                  Transfers Overall transfer level: Independent Equipment used: None                  Balance Overall balance assessment: No apparent balance deficits (not formally assessed)                                         ADL either performed or assessed with clinical judgement   ADL Overall ADL's : At baseline                                       General ADL Comments: Patient requires no assist for ADL.  He is up and has been toileting himself and walking the halls.     Vision Baseline Vision/History: Wears glasses Wears Glasses: At all times Patient Visual Report: No change from baseline       Perception     Praxis      Pertinent Vitals/Pain Pain Assessment: No/denies pain     Hand Dominance Right   Extremity/Trunk Assessment Upper Extremity Assessment Upper Extremity Assessment:  Overall WFL for tasks assessed   Lower Extremity Assessment Lower Extremity Assessment: Overall WFL for tasks assessed   Cervical / Trunk Assessment Cervical / Trunk Assessment: Normal   Communication Communication Communication: No difficulties   Cognition Arousal/Alertness: Awake/alert Behavior During Therapy: WFL for tasks assessed/performed Overall Cognitive Status: Within Functional Limits for tasks assessed                                     General Comments   VSS on RA    Exercises     Shoulder Instructions      Home Living Family/patient expects to be discharged to:: Private residence Living Arrangements: Non-relatives/Friends Available Help at Discharge: Available PRN/intermittently Type of Home: House Home Access: Stairs to enter [employer] of Steps: 3 Entrance Stairs-Rails: Can reach both Home Layout: One level     Bathroom Shower/Tub: Chief Strategy Officer: Standard Bathroom Accessibility: Yes How Accessible: Accessible via walker Home Equipment: None  Prior Functioning/Environment Level of Independence: Independent        Comments: Works full time.  No assist for any care or mobility.        OT Problem List: Decreased activity tolerance      OT Treatment/Interventions:      OT Goals(Current goals can be found in the care plan section) Acute Rehab OT Goals Patient Stated Goal: Get back to work OT Goal Formulation: With patient Time For Goal Achievement: 12/11/20 Potential to Achieve Goals: Good  OT Frequency:     Barriers to D/C:  none          Co-evaluation              AM-PAC OT "6 Clicks" Daily Activity     Outcome Measure Help from another person eating meals?: None Help from another person taking care of personal grooming?: None Help from another person toileting, which includes using toliet, bedpan, or urinal?: None Help from another person bathing (including washing,  rinsing, drying)?: None Help from another person to put on and taking off regular upper body clothing?: None Help from another person to put on and taking off regular lower body clothing?: None 6 Click Score: 24   End of Session Nurse Communication: Mobility status  Activity Tolerance: Patient tolerated treatment well Patient left: in bed;with call bell/phone within reach  OT Visit Diagnosis: Unsteadiness on feet (R26.81)                Time: 3818-2993 OT Time Calculation (min): 21 min Charges:  OT General Charges $OT Visit: 1 Visit OT Evaluation $OT Eval Low Complexity: 1 Low  12/11/2020  Rich, OTR/L  Acute Rehabilitation Services  Office:  [phone]   Suzanna Obey 12/11/2020, 4:08 PM

## 2020-12-11 NOTE — Progress Notes (Signed)
PT Cancellation Note  Patient Details Name: Gregory Stevenson MRN: [id number] DOB: [birth year]   Cancelled Treatment:    Reason Eval/Treat Not Completed: PT screened, no needs identified, will sign off Per RN, patient is fully independent in his room and moving well, no concerns with mobility. PT signing off. Thank you for the referral!   Lerry Liner PT, DPT, PN1   Supplemental Physical Therapist [facility]    Pager [phone] Acute Rehab Office [phone]

## 2020-12-11 NOTE — Progress Notes (Signed)
SATURATION QUALIFICATIONS: (This note is used to comply with regulatory documentation for home oxygen)  Patient Saturations on Room Air at Rest =98%  Patient Saturations on Room Air while Ambulating = 95%  Patient does not meet criteria for home O2

## 2020-12-11 NOTE — Progress Notes (Signed)
   12/10/20 2010  Assess: MEWS Score  Temp 97.7 F (36.5 C)  BP 92/74  Pulse Rate (!) 108  Resp 13  Level of Consciousness Alert  SpO2 97 %  O2 Device Nasal Cannula  O2 Flow Rate (L/min) 0.5 L/min  Assess: MEWS Score  MEWS Temp 0  MEWS Systolic 1  MEWS Pulse 1  MEWS RR 1  MEWS LOC 0  MEWS Score 3  MEWS Score Color Yellow  Assess: if the MEWS score is Yellow or Red  Were vital signs taken at a resting state? Yes  Focused Assessment No change from prior assessment  Early Detection of Sepsis Score *See Row Information* Low  MEWS guidelines implemented *See Row Information* No, previously yellow, continue vital signs every 4 hours (pt has had HR elevated, BP soft)  Treat  MEWS Interventions Administered scheduled meds/treatments;Other (Comment) (Pt is resting comfortably)  Pain Scale 0-10  Pain Score 0  Complains of Other (Comment) (no c/o at this time)  Take Vital Signs  Increase Vital Sign Frequency  Yellow: Q 2hr X 2 then Q 4hr X 2, if remains yellow, continue Q 4hrs  Escalate  MEWS: Escalate Yellow: discuss with charge nurse/RN and consider discussing with provider and RRT  Notify: Charge Nurse/RN  Name of Charge Nurse/RN Notified Alona Bene RN  Date Charge Nurse/RN Notified 12/10/20  Time Charge Nurse/RN Notified 2045  Notify: Provider  Provider Name/Title Kyere  Date Provider Notified 12/10/20  Time Provider Notified 2050  Notification Type Page  Notification Reason Other (Comment) (reoccurring elevated HR, BP soft)  Response Other (Comment) (observation for now)  Date of Provider Response 12/10/20  Time of Provider Response 2030  Notify: Rapid Response  Name of Rapid Response RN Notified n/a  Document  Patient Outcome Other (Comment) (remains stable)  Progress note created (see row info) Yes    Pt resting comfortably. Pt reports a recent change in medicine that can to be a contributing factor. On call [facility] very responsive. Will observe for now,  but may administer gentle fluids. Nightly meds given per protocol. Awaiting further instruction.

## 2020-12-12 ENCOUNTER — Other Ambulatory Visit (HOSPITAL_COMMUNITY): Payer: Self-pay | Admitting: Internal Medicine

## 2020-12-12 MED FILL — MICROLET LANCETS MISC: 30 days supply | Qty: 100 | Fill #0

## 2020-12-12 MED FILL — METFORMIN HCL 500 MG TABS: 500 | 30 days supply | Qty: 60 | Fill #0

## 2020-12-12 MED FILL — FARXIGA 10 MG TABLET: 10 | 30 days supply | Qty: 30 | Fill #0

## 2020-12-12 MED FILL — CONTOUR NEXT STRIPS: 25 days supply | Qty: 50 | Fill #0

## 2020-12-12 MED FILL — CONTOUR NEXT ONE KIT: 30 days supply | Qty: 1 | Fill #0

## 2020-12-12 NOTE — Discharge Summary (Signed)
Physician Discharge Summary  Gregory Stevenson NID:[id number] DOB: 1976/10/10 DOA: 12/06/2020  PCP: Pcp, No  Admit date: 12/06/2020 Discharge date: 12/12/2020  Recommendations for Outpatient Follow-up:  1. Discharge to home. 2. Pt to follow up with PCP in 7-10 days. Pt should have chemistry drawn on his visit and reported to PCP. 3. Follow up with cardiology as directed. They will set up outpatient appointment and contact patient. 4. Follow up with cardiology in 3 months for follow up echocardiogram/doppler. 5. Will also need follow up coronary CTA when heart rate improves. 6. Seek ETOH rehab, must stop alcohol. 7. Check blood sugars twice daily. Once in the morning before breakfast and once in the evening before dinner. Record blood sugars and take in to visit with PCP for the PCP to review.   Follow-up Information    Darreld Mclean, PA-C Follow up on 12/23/2020.   Specialties: Physician Assistant, Cardiology Why: at 9:15am for your follow up appt Contact information: [place] [address] [phone]               Discharge Diagnoses: Principal diagnosis is #1 1. Acute systolic congestive heart failure with pleural effusion 2. Acute hypoxic respiratory failure 3. New diagnosis diabetes mellitus 4. Hypokalemia 5. Hypomagnesemia 6. Pulmonary embolus (04/2019) 7. Hypertension 8. Pulmonary hypertension 9. Bilateral gynecomastia 10. Alcoholism - 1 month sober 88. Morbid obesity 12. Anxiety disorder  Discharge Condition: Fair  Disposition: Home  Diet recommendation: Heart healthy/modified carbohydrate  Filed Weights   12/09/20 0500 12/10/20 0500 12/11/20 0422  Weight: 96.9 kg 95.1 kg 95.6 kg    History of present illness: Gregory Stevenson is a 45 y.o. male with history of anxiety presents to the ER because of worsening shortness of breath.  Patient states he usually walks around 5K easily but over the last 3 weeks noticed increasing exertional  shortness of breath lower extremity edema hardly able to do any exertion.  Denies any chest pain productive cough fever or chills.  Had gone to the urgent care where he was told that there is a lot of fluid around him and advised to come to the ER.  Patient thought his symptoms may be from [place] initially but since symptoms did not improve he sought medical help.  ED Course: In the ER patient had CT angiogram of the chest which shows right lower lobe segmental and subsegmental pulmonary embolism with no heart strain.  Does show pleural effusion and mild pericardial effusion.  Mediastinal adenopathies were seen.  Patient is started on Lovenox and admitted for further management.  Patient is hemodynamic stable otherwise for tachycardia.  Labs are significant for BNP of 1500 high sensitive troponin is pending.  COVID test is negative.  Hospital Course: 45 year old male with anxiety/depression presented to the ED with shortness of breath over 3 weeks, seen in the ED COVID-19 negative work-up reviewed right lower lobe segmental and subsegmental pulmonary embolism with no heart strain also with right-sided pleural effusion with associated atelectasis,likely reactive hilar and mediastinal lymph nodes, prominent left ventricle, trace pericardial effusion, asymmetric gynecomastia,small volume simple free fluid ascites. Labs with no leukocytosis but significantly elevated proBNP 1516, troponin>27>38. Placed on Lovenox and admitted Cardiology was consulted Patient endorsed last wt 240s- normally in 220 and noticed more abdomen distension and wt going down now and "belly going down" Echo showed new onset systolic with CHF and  EF of 30-35%. Patient was diuresed IV Lasix and initiated on goal-directed medical therapy.  The patient  will be discharged on Metformin and Jardiance. The Jardiance would be both for the patient's newly diagnosed DM II, but also for heart failure-associated risk reduction (EF 30-35%). He  will also be set up for outpatient diabetic teaching and a PCP.  I have left a message on the patient's cell phone to pick up Metformin, Jardiance, and glucometer kit from [facility] on [address].  Today's assessment: S: The patient is resting comfortably. No new complaints. He was able to ambulate in the halls on room air while maintaining saturations of 95%. O: Vitals:  Vitals:   12/11/20 1200 12/11/20 1300  BP:  100/85  Pulse:  90  Resp:  16  Temp:  98 F (36.7 C)  SpO2: 97% 99%    Exam:  Constitutional:  . The patient is awake, alert, and oriented x 3. No acute distress. Respiratory:  . No increased work of breathing. . No wheezes, rales, or rhonchi . No tactile fremitus Cardiovascular:  . Regular rate and rhythm . No murmurs, ectopy, or gallups. . No lateral PMI. No thrills. Abdomen:  . Abdomen is soft, non-tender, non-distended . No hernias, masses, or organomegaly . Normoactive bowel sounds.  Musculoskeletal:  . No cyanosis, clubbing, or edema Skin:  . No rashes, lesions, ulcers . palpation of skin: no induration or nodules Neurologic:  . CN 2-12 intact . Sensation all 4 extremities intact Psychiatric:  . Mental status o Mood, affect appropriate o Orientation to person, place, time  . judgment and insight appear intact  Discharge Instructions  Discharge Instructions    (HEART FAILURE PATIENTS) Call MD:  Anytime you have any of the following symptoms: 1) 3 pound weight gain in 24 hours or 5 pounds in 1 week 2) shortness of breath, with or without a dry hacking cough 3) swelling in the hands, feet or stomach 4) if you have to sleep on extra pillows at night in order to breathe.   Complete by: As directed    Call MD for:  difficulty breathing, headache or visual disturbances   Complete by: As directed    Call MD for:  extreme fatigue   Complete by: As directed    Call MD for:  persistant dizziness or light-headedness   Complete by: As  directed    Diet - low sodium heart healthy   Complete by: As directed    Discharge instructions   Complete by: As directed    Discharge to home. Pt to follow up with PCP in 7-10 days. Pt should have chemistry drawn on his visit and reported to PCP. Follow up with cardiology as directed. They will set up outpatient appointment and contact patient. Follow up with cardiology in 3 months for follow up echocardiogram/doppler. Will also need follow up coronary CTA when heart rate improves. Seek ETOH rehab, must stop alcohol.   Heart Failure patients record your daily weight using the same scale at the same time of day   Complete by: As directed    Increase activity slowly   Complete by: As directed    STOP any activity that causes chest pain, shortness of breath, dizziness, sweating, or exessive weakness   Complete by: As directed      Allergies as of 12/11/2020   No Known Allergies     Medication List    STOP taking these medications   APPLE CIDER VINEGAR PO   atenolol 25 MG tablet Commonly known as: TENORMIN     TAKE these medications  apixaban 5 MG Tabs tablet Commonly known as: ELIQUIS Take 2 tablets (10 mg total) by mouth 2 (two) times daily for 11 doses.   apixaban 5 MG Tabs tablet Commonly known as: ELIQUIS Take 1 tablet (5 mg total) by mouth 2 (two) times daily. Start taking on: December 18, 2020   carvedilol 12.5 MG tablet Commonly known as: COREG Take 1 tablet (12.5 mg total) by mouth 2 (two) times daily with a meal.   digoxin 0.125 MG tablet Commonly known as: LANOXIN Take 1 tablet (0.125 mg total) by mouth daily.   furosemide 40 MG tablet Commonly known as: LASIX Take 1 tablet (40 mg total) by mouth daily.   magnesium oxide 400 (241.3 Mg) MG tablet Commonly known as: MAG-OX Take 1 tablet (400 mg total) by mouth 2 (two) times daily.   PARoxetine 20 MG tablet Commonly known as: PAXIL Take 20 mg by mouth daily.   potassium chloride SA 20 MEQ  tablet Commonly known as: KLOR-CON Take 1 tablet (20 mEq total) by mouth daily.   sacubitril-valsartan 24-26 MG Commonly known as: ENTRESTO Take 1 tablet by mouth 2 (two) times daily.   spironolactone 25 MG tablet Commonly known as: ALDACTONE Take 0.5 tablets (12.5 mg total) by mouth daily.      ##These medications were phoned in to [facility] on [facility] on the morning of 12/12/2020. Message left on patient's cell phone to pick them up. Arrangements made with pharmacy for the patient to receive coupon for Jardiance.## Metformin 500 mg 1 PO bid Jardiance 10 mg 1 PO daily. Glucometer Starter kit with 100 lancets and 100 strips.  I have also contacted case management to have the patient set up for outpatient diabetic teaching and a PCP.   No Known Allergies  The results of significant diagnostics from this hospitalization (including imaging, microbiology, ancillary and laboratory) are listed below for reference.    Significant Diagnostic Studies: DG Chest 1 View  Result Date: 12/08/2020 CLINICAL DATA:  Shortness of breath with leg swelling and abdominal distension. EXAM: CHEST  1 VIEW COMPARISON:  December 06, 2020 FINDINGS: Very mild atelectasis is noted within the bilateral lung bases. Small bilateral pleural effusions are seen. These are very mildly increased in size when compared to the prior study. No pneumothorax is identified. The cardiac silhouette is markedly enlarged and unchanged in size. The visualized skeletal structures are unremarkable. IMPRESSION: Cardiomegaly with small bilateral pleural effusions and mild bibasilar atelectasis. Electronically Signed   By: Virgina Norfolk M.D.   On: 12/08/2020 20:10   DG Chest 2 View  Result Date: 12/06/2020 CLINICAL DATA:  Shortness of breath on exertion. EXAM: CHEST - 2 VIEW COMPARISON:  None. FINDINGS: Mildly increased interstitial lung markings are seen. There is mild elevation of the right hemidiaphragm with a  small right pleural effusion. No pneumothorax is seen. The cardiac silhouette is moderately enlarged. The visualized skeletal structures are unremarkable. IMPRESSION: 1. Cardiomegaly with findings consistent with mild interstitial edema. 2. Small right pleural effusion. Electronically Signed   By: Virgina Norfolk M.D.   On: 12/06/2020 21:45   CT Angio Chest PE W/Cm &/Or Wo Cm  Result Date: 12/06/2020 CLINICAL DATA:  Shortness of breath, leg swelling EXAM: CT ANGIOGRAPHY CHEST WITH CONTRAST TECHNIQUE: Multidetector CT imaging of the chest was performed using the standard protocol during bolus administration of intravenous contrast. Multiplanar CT image reconstructions and MIPs were obtained to evaluate the vascular anatomy. CONTRAST:  29m OMNIPAQUE IOHEXOL 350 MG/ML SOLN  COMPARISON:  None. FINDINGS: Cardiovascular: Satisfactory opacification of the pulmonary arteries to the segmental level. Limited evaluation at the subsegmental level due to respiratory motion artifact. Filling defect within the right lower lobe segmental and subsegmental pulmonary arteries. The main pulmonary artery is enlarged measuring up to 3.3 cm. Prominent left ventricle. No increased right to left ventricular ratio. No straightening or paradoxical bowing of the interventricular septum. Trace pericardial effusion. The thoracic aorta is normal in caliber. Mediastinum/Nodes: Borderline enlarged prevascular lymph node measuring up to 1.1 cm (7:123). Prominent hilar lymph nodes. No enlarged hilar or axillary lymph nodes. Thyroid gland, trachea, and esophagus demonstrate no significant findings. Lungs/Pleura: Right lower passive lobe atelectasis. Small to moderate volume right pleural effusion. Trace left pleural effusion. Upper Abdomen: Least small volume simple free fluid ascites. Otherwise no acute abnormality visualized. Musculoskeletal: Asymmetric left greater than right gynecomastia. No suspicious lytic or blastic osseous lesions. No  acute displaced fracture. Review of the MIP images confirms the above findings. IMPRESSION: 1. Right lower lobe segmental and subsegmental pulmonary embolus. Limited evaluation due to respiratory motion artifact. No associated right heart strain or pulmonary infarction. Enlarged main pulmonary artery suggesting pulmonary hypertension. 2. Asymmetric gynecomastia. Recommend correlation with prior cross-sectional imaging for stability. If not available, recommend further evaluation with mammogram. 3. Small to moderate volume right pleural effusion and trace left pleural effusion. Associated right lower lobe passive atelectasis. Superimposed infection/inflammation of the right lower lobe not excluded. 4. Likely reactive hilar and mediastinal lymph nodes. 5. Prominent left ventricle. 6. Trace pericardial effusion. 7. At least small volume simple free fluid ascites. These results were called by telephone at the time of interpretation on 12/06/2020 at 11:21 pm to provider [facility] , who verbally acknowledged these results. Electronically Signed   By: Iven Finn M.D.   On: 12/06/2020 23:28   US Abdomen Complete  Result Date: 12/07/2020 CLINICAL DATA:  Abdominal pain and difficulty breathing. EXAM: ABDOMEN ULTRASOUND COMPLETE COMPARISON:  None. FINDINGS: Gallbladder: No gallstones or adjacent free fluid. Gallbladder wall measures 4 mm which is nonspecific in the setting of ascites. No sonographic Murphy sign noted by sonographer. Common bile duct: Diameter: 3 mm. Liver: Mild nodular contour to liver suggesting a degree of cirrhosis without focal mass. Within normal limits in parenchymal echogenicity. Portal vein is patent on color Doppler imaging with normal direction of blood flow towards the liver. IVC: No abnormality visualized. Pancreas: Visualized portion unremarkable. Spleen: Size and appearance within normal limits. Right Kidney: Length: 10.9 cm. Echogenicity within normal limits. No mass or hydronephrosis  visualized. Left Kidney: Length: 10.8 cm. Echogenicity within normal limits. No mass or hydronephrosis visualized. Abdominal aorta: No aneurysm visualized. Other findings: Mild ascites with mild-to-moderate fluid noted over the right upper and left upper quadrants. IMPRESSION: 1.  No acute findings. 2.  Findings suggesting a degree of cirrhosis with mild ascites. Electronically Signed   By: Marin Olp M.D.   On: 12/07/2020 10:59   ECHOCARDIOGRAM COMPLETE  Result Date: 12/07/2020    ECHOCARDIOGRAM REPORT   Patient Name:   DAWIT TANKARD Date of Exam: 12/07/2020 Medical Rec #:  [id number]   Height:       72.0 in Accession #:    [id number]  Weight:       236.0 lb Date of Birth:  09-13-1976   BSA:          2.286 m Patient Age:    83 years    BP:  127/90 mmHg Patient Gender: M           HR:           120 bpm. Exam Location:  Inpatient Procedure: 2D Echo, Color Doppler and Cardiac Doppler Indications:    I26.02 Pulmonary embolus  History:        Patient has no prior history of Echocardiogram examinations.                 Risk Factors:Hypertension, Sleep Apnea and ETOH.  Sonographer:    Raquel Sarna Senior RDCS Referring Phys: [place]  1. Diffuse hypokinesis worse in septum /apex with abnormal septal motion . Left ventricular ejection fraction, by estimation, is 30 to 35%. The left ventricle has moderately decreased function. The left ventricle has no regional wall motion abnormalities. The left ventricular internal cavity size was mildly dilated. Left ventricular diastolic parameters are indeterminate.  2. Right ventricular systolic function is mildly reduced. The right ventricular size is mildly enlarged. There is moderately elevated pulmonary artery systolic pressure.  3. Left atrial size was mildly dilated.  4. The mitral valve is normal in structure. Mild mitral valve regurgitation. No evidence of mitral stenosis.  5. The aortic valve is tricuspid. Aortic valve regurgitation is not  visualized. No aortic stenosis is present.  6. The inferior vena cava is dilated in size with >50% respiratory variability, suggesting right atrial pressure of 8 mmHg. FINDINGS  Left Ventricle: Diffuse hypokinesis worse in septum /apex with abnormal septal motion. Left ventricular ejection fraction, by estimation, is 30 to 35%. The left ventricle has moderately decreased function. The left ventricle has no regional wall motion abnormalities. The left ventricular internal cavity size was mildly dilated. There is no left ventricular hypertrophy. Left ventricular diastolic parameters are indeterminate. Right Ventricle: The right ventricular size is mildly enlarged. No increase in right ventricular wall thickness. Right ventricular systolic function is mildly reduced. There is moderately elevated pulmonary artery systolic pressure. The tricuspid regurgitant velocity is 2.99 m/s, and with an assumed right atrial pressure of 15 mmHg, the estimated right ventricular systolic pressure is 50.0 mmHg. Left Atrium: Left atrial size was mildly dilated. Right Atrium: Right atrial size was normal in size. Pericardium: There is no evidence of pericardial effusion. Mitral Valve: The mitral valve is normal in structure. There is mild thickening of the mitral valve leaflet(s). There is mild calcification of the mitral valve leaflet(s). Mild mitral valve regurgitation. No evidence of mitral valve stenosis. Tricuspid Valve: The tricuspid valve is normal in structure. Tricuspid valve regurgitation is mild . No evidence of tricuspid stenosis. Aortic Valve: The aortic valve is tricuspid. Aortic valve regurgitation is not visualized. No aortic stenosis is present. Pulmonic Valve: The pulmonic valve was normal in structure. Pulmonic valve regurgitation is not visualized. No evidence of pulmonic stenosis. Aorta: The aortic root is normal in size and structure. Venous: The inferior vena cava is dilated in size with greater than 50% respiratory  variability, suggesting right atrial pressure of 8 mmHg. IAS/Shunts: No atrial level shunt detected by color flow Doppler.  LEFT VENTRICLE PLAX 2D LVIDd:         7.10 cm LVIDs:         5.60 cm LV PW:         0.90 cm LV IVS:        0.70 cm LVOT diam:     1.80 cm LV SV:         34 LV SV Index:  15 LVOT Area:     2.54 cm  LV Volumes (MOD) LV vol d, MOD A2C: 241.0 ml LV vol d, MOD A4C: 191.0 ml LV vol s, MOD A2C: 196.0 ml LV vol s, MOD A4C: 126.0 ml LV SV MOD A2C:     45.0 ml LV SV MOD A4C:     191.0 ml LV SV MOD BP:      60.5 ml RIGHT VENTRICLE RV S prime:     9.90 cm/s TAPSE (M-mode): 1.2 cm LEFT ATRIUM              Index       RIGHT ATRIUM           Index LA diam:        3.90 cm  1.71 cm/m  RA Area:     24.10 cm LA Vol (A2C):   103.0 ml 45.06 ml/m RA Volume:   79.10 ml  34.60 ml/m LA Vol (A4C):   49.6 ml  21.70 ml/m LA Biplane Vol: 73.5 ml  32.15 ml/m  AORTIC VALVE LVOT Vmax:   102.00 cm/s LVOT Vmean:  76.100 cm/s LVOT VTI:    0.132 m  AORTA Ao Root diam: 3.20 cm Ao Asc diam:  3.40 cm MITRAL VALVE                 TRICUSPID VALVE MV Area (PHT): 4.31 cm      TR Peak grad:   35.8 mmHg MV Decel Time: 176 msec      TR Vmax:        299.00 cm/s MR Peak grad:    80.3 mmHg MR Mean grad:    50.0 mmHg   SHUNTS MR Vmax:         448.00 cm/s Systemic VTI:  0.13 m MR Vmean:        329.0 cm/s  Systemic Diam: 1.80 cm MR PISA:         1.01 cm MR PISA Eff ROA: 9 mm MR PISA Radius:  0.40 cm MV E velocity: 103.00 cm/s Jenkins Rouge MD Electronically signed by Jenkins Rouge MD Signature Date/Time: 12/07/2020/2:43:13 PM    Final    VAS [place] LOWER EXTREMITY VENOUS (DVT)  Result Date: 12/07/2020  Lower Venous DVT Study Indications: Edema, and pulmonary embolism.  Limitations: Significant edema. Comparison Study: No prior study on file Performing Technologist: Sharion Dove RVS  Examination Guidelines: A complete evaluation includes B-mode imaging, spectral Doppler, color Doppler, and power Doppler as needed of all accessible  portions of each vessel. Bilateral testing is considered an integral part of a complete examination. Limited examinations for reoccurring indications may be performed as noted. The reflux portion of the exam is performed with the patient in reverse Trendelenburg.  +---------+---------------+---------+-----------+----------+--------------+ RIGHT    CompressibilityPhasicitySpontaneityPropertiesThrombus Aging +---------+---------------+---------+-----------+----------+--------------+ CFV      Full                                         pulsatile flow +---------+---------------+---------+-----------+----------+--------------+ SFJ      Full                                                        +---------+---------------+---------+-----------+----------+--------------+ FV Prox  Full                                                        +---------+---------------+---------+-----------+----------+--------------+  FV Mid   Full                                                        +---------+---------------+---------+-----------+----------+--------------+ FV DistalFull                                                        +---------+---------------+---------+-----------+----------+--------------+ PFV      Full                                                        +---------+---------------+---------+-----------+----------+--------------+ POP      Full                                         pulsatile flow +---------+---------------+---------+-----------+----------+--------------+ PTV      Full                                                        +---------+---------------+---------+-----------+----------+--------------+ PERO     Full                                                        +---------+---------------+---------+-----------+----------+--------------+   +---------+---------------+---------+-----------+----------+--------------+ LEFT      CompressibilityPhasicitySpontaneityPropertiesThrombus Aging +---------+---------------+---------+-----------+----------+--------------+ CFV      Full                                         pulsatile flow +---------+---------------+---------+-----------+----------+--------------+ SFJ      Full                                                        +---------+---------------+---------+-----------+----------+--------------+ FV Prox  Full                                                        +---------+---------------+---------+-----------+----------+--------------+ FV Mid   Full                                                        +---------+---------------+---------+-----------+----------+--------------+  FV DistalFull                                                        +---------+---------------+---------+-----------+----------+--------------+ PFV      Full                                                        +---------+---------------+---------+-----------+----------+--------------+ POP      Full                                         pulsatile flow +---------+---------------+---------+-----------+----------+--------------+ PTV      Full                                                        +---------+---------------+---------+-----------+----------+--------------+ PERO     Full                                                        +---------+---------------+---------+-----------+----------+--------------+     Summary: RIGHT: - There is no evidence of deep vein thrombosis in the lower extremity.  Pulsatile waveforms, consistent with fluid overload, are noted. Interstitial edema noted throughout  LEFT: - There is no evidence of deep vein thrombosis in the lower extremity.  - Pulsatile waveforms, consistent with fluid overload, are noted. Interstitial edema noted throughout.  *See table(s) above for measurements and observations.  Electronically signed by Servando Snare MD on 12/07/2020 at 11:06:20 PM.    Final     Microbiology: Recent Results (from the past 240 hour(s))  Resp Panel by RT-PCR (Flu A&B, Covid) Nasopharyngeal Swab     Status: None   Collection Time: 12/07/20 12:40 AM   Specimen: Nasopharyngeal Swab; Nasopharyngeal(NP) swabs in vial transport medium  Result Value Ref Range Status   SARS Coronavirus 2 by RT PCR NEGATIVE NEGATIVE Final    Comment: (NOTE) SARS-CoV-2 target nucleic acids are NOT DETECTED.  The SARS-CoV-2 RNA is generally detectable in upper respiratory specimens during the acute phase of infection. The lowest concentration of SARS-CoV-2 viral copies this assay can detect is 138 copies/mL. A negative result does not preclude SARS-Cov-2 infection and should not be used as the sole basis for treatment or other patient management decisions. A negative result may occur with  improper specimen collection/handling, submission of specimen other than nasopharyngeal swab, presence of viral mutation(s) within the areas targeted by this assay, and inadequate number of viral copies(<138 copies/mL). A negative result must be combined with clinical observations, patient history, and epidemiological information. The expected result is Negative.  Fact Sheet for Patients:  EntrepreneurPulse.com.au  Fact Sheet for Healthcare Providers:  IncredibleEmployment.be  This test is no t yet approved or cleared by the [place] and  has been authorized  for detection and/or diagnosis of SARS-CoV-2 by FDA under an Emergency Use Authorization (EUA). This EUA will remain  in effect (meaning this test can be used) for the duration of the COVID-19 declaration under Section 564(b)(1) of the Act, 21 U.S.C.section 360bbb-3(b)(1), unless the authorization is terminated  or revoked sooner.       Influenza A by PCR NEGATIVE NEGATIVE Final   Influenza B by PCR NEGATIVE  NEGATIVE Final    Comment: (NOTE) The Xpert Xpress SARS-CoV-2/FLU/RSV plus assay is intended as an aid in the diagnosis of influenza from Nasopharyngeal swab specimens and should not be used as a sole basis for treatment. Nasal washings and aspirates are unacceptable for Xpert Xpress SARS-CoV-2/FLU/RSV testing.  Fact Sheet for Patients: EntrepreneurPulse.com.au  Fact Sheet for Healthcare Providers: IncredibleEmployment.be  This test is not yet approved or cleared by the [place] FDA and has been authorized for detection and/or diagnosis of SARS-CoV-2 by FDA under an Emergency Use Authorization (EUA). This EUA will remain in effect (meaning this test can be used) for the duration of the COVID-19 declaration under Section 564(b)(1) of the Act, 21 U.S.C. section 360bbb-3(b)(1), unless the authorization is terminated or revoked.  Performed at [facility], [place] [address], [place], [place] [address]      Labs: Basic Metabolic Panel: Recent Labs  Lab 12/06/20 1600 12/08/20 0207 12/08/20 1143 12/09/20 0141 12/10/20 0247 12/11/20 0547  NA 139 139  --  141 138 135  K 3.5 3.2*  --  3.2* 3.3* 4.3  CL 101 99  --  94* 92* 96*  CO2 23 23  --  34* 32 29  GLUCOSE 162* 117*  --  142* 136* 140*  BUN 14 14  --  _0 CREATININE 1.17 1.05  --  1.07 0.97 0.95  CALCIUM 9.1 8.4*  --  8.8* 8.7* 9.0  MG  --  1.4* 1.5*  --   --  2.1   Liver Function Tests: Recent Labs  Lab 12/06/20 1600 12/10/20 0247  AST 27 21  ALT 21 19  ALKPHOS 90 70  BILITOT 1.8* 1.8*  PROT 6.9 6.4*  ALBUMIN 3.8 3.4*   Recent Labs  Lab 12/06/20 1600  LIPASE 44   No results for input(s): AMMONIA in the last 168 hours. CBC: Recent Labs  Lab 12/06/20 1600 12/07/20 0406 12/10/20 0247  WBC 9.3 5.7 4.9  HGB 15.9 13.5 14.6  HCT 50.4 42.3 42.8  MCV 96.9 95.5 91.1  PLT 303 237 228   Cardiac Enzymes: No results for input(s): CKTOTAL, CKMB, CKMBINDEX,  TROPONINI in the last 168 hours. BNP: BNP (last 3 results) Recent Labs    12/06/20 1601 12/11/20 0547  BNP 1,516.1* 763.6*    ProBNP (last 3 results) No results for input(s): PROBNP in the last 8760 hours.  CBG: No results for input(s): GLUCAP in the last 168 hours.  Principal Problem:   Pulmonary embolism (HCC) Active Problems:   Anxiety   Dilated cardiomyopathy ([place])   Tachycardia   Time coordinating discharge: 38 minutes.  Signed:        Jerimyah Vandunk, DO [facility]  12/12/2020, 7:56 AM

## 2020-12-12 NOTE — Progress Notes (Signed)
Inpatient Diabetes Program Recommendations  AACE/ADA: New Consensus Statement on Inpatient Glycemic Control (2015)  Target Ranges:  Prepandial:   less than 140 mg/dL      Peak postprandial:   less than 180 mg/dL (1-2 hours)      Critically ill patients:  140 - 180 mg/dL   Lab Results  Component Value Date   HGBA1C 7.3 (H) 12/07/2020    Late Entry:  Diabetes history:  New onset DM2  Received page from [facility] with TOC.  Patient was discharged yesterday with diagnosis of CHF and new diabetes type II with an A1C of 7.3%.    Called patient at home via phone about new diagnosis. Discussed A1C results with he and explained what an A1C is, basic pathophysiology of DM Type 2, basic home care, basic diabetes diet nutrition principles, importance of checking CBGs and maintaining good CBG control to prevent long-term and short-term complications. Reviewed signs and symptoms of hyperglycemia and hypoglycemia and how to treat hypoglycemia at home. Also reviewed blood sugar goals at home.  Educated him on CHO's and importance of limiting CHO's and watching serving sizes.  Educated on The Plate Method.  Dicussed Metformin, how it works and side effects.  Asked him to take with a meal.  Explained importance of regular exercise.  Avoid beverages with sugar.  He states he mainly drinks water and seltzers.  He states he is going to eliminate alcohol as well.   Discussed glucose monitoring.  He picked up a glucometer and will check his CBG fasting and 2-3 hrs after lunch.  He understands to bring meter with him to follow up appointments.  He states Marcelline Deist may be too expensive at $190.00/month.  Asked him to speak with cardiologist at his uncoming appointment about this.  All questions answered.    Will continue to follow while inpatient.  Thank you, Dulce Sellar, RN, BSN Diabetes Coordinator Inpatient Diabetes Program [phone] (team pager from 8a-5p)

## 2020-12-13 ENCOUNTER — Telehealth (HOSPITAL_COMMUNITY): Payer: Self-pay

## 2020-12-13 NOTE — Telephone Encounter (Signed)
Pharmacy Transitions of Care Follow-up Telephone Call  Date of discharge: 12/12/20 Discharge Diagnosis: PE  How have you been since you were released from the hospital? Patient is doing well. He has been monitoring his weight daily and has follow ups scheduled. No issues with meds at this time but he is getting his glucometer replaced today.  Medication changes made at discharge: yes  Medication changes obtained and verified? yes    Medication Accessibility:  Home Pharmacy: [facility]   Was the patient provided with refills on discharged medications? yes  Have all prescriptions been transferred from [facility] to home pharmacy? No - patient uses [facility] so Rxs will be pulled over to [facility] as the patient needs them.   Is the patient able to afford medications? Has been getting help with copays from copay cards but may need to switch to [facility] since patient is currently uninsured.    Medication Review:  APIXABEN (ELIQUIS)  Apixaban 5mg  BID started on 12/12/20 - Discussed importance of taking medication around the same time everyday  - Advised patient of medications to avoid (NSAIDs, ASA)  - Educated that Tylenol (acetaminophen) will be the preferred analgesic to prevent risk of bleeding  - Emphasized importance of monitoring for signs and symptoms of bleeding (abnormal bruising, prolonged bleeding, nose bleeds, bleeding from gums, discolored urine, black tarry stools)  - Advised patient to alert all providers of anticoagulation therapy prior to starting a new medication or having a procedure   Follow-up Appointments:  Follow up with Family Medicine scheduled with Dr. 12/14/20 on 01/16/21 and F/U with Cardiology with Dr. 01/18/21 on 12/23/20.    If their condition worsens, is the pt aware to call PCP or go to the Emergency Dept.? yes  Final Patient Assessment: Patient is doing well. No issues with medications at this time. Has  follow ups scheduled with family medicine and cardiology.

## 2020-12-20 NOTE — Progress Notes (Signed)
Cardiology Office Note:    Date:  12/23/2020   ID:  [name], DOB 24-Mar-1976, MRN [id number]  PCP:  Pcp, No  Cardiologist:  Olga Millers, MD  Electrophysiologist:  None   Referring MD: No ref. provider found   Chief Complaint: hospital follow-up of CHF  History of Present Illness:    Gregory Stevenson is a 45 y.o. male with a history of chronic systolic CHF and EF of 30-35%, recent PE diagnosed on 12/06/2020 on Eliquis, hypertension, type 2 diabetes mellitus, obstructive sleep apnea on CPAP, alcohol abuse, and anxiety who is followed by Dr. Jens Som and presents today for hospital follow-up of CHF.  Patient first seen by Dr. Jens Som during recent admission from 12/06/2020 to 12/21/20 for acute CHF after presenting with shortness of breath for over 3 weeks. Chest CTA in the ED showed small to moderate right and trace left pleural effusion and at least a small volume of free fluid ascites as well as a right lower lobe segmental and subsegmental PE with no evidence of right heart strain. Echo showed LVEF of 30-35% with normal wall motion. RV was mildly enlarged with mildly reduced systolic function and moderately elevated PASP. 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 He was started on GDMT and diuresed with IV Lasix. Net negative 10.8 L during admission. Discharge weight was 210 lb. He was discharged on Lasix 40mg  daily, Entresto 24-26mg  twice daily, Coreg 12.5mg  twice daily, Spironolactone 25mg  daily, Digoxin 0.125mg  daily, Jardiance 10mg  daily, and Eliquis. Heart rates were difficult to control during admission. This was felt to be due to PE. Outpatient coronary CTA was recommended to rule out ischemia once heart rate improved. Repeat Echo in 3 months was also recommended to reassess EF.  Patient presents today for hospital follow-up. Here alone. He is doing very well since discharge. He has continued to abstain from alcohol and is walking  every day without any problems. No shortness of breath, orthopnea, PND, or edema. His weight is stable from discharge. No chest pain. BP soft today in the office but patient asymptomatic with this. No palpitations, lightheadedness, dizziness, or near syncope/syncope. He is compliant with all of his medications and tolerating them well.    Past Medical History:  Diagnosis Date  . Alcoholic fatty liver   . Alcoholism (HCC)    sober since 10/2020  . Anxiety   . Chronic systolic CHF (congestive heart failure) (HCC)   . HTN (hypertension)   . OSA (obstructive sleep apnea)    on CPAP  . Type 2 diabetes mellitus (HCC)     History reviewed. No pertinent surgical history.  Current Medications: Current Meds  Medication Sig  . apixaban (ELIQUIS) 5 MG TABS tablet Take 2 tablets (10 mg total) by mouth 2 (two) times daily for 11 doses.  digoxin (LANOXIN) 0.125 MG tablet Take 1 tablet (0.125 mg total) by mouth daily.  . furosemide (LASIX) 40 MG tablet Take 1 tablet (40 mg total) by mouth daily. As needed for weight gain  . magnesium oxide (MAG-OX) 400 (241.3 Mg) MG tablet Take 1 tablet (400 mg total) by mouth 2 (two) times daily.  . metoprolol succinate (TOPROL-XL) 100 MG 24 hr tablet Take 1 tablet (100 mg total) by mouth daily. Take with or immediately following a meal.  . PARoxetine (PAXIL) 20 MG tablet Take 20 mg by mouth daily.  . potassium chloride SA (KLOR-CON) 20 MEQ tablet Take 1 tablet (  20 mEq total) by mouth daily.  . sacubitril-valsartan (ENTRESTO) 24-26 MG Take 1 tablet by mouth 2 (two) times daily.  Marland Kitchen spironolactone (ALDACTONE) 25 MG tablet Take 0.5 tablets (12.5 mg total) by mouth daily.  . [DISCONTINUED] carvedilol (COREG) 12.5 MG tablet Take 1 tablet (12.5 mg total) by mouth 2 (two) times daily with a meal.  . [DISCONTINUED] furosemide (LASIX) 40 MG tablet Take 1 tablet (40 mg total) by mouth daily.  . [DISCONTINUED] furosemide (LASIX) 40 MG tablet Take 1 tablet (40 mg total) by  mouth daily. As Needed for Weight Gain  . [DISCONTINUED] metoprolol succinate (TOPROL-XL) 100 MG 24 hr tablet Take 1 tablet (100 mg total) by mouth daily. Take with or immediately following a meal.     Allergies:   Patient has no known allergies.   Social History   Socioeconomic History  . Marital status: Divorced    Spouse name: Not on file  . Number of children: Not on file  . Years of education: Not on file  . Highest education level: Not on file  Occupational History  . Not on file  Tobacco Use  . Smoking status: Former Games developer  . Smokeless tobacco: Never Used  Substance and Sexual Activity  . Alcohol use: Not Currently    Comment: prior alcohol abuse; sober for 1 month  . Drug use: Never  . Sexual activity: Not on file  Other Topics Concern  . Not on file  Social History Narrative  . Not on file   Social Determinants of Health   Financial Resource Strain: Not on file  Food Insecurity: Not on file  Transportation Needs: Not on file  Physical Activity: Not on file  Stress: Not on file  Social Connections: Not on file     Family History: The patient's family history includes Diabetes Mellitus II in his maternal grandfather. There is no history of Pulmonary embolism or CAD.  ROS:   Please see the history of present illness.     EKGs/Labs/Other Studies Reviewed:    The following studies were reviewed today:  Chest CTA 12/06/2020: Impressions: 1. Right lower lobe segmental and subsegmental pulmonary embolus. Limited evaluation due to respiratory motion artifact. No associated right heart strain or pulmonary infarction. Enlarged main pulmonary artery suggesting pulmonary hypertension. 2. Asymmetric gynecomastia. Recommend correlation with prior cross-sectional imaging for stability. If not available, recommend further evaluation with mammogram. 3. Small to moderate volume right pleural effusion and trace left pleural effusion. Associated right lower lobe passive  atelectasis. Superimposed infection/inflammation of the right lower lobe not excluded. 4. Likely reactive hilar and mediastinal lymph nodes. 5. Prominent left ventricle. 6. Trace pericardial effusion. 7. At least small volume simple free fluid ascites. _______________  Echocardiogram 12/07/2020: Impressions: 1. Diffuse hypokinesis worse in septum /apex with abnormal septal motion  . Left ventricular ejection fraction, by estimation, is 30 to 35%. The  left ventricle has moderately decreased function. The left ventricle has  no regional wall motion  abnormalities. The left ventricular internal cavity size was mildly  dilated. Left ventricular diastolic parameters are indeterminate.  2. Right ventricular systolic function is mildly reduced. The right  ventricular size is mildly enlarged. There is moderately elevated  pulmonary artery systolic pressure.  3. Left atrial size was mildly dilated.  4. The mitral valve is normal in structure. Mild mitral valve  regurgitation. No evidence of mitral stenosis.  5. The aortic valve is tricuspid. Aortic valve regurgitation is not  visualized. No aortic stenosis is present.  6. The inferior vena cava is dilated in size with >50% respiratory  variability, suggesting right atrial pressure of 8 mmHg.   EKG:  EKG not ordered today.   Recent Labs: 12/07/2020: TSH 1.733 12/10/2020: ALT 19; Hemoglobin 14.6; Platelets 228 12/11/2020: B Natriuretic Peptide 763.6; BUN 17; Creatinine, Ser 0.95; Magnesium 2.1; Potassium 4.3; Sodium 135  Recent Lipid Panel    Component Value Date/Time   CHOL 159 12/09/2020 0141   TRIG 109 12/09/2020 0141   HDL 26 (L) 12/09/2020 0141   CHOLHDL 6.1 12/09/2020 0141   VLDL 22 12/09/2020 0141   LDLCALC 111 (H) 12/09/2020 0141    Physical Exam:    Vital Signs: BP (!) 88/62   Pulse 85   Ht 6' (1.829 m)   Wt 209 lb (94.8 kg)   SpO2 98%   BMI 28.35 kg/m     Wt Readings from Last 3 Encounters:  12/23/20 209 lb  (94.8 kg)  12/11/20 210 lb 12.2 oz (95.6 kg)     General: 45 y.o. male in no acute distress. HEENT: Normocephalic and atraumatic. Sclera clear. EOMs intact. Neck: Supple. No carotid bruits. No JVD. Heart: RRR. Distinct S1 and S2. No murmurs, gallops, or rubs. Radial  pulses 2+ and equal bilaterally. Lungs: No increased work of breathing. Clear to ausculation bilaterally. No wheezes, rhonchi, or rales.  Abdomen: Soft, non-distended, and non-tender to palpation.  Extremities: No lower extremity edema.    Skin: Warm and dry. Neuro: Alert and oriented x3. No focal deficits. Psych: Normal affect. Responds appropriately.  Assessment:    1. Chronic systolic CHF (congestive heart failure) (HCC)   2. Dilated cardiomyopathy (HCC)   3. Pulmonary embolism, other, unspecified chronicity, unspecified whether acute cor pulmonale present (HCC)   4. Primary hypertension   5. Type 2 diabetes mellitus without complication, without long-term current use of insulin (HCC)   6. Obstructive sleep apnea   7. Chronic systolic heart failure (HCC)     Plan:    Chronic Systolic CHF - Patient recently admitted with new onset systolic CHF. Felt to likely be due to alcohol abuse.  - Echo showed LVEF of 30-35% with normal wall motion. RV was mildly enlarged with mildly reduced systolic function and moderately elevated PASP. - Appears euvolemic on exam.  - BP soft but patient asymptomatic with this. - Stop daily Lasix and only take as needed for weight gain (3lbs in 1 day or 5lbs in 1 week). - Will stop Coreg 12.5mg  twice daily and transition to Toprol-XL 100mg  daily.  - Continue Entresto 24-26mg  twice daily. - Continue Spironolactone 25mg  daily. - Continue Digoxin 0.125mg  daily. - Continue Jardiance 10mg  daily.  - Continue daily weights and sodium/fluid restrictions.  - Will repeat Echo in 3 months to reassess LV function. Can order at next visti. - Plan at discharge was also for coronary CTA to rule out  CAD. BP too low to uptitrate beta-blocker for CTA at this time. I will see patient back in a couple of weeks after above medication changes. Can re-evaluate at that time. If BP still low and heart rate rate not at goal for CTA, may need to consider use of Ivabradine.  - Will check BMET and Magnesium today. - Will check Digoxin level at follow-up visit in a couple of weeks. Patient advised not to take morning dose on day of visit.    PE - Diagnosed with PE on 12/06/2020. Felt to be secondary to recent travel from [place] to [place]. - Continue  Eliquis for a minimum of 3 months. Will defer duration of this to PCP. - Will check CBC today.  Hypertension - History of hypertension but BP soft at 88/62 today. Patient has been closely monitoring BP at home and systolic BP consistently in low 100s. Aymptomatic with this.  - See changes above.   Type 2 Diabetes Mellitus - New diagnosis during recent admission. Hemoglobin A1c 7.3. - Looks like patient was started on Metformin and Jardiance per discharge summary. However they are not listed under home medications. Patient is going to go home and confirm that this is what he was prescribed.  - Managed by PCP.  Obstructive Sleep Apnea - Compliant with CPAP.   Alcohol Abuse - History of alcohol abuse. Patient states he stopped drinking in 10/2020 before recent admission and has not had a drink since. Congratulated him on this and continued to encourage abstaining from this.  Disposition: Follow up in 1 month with me for follow-up of BP.   Medication Adjustments/Labs and Tests Ordered: Current medicines are reviewed at length with the patient today.  Concerns regarding medicines are outlined above.  Orders Placed This Encounter  Procedures  . CBC  . Basic metabolic panel  . Magnesium   Meds ordered this encounter  Medications  . DISCONTD: furosemide (LASIX) 40 MG tablet    Sig: Take 1 tablet (40 mg total) by mouth daily. As Needed for Weight Gain     Dispense:  30 tablet    Refill:  1  . DISCONTD: metoprolol succinate (TOPROL-XL) 100 MG 24 hr tablet    Sig: Take 1 tablet (100 mg total) by mouth daily. Take with or immediately following a meal.    Dispense:  90 tablet    Refill:  3  . furosemide (LASIX) 40 MG tablet    Sig: Take 1 tablet (40 mg total) by mouth daily. As needed for weight gain    Dispense:  30 tablet    Refill:  1  . metoprolol succinate (TOPROL-XL) 100 MG 24 hr tablet    Sig: Take 1 tablet (100 mg total) by mouth daily. Take with or immediately following a meal.    Dispense:  90 tablet    Refill:  3    Patient Instructions  Medication Instructions:  Stop Carvedilol.Start Metoprolol 100 mg (1 Tablet Daily). Furosemide 40 mg( 1 Tablet Daily As Needed  For weight gain of 3 lbs. In a Day and 5 lbs. In a Week) *If you need a refill on your cardiac medications before your next appointment, please call your pharmacy*   Lab Work: CMP,CBC,Magnesium Level If you have labs (blood work) drawn today and your tests are completely normal, you will receive your results only by: Marland Kitchen. MyChart Message (if you have MyChart) OR . A paper copy in the mail If you have any lab test that is abnormal or we need to change your treatment, we will call you to review the results.   Testing/Procedures: None   Follow-Up: At [facility], you and your health needs are our priority.  As part of our continuing mission to provide you with exceptional heart care, we have created designated Provider Care Teams.  These Care Teams include your primary Cardiologist (physician) and Advanced Practice Providers (APPs -  Physician Assistants and Nurse Practitioners) who all work together to provide you with the care you need, when you need it.    Your next appointment:   2 - 3 Weeks  The format for your  next appointment:   In Person  Provider:   Marjie Skiff PA-C   Other Instructions:   Monitor Blood Pressure is Systolic less than 100  please call the office.  On next visit will check Digoxin Level. Take day before office visit. Do Not Take on Morning of visit .    Signed, Gregory Parker, PA-C  12/23/2020 5:05 PM    [facility]

## 2020-12-23 ENCOUNTER — Other Ambulatory Visit: Payer: Self-pay

## 2020-12-23 ENCOUNTER — Ambulatory Visit (INDEPENDENT_AMBULATORY_CARE_PROVIDER_SITE_OTHER): Payer: 59 | Admitting: Student

## 2020-12-23 ENCOUNTER — Encounter: Payer: Self-pay | Admitting: Student

## 2020-12-23 ENCOUNTER — Other Ambulatory Visit: Payer: Self-pay | Admitting: Student

## 2020-12-23 VITALS — BP 88/62 | HR 85 | Ht 72.0 in | Wt 209.0 lb

## 2020-12-23 DIAGNOSIS — I5022 Chronic systolic (congestive) heart failure: Secondary | ICD-10-CM | POA: Diagnosis not present

## 2020-12-23 DIAGNOSIS — E119 Type 2 diabetes mellitus without complications: Secondary | ICD-10-CM

## 2020-12-23 DIAGNOSIS — G4733 Obstructive sleep apnea (adult) (pediatric): Secondary | ICD-10-CM

## 2020-12-23 DIAGNOSIS — I2699 Other pulmonary embolism without acute cor pulmonale: Secondary | ICD-10-CM | POA: Diagnosis not present

## 2020-12-23 DIAGNOSIS — I420 Dilated cardiomyopathy: Secondary | ICD-10-CM

## 2020-12-23 DIAGNOSIS — I10 Essential (primary) hypertension: Secondary | ICD-10-CM

## 2020-12-23 MED ORDER — FUROSEMIDE 40 MG PO TABS: 40.0000 mg | ORAL_TABLET | Freq: Every day | ORAL | 1 refills | Status: DC

## 2020-12-23 MED ORDER — METOPROLOL SUCCINATE ER 100 MG PO TB24: 100.0000 mg | ORAL_TABLET | Freq: Every day | ORAL | 3 refills | Status: DC

## 2020-12-23 MED FILL — METOPROLOL SUCCINATE ER 100: 100 | 90 days supply | Qty: 90 | Fill #0

## 2020-12-23 NOTE — Patient Instructions (Signed)
Medication Instructions:  Stop Carvedilol.Start Metoprolol 100 mg (1 Tablet Daily). Furosemide 40 mg( 1 Tablet Daily As Needed  For weight gain of 3 lbs. In a Day and 5 lbs. In a Week) *If you need a refill on your cardiac medications before your next appointment, please call your pharmacy*   Lab Work: CMP,CBC,Magnesium Level If you have labs (blood work) drawn today and your tests are completely normal, you will receive your results only by: Marland Kitchen MyChart Message (if you have MyChart) OR . A paper copy in the mail If you have any lab test that is abnormal or we need to change your treatment, we will call you to review the results.   Testing/Procedures: None   Follow-Up: At [facility], you and your health needs are our priority.  As part of our continuing mission to provide you with exceptional heart care, we have created designated Provider Care Teams.  These Care Teams include your primary Cardiologist (physician) and Advanced Practice Providers (APPs -  Physician Assistants and Nurse Practitioners) who all work together to provide you with the care you need, when you need it.    Your next appointment:   2 - 3 Weeks  The format for your next appointment:   In Person  Provider:   Marjie Skiff PA-C   Other Instructions:   Monitor Blood Pressure is Systolic less than 100 please call the office.  On next visit will check Digoxin Level. Take day before office visit. Do Not Take on Morning of visit .

## 2020-12-24 LAB — CBC
Hematocrit: 48 % (ref 37.5–51.0)
Hemoglobin: 15.8 g/dL (ref 13.0–17.7)
MCH: 29.5 pg (ref 26.6–33.0)
MCHC: 32.9 g/dL (ref 31.5–35.7)
MCV: 90 fL (ref 79–97)
Platelets: 200 10*3/uL (ref 150–450)
RBC: 5.35 x10E6/uL (ref 4.14–5.80)
RDW: 12.1 % (ref 11.6–15.4)
WBC: 5.7 10*3/uL (ref 3.4–10.8)

## 2020-12-24 LAB — BASIC METABOLIC PANEL
BUN/Creatinine Ratio: 15 (ref 9–20)
BUN: 15 mg/dL (ref 6–24)
CO2: 25 mmol/L (ref 20–29)
Calcium: 10.2 mg/dL (ref 8.7–10.2)
Chloride: 96 mmol/L (ref 96–106)
Creatinine, Ser: 1.02 mg/dL (ref 0.76–1.27)
GFR calc Af Amer: 101 mL/min/{1.73_m2} (ref >59)
GFR calc non Af Amer: 88 mL/min/{1.73_m2} (ref >59)
Glucose: 139 mg/dL — ABNORMAL HIGH (ref 65–99)
Potassium: 5 mmol/L (ref 3.5–5.2)
Sodium: 139 mmol/L (ref 134–144)

## 2020-12-24 LAB — MAGNESIUM: Magnesium: 2.6 mg/dL — ABNORMAL HIGH (ref 1.6–2.3)

## 2021-01-01 MED FILL — METOPROLOL SUCCINATE ER 100: 100 | 90 days supply | Qty: 90 | Fill #0

## 2021-01-01 MED FILL — FUROSEMIDE 40 MG TAB: 40 | 30 days supply | Qty: 30 | Fill #0

## 2021-01-09 ENCOUNTER — Other Ambulatory Visit: Payer: Self-pay | Admitting: Student

## 2021-01-09 ENCOUNTER — Other Ambulatory Visit: Payer: Self-pay

## 2021-01-09 MED FILL — ELIQUIS 5 MG TABLET: 5 | 30 days supply | Qty: 60 | Fill #0

## 2021-01-09 NOTE — Telephone Encounter (Signed)
This is Dr. Crenshaw's pt 

## 2021-01-10 ENCOUNTER — Other Ambulatory Visit (HOSPITAL_COMMUNITY): Payer: Self-pay | Admitting: Cardiology

## 2021-01-10 MED ORDER — SPIRONOLACTONE 25 MG PO TABS: 12.5000 mg | ORAL_TABLET | Freq: Every day | ORAL | 0 refills | Status: DC

## 2021-01-10 MED ORDER — METOPROLOL SUCCINATE ER 100 MG PO TB24: 100.0000 mg | ORAL_TABLET | Freq: Every day | ORAL | 3 refills | Status: DC

## 2021-01-10 MED ORDER — FUROSEMIDE 40 MG PO TABS: 40.0000 mg | ORAL_TABLET | Freq: Every day | ORAL | 1 refills | Status: DC

## 2021-01-10 MED ORDER — MAGNESIUM OXIDE 400 (241.3 MG) MG PO TABS: 400.0000 mg | ORAL_TABLET | Freq: Two times a day (BID) | ORAL | 0 refills | Status: DC

## 2021-01-10 MED ORDER — SACUBITRIL-VALSARTAN 24-26 MG PO TABS: 1.0000 | ORAL_TABLET | Freq: Two times a day (BID) | ORAL | 0 refills | Status: DC

## 2021-01-10 MED ORDER — DIGOXIN 125 MCG PO TABS: 0.1250 mg | ORAL_TABLET | Freq: Every day | ORAL | 0 refills | Status: DC

## 2021-01-10 MED FILL — DIGOXIN 0.125 MG TABLET: 125 | 30 days supply | Qty: 30 | Fill #0

## 2021-01-10 MED FILL — POTASSIUM CHLORIDE CRYS ER: 20 | 30 days supply | Qty: 30 | Fill #0

## 2021-01-11 NOTE — Progress Notes (Signed)
Cardiology Office Note:    Date:  01/13/2021   ID:  Yee Joss, DOB 1976/08/22, MRN [id number]  PCP:  Pcp, No  Cardiologist:  Olga Millers, MD  Electrophysiologist:  None   Referring MD: No ref. provider found   Chief Complaint: follow-up of CHF  History of Present Illness:    Gregory Stevenson is a 45 y.o. male with a history of chronic systolic CHF and EF of 30-35%, recent PE diagnosed on 12/06/2020 on Eliquis, hypertension, type 2 diabetes mellitus, obstructive sleep apnea on CPAP, alcohol abuse, and anxiety who is followed by Dr. Jens Som and presents today for follow-up of CHF.  Patient first seen by Dr. Jens Som during recent admission from 12/06/2020 to 12/21/20 for acute CHF after presenting with shortness of breath for over 3 weeks. Chest CTA in the ED showed small to moderate right and trace left pleural effusion and at least a small volume of free fluid ascites as well as a right lower lobe segmental and subsegmental PE with no evidence of right heart strain. Echo showed LVEF of 30-35% with normal wall motion. RV was mildly enlarged with mildly reduced systolic function and moderately elevated PASP. 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 He was started on GDMT and diuresed with IV Lasix. Net negative 10.8 L during admission. Discharge weight was 210 lb. He was discharged on Lasix 40mg  daily, Entresto 24-26mg  twice daily, Coreg 12.5mg  twice daily, Spironolactone 25mg  daily, Digoxin 0.125mg  daily, Jardiance 10mg  daily, and Eliquis. Heart rates were difficult to control during admission. This was felt to be due to PE. Outpatient coronary CTA was recommended to rule out ischemia once heart rate improved. Repeat Echo in 3 months was also recommended to reassess EF.  Patient was seen by me for follow-up on 12/23/2020 at which time he was doing very well since discharge with no cardiac symptoms. His BP was soft in the office at 88/62 but  patient was asymptomatic with this. Coreg was switched to Toprol with the hopes that this would not have as much of affect on his BP. He was advised to follow-up in 2-4 weeks.   Patient presents today for follow-up. Here alone. Patient doing well since last visit. He has been tolerating the Toprol well. BP 96/66 here in the office but systolic BP in the low 100's to 110's at home. He denies any cardiac symptoms - no chest pain, shortness of breath, orthopnea, PND, edema, palpitations, lightheadedness, dizziness, or syncope. No abnormal bleeding in urine or stools. Weight stable at home.  Of note, patient does confirm today that he is taking Metformin and Farxiga (not Jardiance) for his diabetes.  Past Medical History:  Diagnosis Date  . Alcoholic fatty liver   . Alcoholism (HCC)    sober since 10/2020  . Anxiety   . Chronic systolic CHF (congestive heart failure) (HCC)   . HTN (hypertension)   . OSA (obstructive sleep apnea)    on CPAP  . Type 2 diabetes mellitus (HCC)     No past surgical history on file.  Current Medications: Current Meds  Medication Sig  . apixaban (ELIQUIS) 5 MG TABS tablet Take 5 mg by mouth 2 (two) times daily.  . dapagliflozin propanediol (FARXIGA) 10 MG TABS tablet Take 10 mg by mouth daily.  . digoxin (LANOXIN) 0.125 MG tablet TAKE 1 TABLET (0.125 MG TOTAL) BY MOUTH DAILY.  . furosemide (LASIX) 40 MG tablet Take 40 mg daily  only as needed for weight gain  . magnesium oxide (MAG-OX) 400 MG tablet TAKE 1 TABLET (400 MG TOTAL) BY MOUTH TWO TIMES DAILY.  . metFORMIN (GLUCOPHAGE) 500 MG tablet Take 500 mg by mouth 2 (two) times daily with a meal.  . metoprolol succinate (TOPROL-XL) 100 MG 24 hr tablet Take 1 tablet (100 mg total) by mouth daily. Take with or immediately following a meal.  . PARoxetine (PAXIL) 20 MG tablet Take 20 mg by mouth daily.  . potassium chloride SA (KLOR-CON) 20 MEQ tablet TAKE 1 TABLET (20 MEQ TOTAL) BY MOUTH DAILY.  .  sacubitril-valsartan (ENTRESTO) 24-26 MG Take 1 tablet by mouth 2 (two) times daily.  Marland Kitchen. spironolactone (ALDACTONE) 25 MG tablet Take 0.5 tablets (12.5 mg total) by mouth daily.  . [DISCONTINUED] apixaban (ELIQUIS) 5 MG TABS tablet Take 2 tablets (10 mg total) by mouth 2 (two) times daily for 11 doses.     Allergies:   Patient has no known allergies.   Social History   Socioeconomic History  . Marital status: Divorced    Spouse name: Not on file  . Number of children: Not on file  . Years of education: Not on file  . Highest education level: Not on file  Occupational History  . Not on file  Tobacco Use  . Smoking status: Former Games developermoker  . Smokeless tobacco: Never Used  Substance and Sexual Activity  . Alcohol use: Not Currently    Comment: prior alcohol abuse; sober for 1 month  . Drug use: Never  . Sexual activity: Not on file  Other Topics Concern  . Not on file  Social History Narrative  . Not on file   Social Determinants of Health   Financial Resource Strain: Not on file  Food Insecurity: Not on file  Transportation Needs: Not on file  Physical Activity: Not on file  Stress: Not on file  Social Connections: Not on file     Family History: The patient's family history includes Diabetes Mellitus II in his maternal grandfather. There is no history of Pulmonary embolism or CAD.  ROS:   Please see the history of present illness.     EKGs/Labs/Other Studies Reviewed:    The following studies were reviewed today:  Chest CTA 12/06/2020: Impressions: 1. Right lower lobe segmental and subsegmental pulmonary embolus. Limited evaluation due to respiratory motion artifact. No associated right heart strain or pulmonary infarction. Enlarged main pulmonary artery suggesting pulmonary hypertension. 2. Asymmetric gynecomastia. Recommend correlation with prior cross-sectional imaging for stability. If not available, recommend further evaluation with mammogram. 3. Small to  moderate volume right pleural effusion and trace left pleural effusion. Associated right lower lobe passive atelectasis. Superimposed infection/inflammation of the right lower lobe not excluded. 4. Likely reactive hilar and mediastinal lymph nodes. 5. Prominent left ventricle. 6. Trace pericardial effusion. 7. At least small volume simple free fluid ascites. _______________  Echocardiogram 12/07/2020: Impressions: 1. Diffuse hypokinesis worse in septum /apex with abnormal septal motion  . Left ventricular ejection fraction, by estimation, is 30 to 35%. The  left ventricle has moderately decreased function. The left ventricle has  no regional wall motion  abnormalities. The left ventricular internal cavity size was mildly  dilated. Left ventricular diastolic parameters are indeterminate.  2. Right ventricular systolic function is mildly reduced. The right  ventricular size is mildly enlarged. There is moderately elevated  pulmonary artery systolic pressure.  3. Left atrial size was mildly dilated.  4. The mitral valve is normal in  structure. Mild mitral valve  regurgitation. No evidence of mitral stenosis.  5. The aortic valve is tricuspid. Aortic valve regurgitation is not  visualized. No aortic stenosis is present.  6. The inferior vena cava is dilated in size with >50% respiratory  variability, suggesting right atrial pressure of 8 mmHg.   EKG:  EKG not ordered today.   Recent Labs: 12/07/2020: TSH 1.733 12/10/2020: ALT 19 12/11/2020: B Natriuretic Peptide 763.6 12/23/2020: BUN 15; Creatinine, Ser 1.02; Hemoglobin 15.8; Magnesium 2.6; Platelets 200; Potassium 5.0; Sodium 139  Recent Lipid Panel    Component Value Date/Time   CHOL 159 12/09/2020 0141   TRIG 109 12/09/2020 0141   HDL 26 (L) 12/09/2020 0141   CHOLHDL 6.1 12/09/2020 0141   VLDL 22 12/09/2020 0141   LDLCALC 111 (H) 12/09/2020 0141    Physical Exam:    Vital Signs: BP 96/66 (BP Location: Left Arm,  Patient Position: Sitting)   Pulse 90   Ht 6' (1.829 m)   Wt 209 lb (94.8 kg)   SpO2 97%   BMI 28.35 kg/m     Wt Readings from Last 3 Encounters:  01/13/21 209 lb (94.8 kg)  12/23/20 209 lb (94.8 kg)  12/11/20 210 lb 12.2 oz (95.6 kg)     General: 45 y.o. male in no acute distress. HEENT: Normocephalic and atraumatic. Sclera clear.  Neck: Supple. No carotid bruits. No JVD. Heart: RRR. Distinct S1 and S2. No murmurs, gallops, or rubs. Radial pulses 2+ and equal bilaterally. Lungs: No increased work of breathing. Clear to ausculation bilaterally. No wheezes, rhonchi, or rales.  Abdomen: Soft, non-distended, and non-tender to palpation.  MSK: Normal strength and tone for age.  Extremities: No lower extremity edema.    Skin: Warm and dry. Neuro: Alert and oriented x3. No focal deficits. Psych: Normal affect. Responds appropriately.  Assessment:    1. Chronic systolic CHF (congestive heart failure) (HCC)   2. Pulmonary embolism without acute cor pulmonale, unspecified chronicity, unspecified pulmonary embolism type (HCC)   3. Primary hypertension   4. Type 2 diabetes mellitus without complication, without long-term current use of insulin (HCC)   5. Obstructive sleep apnea   6. History of alcohol abuse     Plan:    Chronic Systolic CHF - Patient recently admitted in 11/2020 with new onset systolic CHF. Felt to likely be due to alcohol abuse.  - Echo showed LVEF of 30-35% with normal wall motion. RV was mildly enlarged with mildly reduced systolic function and moderately elevated PASP. - Appears euvolemic on exam.  - BP soft but patient asymptomatic with this so will continue current medications.  - Continue Lasix 40mg  daily as needed for weight gain (3lbs in 1 day or 5lbs in 1 week). - Conitnue Toprol-XL 100mg  daily.  - Continue Entresto 24-26mg  twice daily. - Continue Spironolactone 25mg  daily. - Continue Digoxin 0.125mg  daily. - Continue Farxiga 10mg  daily.  - Continue  daily weights and sodium/fluid restrictions.  - Will repeat Echo in 3 months to reassess LV function. Discussed with Dr. , if EF has not improved at that time, will need to consider ischemic evaluation.  - Will have patient come back for Digoxin level. He was instructed not to take the Digoxin on the morning that he comes in for lab visit.    PE - Diagnosed with PE on 12/06/2020. Felt to be secondary to recent travel from to [place]. - Continue Eliquis for a minimum of 3 months. Will defer duration of this  to PCP.  Hypertension - History of hypertension in chart but BP has been soft recently BP 96/66 in the office. Systolic BP in the low 100's to 110's at home. Asymptomatic with this.  - Continue medications for CHF as above.  Type 2 Diabetes Mellitus - New diagnosis during admission in 11/2020. Hemoglobin A1c 7.3. - On Metformin and Farxiga at home. Will provide refill of Farxiga today. - Managed by PCP.   Obstructive Sleep Apnea - Compliant with CPAP.   Alcohol Abuse - History of alcohol abuse. Patient states he stopped drinking in 10/2020 before recent admission and has not had a drink since. Congratulated him on this and continued to encourage abstaining from this.   Disposition: Follow up with Dr. Jens Som in 3 months after Echo.   Medication Adjustments/Labs and Tests Ordered: Current medicines are reviewed at length with the patient today.  Concerns regarding medicines are outlined above.  Orders Placed This Encounter  Procedures  . Digoxin level  . ECHOCARDIOGRAM COMPLETE   No orders of the defined types were placed in this encounter.   Patient Instructions  Medication Instructions:  Continue same medications *If you need a refill on your cardiac medications before your next appointment, please call your pharmacy*   Lab Work: Digoxin  Have done before your morning dose of Digoxin  Lab order enclosed   Testing/Procedures: Schedule Echo in 3  months   Follow-Up: At [facility], you and your health needs are our priority.  As part of our continuing mission to provide you with exceptional heart care, we have created designated Provider Care Teams.  These Care Teams include your primary Cardiologist (physician) and Advanced Practice Providers (APPs -  Physician Assistants and Nurse Practitioners) who all work together to provide you with the care you need, when you need it.  We recommend signing up for the patient portal called "MyChart".  Sign up information is provided on this After Visit Summary.  MyChart is used to connect with patients for Virtual Visits (Telemedicine).  Patients are able to view lab/test results, encounter notes, upcoming appointments, etc.  Non-urgent messages can be sent to your provider as well.   To learn more about what you can do with MyChart, go to [email or web address].    Your next appointment:  3 months after Echo   The format for your next appointment: Office   Provider:  Dr.Crenshaw      Signed, Corrin Parker, PA-C  01/13/2021 2:07 PM    [facility]

## 2021-01-13 ENCOUNTER — Ambulatory Visit (INDEPENDENT_AMBULATORY_CARE_PROVIDER_SITE_OTHER): Payer: 59 | Admitting: Student

## 2021-01-13 ENCOUNTER — Encounter: Payer: Self-pay | Admitting: Student

## 2021-01-13 ENCOUNTER — Other Ambulatory Visit: Payer: Self-pay

## 2021-01-13 ENCOUNTER — Other Ambulatory Visit: Payer: Self-pay | Admitting: Student

## 2021-01-13 VITALS — BP 96/66 | HR 90 | Ht 72.0 in | Wt 209.0 lb

## 2021-01-13 DIAGNOSIS — F1011 Alcohol abuse, in remission: Secondary | ICD-10-CM

## 2021-01-13 DIAGNOSIS — E119 Type 2 diabetes mellitus without complications: Secondary | ICD-10-CM

## 2021-01-13 DIAGNOSIS — I2699 Other pulmonary embolism without acute cor pulmonale: Secondary | ICD-10-CM | POA: Diagnosis not present

## 2021-01-13 DIAGNOSIS — G4733 Obstructive sleep apnea (adult) (pediatric): Secondary | ICD-10-CM

## 2021-01-13 DIAGNOSIS — I10 Essential (primary) hypertension: Secondary | ICD-10-CM | POA: Diagnosis not present

## 2021-01-13 DIAGNOSIS — I5022 Chronic systolic (congestive) heart failure: Secondary | ICD-10-CM | POA: Diagnosis not present

## 2021-01-13 MED ORDER — DAPAGLIFLOZIN PROPANEDIOL 10 MG PO TABS: 10.0000 mg | ORAL_TABLET | Freq: Every day | ORAL | 3 refills | Status: DC

## 2021-01-13 MED FILL — FARXIGA 10 MG TABLET: 10 | 90 days supply | Qty: 90 | Fill #0

## 2021-01-13 NOTE — Patient Instructions (Signed)
Medication Instructions:  Continue same medications *If you need a refill on your cardiac medications before your next appointment, please call your pharmacy*   Lab Work: Digoxin  Have done before your morning dose of Digoxin  Lab order enclosed   Testing/Procedures: Schedule Echo in 3 months   Follow-Up: At [facility], you and your health needs are our priority.  As part of our continuing mission to provide you with exceptional heart care, we have created designated Provider Care Teams.  These Care Teams include your primary Cardiologist (physician) and Advanced Practice Providers (APPs -  Physician Assistants and Nurse Practitioners) who all work together to provide you with the care you need, when you need it.  We recommend signing up for the patient portal called "MyChart".  Sign up information is provided on this After Visit Summary.  MyChart is used to connect with patients for Virtual Visits (Telemedicine).  Patients are able to view lab/test results, encounter notes, upcoming appointments, etc.  Non-urgent messages can be sent to your provider as well.   To learn more about what you can do with MyChart, go to [email or web address].    Your next appointment:  3 months after Echo   The format for your next appointment: Office   Provider:  Dr.Crenshaw

## 2021-01-16 ENCOUNTER — Encounter: Payer: Self-pay | Admitting: Internal Medicine

## 2021-01-16 ENCOUNTER — Other Ambulatory Visit: Payer: Self-pay

## 2021-01-16 ENCOUNTER — Other Ambulatory Visit: Payer: Self-pay | Admitting: Internal Medicine

## 2021-01-16 ENCOUNTER — Ambulatory Visit (INDEPENDENT_AMBULATORY_CARE_PROVIDER_SITE_OTHER): Payer: 59 | Admitting: Internal Medicine

## 2021-01-16 VITALS — BP 116/80 | HR 86 | Temp 98.2°F | Ht 72.0 in | Wt 210.0 lb

## 2021-01-16 DIAGNOSIS — F3342 Major depressive disorder, recurrent, in full remission: Secondary | ICD-10-CM | POA: Diagnosis not present

## 2021-01-16 DIAGNOSIS — Z0001 Encounter for general adult medical examination with abnormal findings: Secondary | ICD-10-CM | POA: Diagnosis not present

## 2021-01-16 DIAGNOSIS — E118 Type 2 diabetes mellitus with unspecified complications: Secondary | ICD-10-CM | POA: Diagnosis not present

## 2021-01-16 DIAGNOSIS — E785 Hyperlipidemia, unspecified: Secondary | ICD-10-CM

## 2021-01-16 DIAGNOSIS — Z23 Encounter for immunization: Secondary | ICD-10-CM | POA: Diagnosis not present

## 2021-01-16 MED ORDER — METFORMIN HCL 500 MG PO TABS: 500.0000 mg | ORAL_TABLET | Freq: Two times a day (BID) | ORAL | 1 refills | Status: DC

## 2021-01-16 MED ORDER — PAROXETINE HCL 20 MG PO TABS: 20.0000 mg | ORAL_TABLET | Freq: Every day | ORAL | 1 refills | Status: DC

## 2021-01-16 MED ORDER — ROSUVASTATIN CALCIUM 10 MG PO TABS: 10.0000 mg | ORAL_TABLET | Freq: Every day | ORAL | 1 refills | Status: DC

## 2021-01-16 MED FILL — ROSUVASTATIN CALCIUM 10 MG: 10 | 90 days supply | Qty: 90 | Fill #0

## 2021-01-16 MED FILL — PARoxetine HCL 20 MG TABS: 20 | 90 days supply | Qty: 90 | Fill #0

## 2021-01-16 MED FILL — METFORMIN HCL 500 MG TABS: 500 | 90 days supply | Qty: 180 | Fill #0

## 2021-01-16 NOTE — Progress Notes (Signed)
Subjective:  Patient ID: Gregory Stevenson, male    DOB: 09-22-1976  Age: 44 y.o. MRN: [id number]  CC: Annual Exam, Diabetes, Hyperlipidemia, and Congestive Heart Failure  This visit occurred during the SARS-CoV-2 public health emergency.  Safety protocols were in place, including screening questions prior to the visit, additional usage of staff PPE, and extensive cleaning of exam room while observing appropriate contact time as indicated for disinfecting solutions.    HPI Gregory Stevenson presents for a CPX.  He was admitted about 6 weeks ago for alcohol induced cardiomyopathy and pulmonary embolus.  He tells me he has abstained from alcohol intake for the last 2 months.  He is feeling much better and denies any recent episodes of chest pain, shortness of breath, palpitations, edema, or fatigue.  History Gregory Stevenson has a past medical history of Alcoholic fatty liver, Alcoholism (HCC), Anxiety, Chronic systolic CHF (congestive heart failure) (HCC), HTN (hypertension), OSA (obstructive sleep apnea), and Type 2 diabetes mellitus (HCC).   He has no past surgical history on file.   His family history includes Diabetes Mellitus II in his maternal grandfather.He reports that he has quit smoking. He has never used smokeless tobacco. He reports previous alcohol use. He reports that he does not use drugs.  Outpatient Medications Prior to Visit  Medication Sig Dispense Refill  . apixaban (ELIQUIS) 5 MG TABS tablet Take 5 mg by mouth 2 (two) times daily.    . dapagliflozin propanediol (FARXIGA) 10 MG TABS tablet Take 1 tablet (10 mg total) by mouth daily. 90 tablet 3  . digoxin (LANOXIN) 0.125 MG tablet TAKE 1 TABLET (0.125 MG TOTAL) BY MOUTH DAILY. 30 tablet 0  . metoprolol succinate (TOPROL-XL) 100 MG 24 hr tablet Take 1 tablet (100 mg total) by mouth daily. Take with or immediately following a meal. 90 tablet 3  . potassium chloride SA (KLOR-CON) 20 MEQ tablet TAKE 1 TABLET (20 MEQ TOTAL) BY MOUTH DAILY. 30  tablet 0  . sacubitril-valsartan (ENTRESTO) 24-26 MG Take 1 tablet by mouth 2 (two) times daily. 180 tablet 0  . spironolactone (ALDACTONE) 25 MG tablet Take 0.5 tablets (12.5 mg total) by mouth daily. 30 tablet 0  . metFORMIN (GLUCOPHAGE) 500 MG tablet Take 500 mg by mouth 2 (two) times daily with a meal.    . PARoxetine (PAXIL) 20 MG tablet Take 20 mg by mouth daily.    . furosemide (LASIX) 40 MG tablet Take 40 mg daily only as needed for weight gain (Patient not taking: Reported on 01/16/2021) 90 tablet 3  . magnesium oxide (MAG-OX) 400 MG tablet TAKE 1 TABLET (400 MG TOTAL) BY MOUTH TWO TIMES DAILY. (Patient not taking: Reported on 01/16/2021) 60 tablet 0   No facility-administered medications prior to visit.    ROS Review of Systems  Constitutional: Negative for chills, diaphoresis, fatigue and fever.  HENT: Negative.   Eyes: Negative.   Respiratory: Negative for cough, chest tightness, shortness of breath and wheezing.   Cardiovascular: Negative for chest pain, palpitations and leg swelling.  Gastrointestinal: Negative for abdominal pain, constipation, diarrhea, nausea and vomiting.  Endocrine: Negative.  Negative for polydipsia, polyphagia and polyuria.  Genitourinary: Negative.  Negative for difficulty urinating.  Musculoskeletal: Negative for arthralgias and myalgias.  Skin: Negative.  Negative for color change and pallor.  Neurological: Negative.  Negative for dizziness, weakness, light-headedness and numbness.  Hematological: Negative for adenopathy. Does not bruise/bleed easily.  Psychiatric/Behavioral: Negative.     Objective:  BP 116/80 (BP Location: Right Arm,  Patient Position: Sitting, Cuff Size: Normal)   Pulse 86   Temp 98.2 F (36.8 C) (Oral)   Ht 6' (1.829 m)   Wt 210 lb (95.3 kg)   SpO2 99%   BMI 28.48 kg/m   Physical Exam Vitals reviewed.  Constitutional:      Appearance: Normal appearance.  HENT:     Nose: Nose normal.     Mouth/Throat:     Mouth:  Mucous membranes are moist.  Eyes:     General: No scleral icterus.    Conjunctiva/sclera: Conjunctivae normal.  Cardiovascular:     Rate and Rhythm: Normal rate and regular rhythm.     Heart sounds: S1 normal and S2 normal. Gallop present.   Pulmonary:     Effort: Pulmonary effort is normal.     Breath sounds: Examination of the right-lower field reveals rales. Examination of the left-lower field reveals rales. Rales present. No decreased breath sounds, wheezing or rhonchi.  Abdominal:     General: Abdomen is flat.     Palpations: There is no mass.     Tenderness: There is no abdominal tenderness. There is no guarding.  Musculoskeletal:        General: Normal range of motion.     Cervical back: Neck supple.     Right lower leg: No edema.     Left lower leg: No edema.  Skin:    General: Skin is warm and dry.     Coloration: Skin is not pale.  Neurological:     General: No focal deficit present.     Mental Status: He is alert.  Psychiatric:        Mood and Affect: Mood normal.        Behavior: Behavior normal.     Lab Results  Component Value Date   WBC 5.7 12/23/2020   HGB 15.8 12/23/2020   HCT 48.0 12/23/2020   PLT 200 12/23/2020   GLUCOSE 139 (H) 12/23/2020   CHOL 159 12/09/2020   TRIG 109 12/09/2020   HDL 26 (L) 12/09/2020   LDLCALC 111 (H) 12/09/2020   ALT 19 12/10/2020   AST 21 12/10/2020   NA 139 12/23/2020   K 5.0 12/23/2020   CL 96 12/23/2020   CREATININE 1.02 12/23/2020   BUN 15 12/23/2020   CO2 25 12/23/2020   TSH 1.733 12/07/2020   INR 1.3 (H) 12/06/2020   HGBA1C 7.3 (H) 12/07/2020    Assessment & Plan:   Gregory Stevenson was seen today for annual exam, diabetes, hyperlipidemia and congestive heart failure.  Diagnoses and all orders for this visit:  Hyperlipidemia LDL goal <70- I recommended that he start a statin for cardiovascular risk reduction. -     rosuvastatin (CRESTOR) 10 MG tablet; Take 1 tablet (10 mg total) by mouth daily.  Type II diabetes  mellitus with complication (HCC)- Will continue the combination of Metformin and an SGLT2 inhibitor. -     HM Diabetes Foot Exam -     Ambulatory referral to Ophthalmology -     metFORMIN (GLUCOPHAGE) 500 MG tablet; Take 1 tablet (500 mg total) by mouth 2 (two) times daily with a meal.  Encounter for general adult medical examination with abnormal findings- Exam completed, labs reviewed, vaccines reviewed and updated, no cancer screenings are indicated, patient education material was given.  Recurrent major depressive disorder, in full remission [facility])- He requests a refill of this.  He tells me he is doing well on the current dose. -  PARoxetine (PAXIL) 20 MG tablet; Take 1 tablet (20 mg total) by mouth daily.  Other orders -     Tdap vaccine greater than or equal to 7yo IM -     Pneumococcal polysaccharide vaccine 23-valent greater than or equal to 2yo subcutaneous/IM   I have discontinued Courtney Rokosz's magnesium oxide. I have also changed his metFORMIN and PARoxetine. Additionally, I am having him start on rosuvastatin. Lastly, I am having him maintain his digoxin, potassium chloride SA, sacubitril-valsartan, metoprolol succinate, spironolactone, apixaban, furosemide, and dapagliflozin propanediol.  Meds ordered this encounter  Medications  . metFORMIN (GLUCOPHAGE) 500 MG tablet    Sig: Take 1 tablet (500 mg total) by mouth 2 (two) times daily with a meal.    Dispense:  180 tablet    Refill:  1  . PARoxetine (PAXIL) 20 MG tablet    Sig: Take 1 tablet (20 mg total) by mouth daily.    Dispense:  90 tablet    Refill:  1  . rosuvastatin (CRESTOR) 10 MG tablet    Sig: Take 1 tablet (10 mg total) by mouth daily.    Dispense:  90 tablet    Refill:  1     Follow-up: Return in about 6 months (around 07/16/2021).  Sanda Linger, MD

## 2021-01-16 NOTE — Patient Instructions (Signed)

## 2021-01-19 ENCOUNTER — Encounter: Payer: Self-pay | Admitting: Internal Medicine

## 2021-01-23 ENCOUNTER — Encounter: Payer: Self-pay | Admitting: Internal Medicine

## 2021-02-14 ENCOUNTER — Telehealth: Payer: Self-pay | Admitting: Internal Medicine

## 2021-02-14 ENCOUNTER — Other Ambulatory Visit: Payer: Self-pay | Admitting: Cardiology

## 2021-02-14 ENCOUNTER — Telehealth: Payer: Self-pay | Admitting: Student

## 2021-02-14 MED ORDER — APIXABAN 5 MG PO TABS: 5.0000 mg | ORAL_TABLET | Freq: Two times a day (BID) | ORAL | 1 refills | Status: DC

## 2021-02-14 MED ORDER — APIXABAN 5 MG PO TABS: 5.0000 mg | ORAL_TABLET | Freq: Two times a day (BID) | ORAL | 3 refills | Status: DC

## 2021-02-14 MED FILL — DIGOXIN 0.125 MG TABLET: 125 | 90 days supply | Qty: 90 | Fill #0

## 2021-02-14 MED FILL — ELIQUIS 5 MG TABLET: 5 | 90 days supply | Qty: 180 | Fill #0

## 2021-02-14 NOTE — Telephone Encounter (Signed)
Patient called and was wondering if he still needed to be taking apixaban (ELIQUIS) 5 MG TABS tablet. He can be reached [phone].Please advise

## 2021-02-14 NOTE — Telephone Encounter (Signed)
[facility] - [place], [place] - [address] [facility] [address]. Phone:  [phone]  Fax:  [phone]     Patient needs a refill, he is completely out. He doesn't know if he should be off of the medication for the whole weekend if it cant be called in

## 2021-02-14 NOTE — Telephone Encounter (Signed)
Eliquis appears to be prescribed by [facility] Patient has no Eliquis remaining. Message sent to [facility]

## 2021-02-14 NOTE — Telephone Encounter (Signed)
Spoke with pt, aware refill sent to the pharmacy. 

## 2021-02-14 NOTE — Telephone Encounter (Signed)
*  STAT* If patient is at the pharmacy, call can be transferred to refill team.   1. Which medications need to be refilled? (please list name of each medication and dose if known)  apixaban (ELIQUIS) 5 MG TABS tablet  2. Which pharmacy/location (including street and city if local pharmacy) is medication to be sent to? [facility] - [place], [place] - [address] [facility] [address] [facility].  3. Do they need a 30 day or 90 day supply? 90  Patient is out of medication

## 2021-03-11 ENCOUNTER — Other Ambulatory Visit (HOSPITAL_COMMUNITY): Payer: Self-pay

## 2021-03-11 ENCOUNTER — Other Ambulatory Visit: Payer: Self-pay | Admitting: Cardiology

## 2021-03-11 MED ORDER — SACUBITRIL-VALSARTAN 24-26 MG PO TABS
1.0000 | ORAL_TABLET | Freq: Two times a day (BID) | ORAL | 0 refills | Status: DC
  Filled 2021-03-11: qty 180, 90d supply, fill #0

## 2021-03-11 MED ORDER — SPIRONOLACTONE 25 MG PO TABS
12.5000 mg | ORAL_TABLET | Freq: Every day | ORAL | 0 refills | Status: DC
  Filled 2021-03-11: qty 45, 90d supply, fill #0

## 2021-03-17 ENCOUNTER — Encounter: Payer: Self-pay | Admitting: Cardiology

## 2021-04-02 ENCOUNTER — Other Ambulatory Visit (HOSPITAL_COMMUNITY): Payer: Self-pay

## 2021-04-11 ENCOUNTER — Ambulatory Visit (HOSPITAL_COMMUNITY): Payer: 59 | Attending: Internal Medicine

## 2021-04-11 ENCOUNTER — Other Ambulatory Visit: Payer: Self-pay

## 2021-04-11 DIAGNOSIS — I2699 Other pulmonary embolism without acute cor pulmonale: Secondary | ICD-10-CM | POA: Insufficient documentation

## 2021-04-11 DIAGNOSIS — F1011 Alcohol abuse, in remission: Secondary | ICD-10-CM | POA: Diagnosis present

## 2021-04-11 DIAGNOSIS — E119 Type 2 diabetes mellitus without complications: Secondary | ICD-10-CM | POA: Insufficient documentation

## 2021-04-11 DIAGNOSIS — I5022 Chronic systolic (congestive) heart failure: Secondary | ICD-10-CM | POA: Insufficient documentation

## 2021-04-11 DIAGNOSIS — G4733 Obstructive sleep apnea (adult) (pediatric): Secondary | ICD-10-CM | POA: Diagnosis present

## 2021-04-11 DIAGNOSIS — I10 Essential (primary) hypertension: Secondary | ICD-10-CM | POA: Diagnosis not present

## 2021-04-11 LAB — ECHOCARDIOGRAM COMPLETE
MV M vel: 4.84 m/s
MV Peak grad: 93.7 mmHg
S' Lateral: 5.3 cm

## 2021-04-11 MED ORDER — PERFLUTREN LIPID MICROSPHERE
1.0000 mL | INTRAVENOUS | Status: AC | PRN
  Administered 2021-04-11: 2 mL via INTRAVENOUS

## 2021-04-14 ENCOUNTER — Other Ambulatory Visit: Payer: Self-pay | Admitting: Cardiology

## 2021-04-14 ENCOUNTER — Other Ambulatory Visit (HOSPITAL_COMMUNITY): Payer: Self-pay

## 2021-04-14 ENCOUNTER — Other Ambulatory Visit: Payer: Self-pay

## 2021-04-14 MED FILL — Dapagliflozin Propanediol Tab 10 MG (Base Equivalent): ORAL | 90 days supply | Qty: 90 | Fill #0 | Status: AC

## 2021-04-14 MED FILL — Rosuvastatin Calcium Tab 10 MG: ORAL | 90 days supply | Qty: 90 | Fill #0 | Status: AC

## 2021-04-14 MED FILL — Metformin HCl Tab 500 MG: ORAL | 90 days supply | Qty: 180 | Fill #0 | Status: AC

## 2021-04-15 ENCOUNTER — Ambulatory Visit: Payer: 59 | Admitting: Cardiology

## 2021-04-15 ENCOUNTER — Other Ambulatory Visit (HOSPITAL_COMMUNITY): Payer: Self-pay

## 2021-04-16 ENCOUNTER — Other Ambulatory Visit (HOSPITAL_COMMUNITY): Payer: Self-pay

## 2021-04-16 ENCOUNTER — Other Ambulatory Visit: Payer: Self-pay | Admitting: Cardiology

## 2021-04-16 ENCOUNTER — Telehealth: Payer: Self-pay | Admitting: Cardiology

## 2021-04-16 MED ORDER — SACUBITRIL-VALSARTAN 24-26 MG PO TABS
1.0000 | ORAL_TABLET | Freq: Two times a day (BID) | ORAL | 3 refills | Status: DC
  Filled 2021-04-16 – 2021-06-16 (×2): qty 180, 90d supply, fill #0

## 2021-04-16 NOTE — Telephone Encounter (Signed)
Duplicate encounter regarding this concern.

## 2021-04-16 NOTE — Telephone Encounter (Signed)
Patient called to say that this medication was sacubitril-valsartan (ENTRESTO) 24-26 MG he is unsure why and wanted to make sure that he is still supposed to be taking the medication. Please advise

## 2021-04-16 NOTE — Telephone Encounter (Signed)
Patient called and said that his medication ENTRESTO was denied by the pharmacy and he wanted to know why it was denied. Said that he never had to deal with this problem before.

## 2021-04-16 NOTE — Telephone Encounter (Signed)
Patient entresto prescription has been denied per chart review of refill encounters on 5/23 and 5/25  Spoke with patient - he is unsure why med is denied. Advised I will [facility]rest[facility]   Confirmed with pharmacy Rx was received - too soon to fill. Next fill due June 24  Relayed this to patient and advised he check with pharmacy and/or insurance on this

## 2021-04-21 NOTE — Progress Notes (Signed)
HPI: Follow-up congestive heart failure.  Patient was admitted January 2022 with pulmonary embolus.  Echocardiogram at that time revealed ejection fraction 30 to 35%, mild left ventricular enlargement, mild right ventricular enlargement, mild RV dysfunction, moderate pulmonary hypertension, mild left atrial enlargement and mild mitral regurgitation.  Reduced LV function felt secondary to alcohol use.  Patient was treated medically.  Follow-up echocardiogram May 2022 showed ejection fraction 30 to 35%.  Since last seen there is no dyspnea, chest pain, palpitations or syncope.  Current Outpatient Medications  Medication Sig Dispense Refill   apixaban (ELIQUIS) 5 MG TABS tablet TAKE 1 TABLET (5 MG TOTAL) BY MOUTH 2 (TWO) TIMES DAILY. 180 tablet 3   Blood Glucose Monitoring Suppl (CONTOUR NEXT ONE) KIT USE TO TEST 2 TIMES DAILY 1 kit 0   dapagliflozin propanediol (FARXIGA) 10 MG TABS tablet TAKE 1 TABLET (10 MG TOTAL) BY MOUTH DAILY. 90 tablet 3   dapagliflozin propanediol (FARXIGA) 10 MG TABS tablet TAKE 1 TABLET BY MOUTH DAILY 30 tablet 0   digoxin (LANOXIN) 0.125 MG tablet TAKE 1 TABLET (0.125 MG TOTAL) BY MOUTH DAILY. 90 tablet 3   furosemide (LASIX) 40 MG tablet Take 40 mg daily only as needed for weight gain 90 tablet 3   glucose blood test strip TEST 2 TIMES DAILY 100 strip 0   magnesium oxide (MAG-OX) 400 MG tablet TAKE 1 TABLET (400 MG TOTAL) BY MOUTH TWO TIMES DAILY. 60 tablet 0   Magnesium Oxide 400 (240 Mg) MG TABS TAKE 1 TABLET (400 MG TOTAL) BY MOUTH TWO TIMES DAILY. 60 tablet 0   metFORMIN (GLUCOPHAGE) 500 MG tablet TAKE 1 TABLET (500 MG TOTAL) BY MOUTH 2 (TWO) TIMES DAILY WITH A MEAL. 180 tablet 1   metFORMIN (GLUCOPHAGE) 500 MG tablet TAKE 1 TABLET BY MOUTH 2 TIMES A DAY 60 tablet 0   Microlet Lancets MISC TEST 2 TIMES DAILY 100 each 0   PARoxetine (PAXIL) 20 MG tablet TAKE 1 TABLET (20 MG TOTAL) BY MOUTH DAILY. 90 tablet 1   potassium chloride SA (KLOR-CON) 20 MEQ tablet TAKE  1 TABLET (20 MEQ TOTAL) BY MOUTH DAILY. 30 tablet 0   rosuvastatin (CRESTOR) 10 MG tablet TAKE 1 TABLET (10 MG TOTAL) BY MOUTH DAILY. 90 tablet 1   sacubitril-valsartan (ENTRESTO) 24-26 MG Take 1 tablet by mouth 2 (two) times daily. 180 tablet 3   spironolactone (ALDACTONE) 25 MG tablet Take 0.5 tablets (12.5 mg total) by mouth daily. 45 tablet 0   metoprolol succinate (TOPROL-XL) 100 MG 24 hr tablet Take 1 tablet (100 mg total) by mouth daily. Take with or immediately following a meal. 90 tablet 3   No current facility-administered medications for this visit.     Past Medical History:  Diagnosis Date   Alcoholic fatty liver    Alcoholism ([place])    sober since 10/2020   Anxiety    Chronic systolic CHF (congestive heart failure) (HCC)    HTN (hypertension)    OSA (obstructive sleep apnea)    on CPAP   Type 2 diabetes mellitus ([place])     History reviewed. No pertinent surgical history.  Social History   Socioeconomic History   Marital status: Divorced    Spouse name: Not on file   Number of children: Not on file   Years of education: Not on file   Highest education level: Not on file  Occupational History   Not on file  Tobacco Use   Smoking status: Former  Pack years: 0.00   Smokeless tobacco: Never  Substance and Sexual Activity   Alcohol use: Not Currently    Comment: prior alcohol abuse; sober for 1 month   Drug use: Never   Sexual activity: Not Currently  Other Topics Concern   Not on file  Social History Narrative   Not on file   Social Determinants of Health   Financial Resource Strain: Not on file  Food Insecurity: Not on file  Transportation Needs: Not on file  Physical Activity: Not on file  Stress: Not on file  Social Connections: Not on file  Intimate Partner Violence: Not on file    Family History  Problem Relation Age of Onset   Diabetes Mellitus II Maternal Grandfather    Pulmonary embolism Neg Hx    CAD Neg Hx     ROS: no fevers or  chills, productive cough, hemoptysis, dysphasia, odynophagia, melena, hematochezia, dysuria, hematuria, rash, seizure activity, orthopnea, PND, pedal edema, claudication. Remaining systems are negative.  Physical Exam: Well-developed well-nourished in no acute distress.  Skin is warm and dry.  HEENT is normal.  Neck is supple.  Chest is clear to auscultation with normal expansion.  Cardiovascular exam is regular rate and rhythm.  Abdominal exam nontender or distended. No masses palpated. Extremities show no edema. neuro grossly intact  A/P  1 cardiomyopathy-previously felt to be secondary to alcohol.  Continue Entresto, Toprol, Lasix, spironolactone, digoxin and Farxiga.  Given that LV function continues to be reduced we will arrange coronary CTA to rule out obstructive coronary disease.  I will also arrange cardiac MRI to exclude infiltrative disease.  If LV function does not improve we will need to consider ICD.  2 chronic systolic congestive heart failure-he appears to be euvolemic.  Continue diuretics at present dose.  Check potassium and renal function.  3 hypertension-patient's blood pressure is controlled.  Continue present medical regimen.  4 pulmonary embolus-continue apixaban. Previously felt to be secondary to travel from [place] to [place].  Discontinue apixaban at the end of July.  If he has additional thromboembolic events in the future will need long-term anticoagulation.  5 sleep apnea-continue CPAP.  6 history of alcohol abuse-he has discontinued.  Kirk Ruths, MD

## 2021-04-28 ENCOUNTER — Other Ambulatory Visit (HOSPITAL_COMMUNITY): Payer: Self-pay

## 2021-04-28 MED FILL — Paroxetine HCl Tab 20 MG: ORAL | 90 days supply | Qty: 90 | Fill #0 | Status: AC

## 2021-05-05 ENCOUNTER — Other Ambulatory Visit: Payer: Self-pay

## 2021-05-05 ENCOUNTER — Other Ambulatory Visit (HOSPITAL_COMMUNITY): Payer: Self-pay

## 2021-05-05 ENCOUNTER — Encounter: Payer: Self-pay | Admitting: Cardiology

## 2021-05-05 ENCOUNTER — Ambulatory Visit (INDEPENDENT_AMBULATORY_CARE_PROVIDER_SITE_OTHER): Payer: 59 | Admitting: Cardiology

## 2021-05-05 VITALS — BP 126/78 | HR 105 | Ht 72.0 in | Wt 192.0 lb

## 2021-05-05 DIAGNOSIS — I5022 Chronic systolic (congestive) heart failure: Secondary | ICD-10-CM

## 2021-05-05 DIAGNOSIS — I420 Dilated cardiomyopathy: Secondary | ICD-10-CM

## 2021-05-05 DIAGNOSIS — G4733 Obstructive sleep apnea (adult) (pediatric): Secondary | ICD-10-CM | POA: Diagnosis not present

## 2021-05-05 DIAGNOSIS — F1011 Alcohol abuse, in remission: Secondary | ICD-10-CM

## 2021-05-05 DIAGNOSIS — I10 Essential (primary) hypertension: Secondary | ICD-10-CM

## 2021-05-05 DIAGNOSIS — I2699 Other pulmonary embolism without acute cor pulmonale: Secondary | ICD-10-CM

## 2021-05-05 MED ORDER — METOPROLOL TARTRATE 100 MG PO TABS
ORAL_TABLET | ORAL | 0 refills | Status: DC
  Filled 2021-05-05 – 2021-06-16 (×2): qty 1, 1d supply, fill #0

## 2021-05-05 NOTE — Patient Instructions (Signed)
Medication Instructions:   STOP ELIQUIS THE END OF July  *If you need a refill on your cardiac medications before your next appointment, please call your pharmacy*   Lab Work:  Your physician recommends that you HAVE LAB WORK ONE WEEK PRIOR TO CTA AND MRA  If you have labs (blood work) drawn today and your tests are completely normal, you will receive your results only by: MyChart Message (if you have MyChart) OR A paper copy in the mail If you have any lab test that is abnormal or we need to change your treatment, we will call you to review the results.   Testing/Procedures:  Your physician has requested that you have a cardiac MRI. Cardiac MRI uses a computer to create images of your heart as its beating, producing both still and moving pictures of your heart and major blood vessels. For further information please visit [email or web address]. Please follow the instruction sheet given to you today for more information.   Your cardiac CT will be scheduled at   [facility] [address] [place], [place] [address] [phone]   If scheduled at [facility], please arrive at the [facility] main entrance (entrance A) of [facility] 30 minutes prior to test start time. Proceed to the [facility] Radiology Department (first floor) to check-in and test prep.  Please follow these instructions carefully (unless otherwise directed):  Hold all erectile dysfunction medications at least 3 days (72 hrs) prior to test.  On the Night Before the Test: Be sure to Drink plenty of water. Do not consume any caffeinated/decaffeinated beverages or chocolate 12 hours prior to your test. Do not take any antihistamines 12 hours prior to your test.   On the Day of the Test: Drink plenty of water until 1 hour prior to the test. Do not eat any food 4 hours prior to the test. You may take your regular medications prior to the test.  Take metoprolol (Lopressor) 100 MGtwo  hours prior to test. HOLD Furosemide/Hydrochlorothiazide morning of the test. FEMALES- please wear underwire-free bra if available   After the Test: Drink plenty of water. After receiving IV contrast, you may experience a mild flushed feeling. This is normal. On occasion, you may experience a mild rash up to 24 hours after the test. This is not dangerous. If this occurs, you can take Benadryl 25 mg and increase your fluid intake. If you experience trouble breathing, this can be serious. If it is severe call 911 IMMEDIATELY. If it is mild, please call our office. If you take any of these medications: Glipizide/Metformin, Avandament, Glucavance, please do not take 48 hours after completing test unless otherwise instructed.   Once we have confirmed authorization from your insurance company, we will call you to set up a date and time for your test. Based on how quickly your insurance processes prior authorizations requests, please allow up to 4 weeks to be contacted for scheduling your Cardiac CT appointment. Be advised that routine Cardiac CT appointments could be scheduled as many as 8 weeks after your provider has ordered it.  For non-scheduling related questions, please contact the cardiac imaging nurse navigator should you have any questions/concerns: Rockwell Alexandria, Cardiac Imaging Nurse Navigator Larey Brick, Cardiac Imaging Nurse Navigator [facility] Direct Office Dial: [phone]   For scheduling needs, including cancellations and rescheduling, please call [place], [phone].    Follow-Up: At [facility], you and your health needs are our priority.  As part of our continuing mission to provide you with exceptional heart care, we have created designated Provider Care Teams.  These Care Teams include your primary Cardiologist (physician) and Advanced Practice Providers (APPs -  Physician Assistants and Nurse Practitioners) who all work together to  provide you with the care you need, when you need it.  We recommend signing up for the patient portal called "MyChart".  Sign up information is provided on this After Visit Summary.  MyChart is used to connect with patients for Virtual Visits (Telemedicine).  Patients are able to view lab/test results, encounter notes, upcoming appointments, etc.  Non-urgent messages can be sent to your provider as well.   To learn more about what you can do with MyChart, go to [email or web address].    Your next appointment:   3 month(s)  The format for your next appointment:   In Person  Provider:   Olga Millers, MD

## 2021-05-13 ENCOUNTER — Other Ambulatory Visit (HOSPITAL_COMMUNITY): Payer: Self-pay

## 2021-05-15 ENCOUNTER — Other Ambulatory Visit (HOSPITAL_COMMUNITY): Payer: Self-pay

## 2021-05-15 MED FILL — Apixaban Tab 5 MG: ORAL | 90 days supply | Qty: 180 | Fill #0 | Status: AC

## 2021-05-15 MED FILL — Digoxin Tab 125 MCG (0.125 MG): ORAL | 90 days supply | Qty: 90 | Fill #0 | Status: AC

## 2021-05-20 ENCOUNTER — Telehealth (HOSPITAL_COMMUNITY): Payer: Self-pay | Admitting: Radiology

## 2021-05-20 NOTE — Telephone Encounter (Signed)
Attempted to call patient x2 in order to schedule Cardiac MRI.  Left message for pt to return call.

## 2021-06-16 ENCOUNTER — Other Ambulatory Visit: Payer: Self-pay | Admitting: Cardiology

## 2021-06-16 ENCOUNTER — Other Ambulatory Visit (HOSPITAL_COMMUNITY): Payer: Self-pay

## 2021-06-16 ENCOUNTER — Other Ambulatory Visit: Payer: Self-pay

## 2021-06-16 MED ORDER — METOPROLOL SUCCINATE ER 100 MG PO TB24
100.0000 mg | ORAL_TABLET | Freq: Every day | ORAL | 3 refills | Status: DC
  Filled 2021-06-16: qty 90, 90d supply, fill #0
  Filled 2021-09-19: qty 90, 90d supply, fill #1
  Filled 2021-12-21: qty 90, 90d supply, fill #2
  Filled 2022-03-09: qty 90, 90d supply, fill #3

## 2021-06-16 NOTE — Telephone Encounter (Signed)
E-sent  RX to pharmacy

## 2021-06-17 ENCOUNTER — Other Ambulatory Visit (HOSPITAL_COMMUNITY): Payer: Self-pay

## 2021-06-17 MED ORDER — SPIRONOLACTONE 25 MG PO TABS
12.5000 mg | ORAL_TABLET | Freq: Every day | ORAL | 0 refills | Status: DC
  Filled 2021-06-17: qty 45, 90d supply, fill #0

## 2021-06-20 ENCOUNTER — Telehealth (HOSPITAL_COMMUNITY): Payer: Self-pay | Admitting: Emergency Medicine

## 2021-06-20 NOTE — Telephone Encounter (Signed)
Attempted to call patient regarding upcoming cardiac MR appointment. Left message on voicemail with name and callback number Yves Fodor RN Navigator Cardiac Imaging [facility] [phone] Office [phone] Cell  

## 2021-06-23 ENCOUNTER — Ambulatory Visit (HOSPITAL_COMMUNITY): Admission: RE | Admit: 2021-06-23 | Payer: 59 | Source: Ambulatory Visit

## 2021-06-26 ENCOUNTER — Telehealth (HOSPITAL_COMMUNITY): Payer: Self-pay | Admitting: Emergency Medicine

## 2021-06-26 NOTE — Telephone Encounter (Signed)
Attempted to call patient regarding upcoming cardiac MR appointment. Left message on voicemail with name and callback number Avon Mergenthaler RN Navigator Cardiac Imaging [facility] [phone] Office [phone] Cell  

## 2021-06-27 ENCOUNTER — Ambulatory Visit (HOSPITAL_COMMUNITY)
Admission: RE | Admit: 2021-06-27 | Discharge: 2021-06-27 | Disposition: A | Discharge status: Home / Self Care | Payer: 59 | Source: Ambulatory Visit | Attending: Cardiology | Admitting: Cardiology

## 2021-06-27 ENCOUNTER — Other Ambulatory Visit: Payer: Self-pay

## 2021-06-27 DIAGNOSIS — I420 Dilated cardiomyopathy: Secondary | ICD-10-CM | POA: Insufficient documentation

## 2021-06-27 MED ORDER — GADOBUTROL 1 MMOL/ML IV SOLN
13.0000 mL | Freq: Once | INTRAVENOUS | Status: AC | PRN
  Administered 2021-06-27: 13 mL via INTRAVENOUS

## 2021-06-30 ENCOUNTER — Telehealth: Payer: Self-pay | Admitting: Cardiology

## 2021-06-30 NOTE — Telephone Encounter (Signed)
Called patient, advised of results. CTA is ordered, will reach out to brittany lynch to see if we can get this scheduled, I see a few calls were made to get it scheduled but he is aware you will be contacting him. Thanks!

## 2021-06-30 NOTE — Telephone Encounter (Signed)
   Pt is returning call to get MRI result 

## 2021-07-07 ENCOUNTER — Telehealth (HOSPITAL_COMMUNITY): Payer: Self-pay | Admitting: *Deleted

## 2021-07-07 NOTE — Telephone Encounter (Signed)
Attempted to call patient regarding upcoming cardiac CT appointment. °Left message on voicemail with name and callback number ° °Angela Platner RN Navigator Cardiac Imaging °[facility] °[phone] Office °[phone] Cell ° °

## 2021-07-08 ENCOUNTER — Other Ambulatory Visit (HOSPITAL_COMMUNITY): Payer: Self-pay

## 2021-07-08 ENCOUNTER — Telehealth (HOSPITAL_COMMUNITY): Payer: Self-pay | Admitting: *Deleted

## 2021-07-08 ENCOUNTER — Other Ambulatory Visit (HOSPITAL_COMMUNITY): Payer: Self-pay | Admitting: *Deleted

## 2021-07-08 LAB — CBC
Hematocrit: 43.7 % (ref 37.5–51.0)
Hemoglobin: 15.4 g/dL (ref 13.0–17.7)
MCH: 30.4 pg (ref 26.6–33.0)
MCHC: 35.2 g/dL (ref 31.5–35.7)
MCV: 86 fL (ref 79–97)
Platelets: 153 10*3/uL (ref 150–450)
RBC: 5.06 x10E6/uL (ref 4.14–5.80)
RDW: 13.7 % (ref 11.6–15.4)
WBC: 4.8 10*3/uL (ref 3.4–10.8)

## 2021-07-08 LAB — BASIC METABOLIC PANEL
BUN/Creatinine Ratio: 16 (ref 9–20)
BUN: 12 mg/dL (ref 6–24)
CO2: 25 mmol/L (ref 20–29)
Calcium: 9.5 mg/dL (ref 8.7–10.2)
Chloride: 98 mmol/L (ref 96–106)
Creatinine, Ser: 0.76 mg/dL (ref 0.76–1.27)
Glucose: 120 mg/dL — ABNORMAL HIGH (ref 65–99)
Potassium: 4.6 mmol/L (ref 3.5–5.2)
Sodium: 141 mmol/L (ref 134–144)
eGFR: 112 mL/min/{1.73_m2} (ref >59)

## 2021-07-08 MED ORDER — METOPROLOL TARTRATE 100 MG PO TABS
ORAL_TABLET | ORAL | 0 refills | Status: DC
Start: 2021-07-08 — End: 2021-07-24
  Filled 2021-07-08 (×2): qty 1, 1d supply, fill #0

## 2021-07-08 NOTE — Telephone Encounter (Signed)
Patient returning call regarding upcoming cardiac imaging study; pt verbalizes understanding of appt date/time, parking situation and where to check in, pre-test NPO status and medications ordered, and verified current allergies; name and call back number provided for further questions should they arise  Sohana Austell RN Navigator Cardiac Imaging [facility] [phone] office [phone] cell  Patient to take 100mg metoprolol tartrate two hours prior to cardiac CT scan. 

## 2021-07-09 ENCOUNTER — Other Ambulatory Visit: Payer: Self-pay

## 2021-07-09 ENCOUNTER — Ambulatory Visit (HOSPITAL_COMMUNITY)
Admission: RE | Admit: 2021-07-09 | Discharge: 2021-07-09 | Disposition: A | Discharge status: Home / Self Care | Payer: 59 | Source: Ambulatory Visit | Attending: Cardiology | Admitting: Cardiology

## 2021-07-09 DIAGNOSIS — I420 Dilated cardiomyopathy: Secondary | ICD-10-CM

## 2021-07-09 MED ORDER — NITROGLYCERIN 0.4 MG SL SUBL: 0.8000 mg | SUBLINGUAL_TABLET | Freq: Once | SUBLINGUAL | Status: AC

## 2021-07-09 MED ORDER — NITROGLYCERIN 0.4 MG SL SUBL
SUBLINGUAL_TABLET | SUBLINGUAL | Status: AC
  Administered 2021-07-09: 0.8 mg via SUBLINGUAL
  Filled 2021-07-09: qty 2

## 2021-07-09 MED ORDER — DILTIAZEM HCL 25 MG/5ML IV SOLN: 10.0000 mg | Freq: Once | INTRAVENOUS | Status: AC

## 2021-07-09 MED ORDER — DILTIAZEM HCL 25 MG/5ML IV SOLN
10.0000 mg | Freq: Once | INTRAVENOUS | Status: AC
  Administered 2021-07-09: 10 mg via INTRAVENOUS

## 2021-07-09 MED ORDER — METOPROLOL TARTRATE 5 MG/5ML IV SOLN
INTRAVENOUS | Status: AC
  Administered 2021-07-09: 5 mg via INTRAVENOUS
  Filled 2021-07-09: qty 10

## 2021-07-09 MED ORDER — IOHEXOL 350 MG/ML SOLN
95.0000 mL | Freq: Once | INTRAVENOUS | Status: AC | PRN
  Administered 2021-07-09: 95 mL via INTRAVENOUS

## 2021-07-09 MED ORDER — METOPROLOL TARTRATE 5 MG/5ML IV SOLN
5.0000 mg | INTRAVENOUS | Status: DC | PRN
  Administered 2021-07-09: 5 mg via INTRAVENOUS

## 2021-07-09 MED ORDER — DILTIAZEM HCL 25 MG/5ML IV SOLN
INTRAVENOUS | Status: AC
  Administered 2021-07-09: 10 mg via INTRAVENOUS
  Filled 2021-07-09: qty 5

## 2021-07-21 ENCOUNTER — Other Ambulatory Visit: Payer: Self-pay | Admitting: Internal Medicine

## 2021-07-21 ENCOUNTER — Other Ambulatory Visit (HOSPITAL_COMMUNITY): Payer: Self-pay

## 2021-07-21 DIAGNOSIS — E785 Hyperlipidemia, unspecified: Secondary | ICD-10-CM

## 2021-07-21 DIAGNOSIS — F3342 Major depressive disorder, recurrent, in full remission: Secondary | ICD-10-CM

## 2021-07-21 MED FILL — Dapagliflozin Propanediol Tab 10 MG (Base Equivalent): ORAL | 90 days supply | Qty: 90 | Fill #1 | Status: AC

## 2021-07-22 ENCOUNTER — Other Ambulatory Visit (HOSPITAL_COMMUNITY): Payer: Self-pay

## 2021-07-24 ENCOUNTER — Ambulatory Visit (INDEPENDENT_AMBULATORY_CARE_PROVIDER_SITE_OTHER): Payer: 59 | Admitting: Internal Medicine

## 2021-07-24 ENCOUNTER — Other Ambulatory Visit: Payer: Self-pay

## 2021-07-24 ENCOUNTER — Other Ambulatory Visit (HOSPITAL_COMMUNITY): Payer: Self-pay

## 2021-07-24 ENCOUNTER — Encounter: Payer: Self-pay | Admitting: Internal Medicine

## 2021-07-24 VITALS — BP 134/82 | HR 89 | Temp 97.9°F | Resp 16 | Ht 72.0 in | Wt 199.0 lb

## 2021-07-24 DIAGNOSIS — F3342 Major depressive disorder, recurrent, in full remission: Secondary | ICD-10-CM

## 2021-07-24 DIAGNOSIS — E785 Hyperlipidemia, unspecified: Secondary | ICD-10-CM | POA: Diagnosis not present

## 2021-07-24 DIAGNOSIS — Z1159 Encounter for screening for other viral diseases: Secondary | ICD-10-CM | POA: Insufficient documentation

## 2021-07-24 DIAGNOSIS — E119 Type 2 diabetes mellitus without complications: Secondary | ICD-10-CM | POA: Diagnosis not present

## 2021-07-24 LAB — MICROALBUMIN / CREATININE URINE RATIO
Creatinine,U: 82.4 mg/dL
Microalb Creat Ratio: 16.1 mg/g (ref 0.0–30.0)
Microalb, Ur: 13.3 mg/dL — ABNORMAL HIGH (ref 0.0–1.9)

## 2021-07-24 LAB — HEMOGLOBIN A1C: Hgb A1c MFr Bld: 5.8 % (ref 4.6–6.5)

## 2021-07-24 MED ORDER — ROSUVASTATIN CALCIUM 10 MG PO TABS
10.0000 mg | ORAL_TABLET | Freq: Every day | ORAL | 1 refills | Status: DC
  Filled 2021-07-24: qty 90, 90d supply, fill #0
  Filled 2021-10-29: qty 90, 90d supply, fill #1

## 2021-07-24 MED ORDER — PAROXETINE HCL 20 MG PO TABS
20.0000 mg | ORAL_TABLET | Freq: Every day | ORAL | 1 refills | Status: DC
Start: 2021-07-24 — End: 2022-01-29
  Filled 2021-07-24: qty 90, 90d supply, fill #0
  Filled 2021-10-29: qty 90, 90d supply, fill #1

## 2021-07-24 NOTE — Progress Notes (Signed)
Subjective:  Patient ID: Gregory Stevenson, male    DOB: [birth year]  Age: 45 y.o. MRN: [id number]  CC: Diabetes  This visit occurred during the SARS-CoV-2 public health emergency.  Safety protocols were in place, including screening questions prior to the visit, additional usage of staff PPE, and extensive cleaning of exam room while observing appropriate contact time as indicated for disinfecting solutions.    HPI Gregory Stevenson presents for f/up -  He tells me his blood sugars are consistently less than 120.  He denies chest pain, shortness of breath, edema, or polys.  He would like to stay on the current dose of paroxetine.  Outpatient Medications Prior to Visit  Medication Sig Dispense Refill   apixaban (ELIQUIS) 5 MG TABS tablet TAKE 1 TABLET (5 MG TOTAL) BY MOUTH 2 (TWO) TIMES DAILY. 180 tablet 3   Blood Glucose Monitoring Suppl (CONTOUR NEXT ONE) KIT USE TO TEST 2 TIMES DAILY 1 kit 0   dapagliflozin propanediol (FARXIGA) 10 MG TABS tablet TAKE 1 TABLET (10 MG TOTAL) BY MOUTH DAILY. 90 tablet 3   digoxin (LANOXIN) 0.125 MG tablet TAKE 1 TABLET (0.125 MG TOTAL) BY MOUTH DAILY. 90 tablet 3   furosemide (LASIX) 40 MG tablet Take 40 mg daily only as needed for weight gain 90 tablet 3   glucose blood test strip TEST 2 TIMES DAILY 100 strip 0   magnesium oxide (MAG-OX) 400 MG tablet TAKE 1 TABLET (400 MG TOTAL) BY MOUTH TWO TIMES DAILY. 60 tablet 0   Magnesium Oxide 400 (240 Mg) MG TABS TAKE 1 TABLET (400 MG TOTAL) BY MOUTH TWO TIMES DAILY. 60 tablet 0   metoprolol succinate (TOPROL-XL) 100 MG 24 hr tablet Take 1 tablet (100 mg total) by mouth daily. Take with or immediately following a meal. 90 tablet 3   Microlet Lancets MISC TEST 2 TIMES DAILY 100 each 0   potassium chloride SA (KLOR-CON) 20 MEQ tablet TAKE 1 TABLET (20 MEQ TOTAL) BY MOUTH DAILY. 30 tablet 0   sacubitril-valsartan (ENTRESTO) 24-26 MG Take 1 tablet by mouth 2 (two) times daily. 180 tablet 3   spironolactone (ALDACTONE) 25 MG  tablet Take 0.5 tablets (12.5 mg total) by mouth daily. 45 tablet 0   dapagliflozin propanediol (FARXIGA) 10 MG TABS tablet TAKE 1 TABLET BY MOUTH DAILY 30 tablet 0   metFORMIN (GLUCOPHAGE) 500 MG tablet TAKE 1 TABLET (500 MG TOTAL) BY MOUTH 2 (TWO) TIMES DAILY WITH A MEAL. 180 tablet 1   metFORMIN (GLUCOPHAGE) 500 MG tablet TAKE 1 TABLET BY MOUTH 2 TIMES A DAY 60 tablet 0   metoprolol tartrate (LOPRESSOR) 100 MG tablet TAKE 2 HOURS PRIOR TO CT SCAN 1 tablet 0   PARoxetine (PAXIL) 20 MG tablet TAKE 1 TABLET (20 MG TOTAL) BY MOUTH DAILY. 90 tablet 1   rosuvastatin (CRESTOR) 10 MG tablet TAKE 1 TABLET (10 MG TOTAL) BY MOUTH DAILY. 90 tablet 1   No facility-administered medications prior to visit.    ROS Review of Systems  Constitutional:  Negative for diaphoresis and fatigue.  HENT: Negative.    Eyes: Negative.   Respiratory:  Negative for cough, chest tightness, shortness of breath and wheezing.   Cardiovascular:  Negative for chest pain, palpitations and leg swelling.  Gastrointestinal:  Negative for abdominal pain, constipation, diarrhea and vomiting.  Endocrine: Negative.  Negative for polydipsia, polyphagia and polyuria.  Genitourinary: Negative.  Negative for difficulty urinating.  Musculoskeletal: Negative.   Skin: Negative.  Negative for color change.  Neurological:  Negative for dizziness, weakness and light-headedness.  Hematological:  Negative for adenopathy. Does not bruise/bleed easily.  Psychiatric/Behavioral: Negative.     Objective:  BP 134/82 (BP Location: Left Arm, Patient Position: Sitting, Cuff Size: Large)   Pulse 89   Temp 97.9 F (36.6 C) (Oral)   Resp 16   Ht 6' (1.829 m)   Wt 199 lb (90.3 kg)   SpO2 98%   BMI 26.99 kg/m   BP Readings from Last 3 Encounters:  07/24/21 134/82  07/09/21 111/75  05/05/21 126/78    Wt Readings from Last 3 Encounters:  07/24/21 199 lb (90.3 kg)  05/05/21 192 lb (87.1 kg)  01/16/21 210 lb (95.3 kg)    Physical  Exam Vitals reviewed.  HENT:     Nose: Nose normal.     Mouth/Throat:     Mouth: Mucous membranes are moist.  Eyes:     Conjunctiva/sclera: Conjunctivae normal.  Cardiovascular:     Rate and Rhythm: Normal rate and regular rhythm.     Heart sounds: No murmur heard. Pulmonary:     Effort: Pulmonary effort is normal.     Breath sounds: No stridor. No wheezing, rhonchi or rales.  Abdominal:     General: Abdomen is flat. Bowel sounds are normal. There is no distension.     Palpations: Abdomen is soft. There is no hepatomegaly, splenomegaly or mass.  Musculoskeletal:        General: Normal range of motion.     Cervical back: Neck supple.     Right lower leg: No edema.     Left lower leg: No edema.  Lymphadenopathy:     Cervical: No cervical adenopathy.  Skin:    General: Skin is warm and dry.  Neurological:     General: No focal deficit present.     Mental Status: He is alert.    Lab Results  Component Value Date   WBC 4.8 07/07/2021   HGB 15.4 07/07/2021   HCT 43.7 07/07/2021   PLT 153 07/07/2021   GLUCOSE 120 (H) 07/07/2021   CHOL 159 12/09/2020   TRIG 109 12/09/2020   HDL 26 (L) 12/09/2020   LDLCALC 111 (H) 12/09/2020   ALT 19 12/10/2020   AST 21 12/10/2020   NA 141 07/07/2021   K 4.6 07/07/2021   CL 98 07/07/2021   CREATININE 0.76 07/07/2021   BUN 12 07/07/2021   CO2 25 07/07/2021   TSH 1.733 12/07/2020   INR 1.3 (H) 12/06/2020   HGBA1C 5.8 07/24/2021   MICROALBUR 13.3 (H) 07/24/2021    CT CORONARY MORPH W/CTA COR W/SCORE W/CA W/CM &/OR WO/CM  Addendum Date: 07/09/2021   ADDENDUM REPORT: 07/09/2021 15:59 CLINICAL DATA:  Cardiomyopathy EXAM: Cardiac CTA MEDICATIONS: Sub lingual nitro. 4 mg and lopressor 142m TECHNIQUE: The patient was scanned on a [id number] scanner. Gantry rotation speed was 250 msecs. Collimation was. 6 mm . A 120 kV prospective scan was triggered in the ascending thoracic aorta at 140 HU's with full mA between 30-70% of the R-R  interval . Average HR during the scan was 67 bpm. The 3D data set was interpreted on a dedicated work station using MPR, MIP and VRT modes. A total of 80 cc of contrast was used. FINDINGS: Non-cardiac: [facility] No significant findings on limited lung and soft tissue windows. Calcium score: No calcium noted Coronary Arteries: Right dominant with no anomalies LM: Normal LAD: Normal D1: Normal D2: Normal D3: Normal Circumflex:  Normal OM1: Normal OM2: Normal RCA: Normal PDA: Normal PLA: Normal IMPRESSION: 1. Calcium score 0 2.  Normal right dominant coronary arteries CAD RADS 0 3.  Normal aortic root 3.2 cm 4. Dilated main pulmonary artery suggesting elevated PA pressures 3.2 cm Jenkins Rouge Electronically Signed   By: Jenkins Rouge M.D.   On: 07/09/2021 15:59   Result Date: 07/09/2021 EXAM: OVER-READ INTERPRETATION  CT CHEST The following report is an over-read performed by radiologist Dr. Abigail Miyamoto of [facility], [place] on 07/09/2021. This over-read does not include interpretation of cardiac or coronary anatomy or pathology. The coronary CTA interpretation by the cardiologist is attached. COMPARISON:  12/08/2020 chest radiograph.  CTA chest 12/06/2020 FINDINGS: Vascular: Normal aortic caliber. Pulmonary arteries not well opacified secondary to bolus timing. Mediastinum/Nodes: Adenopathy within the azygoesophageal recess measures 1.7 cm on 11/11 and is improved compared to 2.1 cm on 12/06/2020. Lungs/Pleura: No pleural fluid.  Clear imaged lungs. Upper Abdomen: Incompletely imaged cirrhosis. Normal imaged portions of the spleen. Musculoskeletal: Left greater than right gynecomastia. IMPRESSION: 1.  No acute findings in the imaged extracardiac chest. 2. Improved mediastinal adenopathy, favored to be reactive. 3. Cirrhosis.  Resolved perihepatic ascites since 12/06/2020. 4. Bilateral gynecomastia. Electronically Signed: By: Abigail Miyamoto M.D. On: 07/09/2021 15:53     Assessment & Plan:   Gregory Stevenson was seen today for diabetes.  Diagnoses and all orders for this visit:  Type 2 diabetes mellitus without complication, without long-term current use of insulin ([place])- His A1c is down to 5.8%.  I recommended that he stop taking metformin and to continue the SGLT2 inhibitor. -     Hemoglobin A1c; Future -     Microalbumin / creatinine urine ratio; Future -     Microalbumin / creatinine urine ratio -     Hemoglobin A1c  Recurrent major depressive disorder, in full remission (HCC) -     PARoxetine (PAXIL) 20 MG tablet; Take 1 tablet (20 mg total) by mouth daily.  Hyperlipidemia LDL goal <70 -     rosuvastatin (CRESTOR) 10 MG tablet; Take 1 tablet (10 mg total) by mouth daily.  Need for hepatitis C screening test -     Hepatitis C antibody; Future -     Hepatitis C antibody  I have discontinued Gregory Hoban "Smitty"'s metFORMIN and metoprolol tartrate. I am also having him maintain his furosemide, magnesium oxide, apixaban, digoxin, dapagliflozin propanediol, magnesium oxide, potassium chloride SA, Contour Next One, Microlet Lancets, glucose blood, sacubitril-valsartan, spironolactone, metoprolol succinate, PARoxetine, and rosuvastatin.  Meds ordered this encounter  Medications   PARoxetine (PAXIL) 20 MG tablet    Sig: Take 1 tablet (20 mg total) by mouth daily.    Dispense:  90 tablet    Refill:  1   rosuvastatin (CRESTOR) 10 MG tablet    Sig: Take 1 tablet (10 mg total) by mouth daily.    Dispense:  90 tablet    Refill:  1     Follow-up: Return in about 6 months (around 01/21/2022).  Scarlette Calico, MD

## 2021-07-24 NOTE — Patient Instructions (Signed)
Type 2 Diabetes Mellitus, Diagnosis, Adult ?Type 2 diabetes (type 2 diabetes mellitus) is a long-term, or chronic, disease. In type 2 diabetes, one or both of these problems may be present: ?The pancreas does not make enough of a hormone called insulin. ?Cells in the body do not respond properly to the insulin that the body makes (insulin resistance). ?Normally, insulin allows blood sugar (glucose) to enter cells in the body. The cells use glucose for energy. Insulin resistance or lack of insulin causes excess glucose to build up in the blood instead of going into cells. This causes high blood glucose (hyperglycemia).  ?What are the causes? ?The exact cause of type 2 diabetes is not known. ?What increases the risk? ?The following factors may make you more likely to develop this condition: ?Having a family member with type 2 diabetes. ?Being overweight or obese. ?Being inactive (sedentary). ?Having been diagnosed with insulin resistance. ?Having a history of prediabetes, diabetes when you were pregnant (gestational diabetes), or polycystic ovary syndrome (PCOS). ?What are the signs or symptoms? ?In the early stage of this condition, you may not have symptoms. Symptoms develop slowly and may include: ?Increased thirst or hunger. ?Increased urination. ?Unexplained weight loss. ?Tiredness (fatigue) or weakness. ?Vision changes, such as blurry vision. ?Dark patches on the skin. ?How is this diagnosed? ?This condition is diagnosed based on your symptoms, your medical history, a physical exam, and your blood glucose level. Your blood glucose may be checked with one or more of the following blood tests: ?A fasting blood glucose (FBG) test. You will not be allowed to eat (you will fast) for 8 hours or longer before a blood sample is taken. ?A random blood glucose test. This test checks blood glucose at any time of day regardless of when you ate. ?An A1C (hemoglobin A1C) blood test. This test provides information about blood  glucose levels over the previous 2-3 months. ?An oral glucose tolerance test (OGTT). This test measures your blood glucose at two times: ?After fasting. This is your baseline blood glucose level. ?Two hours after drinking a beverage that contains glucose. ?You may be diagnosed with type 2 diabetes if: ?Your fasting blood glucose level is 126 mg/dL (7.0 mmol/L) or higher. ?Your random blood glucose level is 200 mg/dL (11.1 mmol/L) or higher. ?Your A1C level is 6.5% or higher. ?Your oral glucose tolerance test result is higher than 200 mg/dL (11.1 mmol/L). ?These blood tests may be repeated to confirm your diagnosis. ?How is this treated? ?Your treatment may be managed by a specialist called an endocrinologist. Type 2 diabetes may be treated by following instructions from your health care provider about: ?Making dietary and lifestyle changes. These may include: ?Following a personalized nutrition plan that is developed by a registered dietitian. ?Exercising regularly. ?Finding ways to manage stress. ?Checking your blood glucose level as often as told. ?Taking diabetes medicines or insulin daily. This helps to keep your blood glucose levels in the healthy range. ?Taking medicines to help prevent complications from diabetes. Medicines may include: ?Aspirin. ?Medicine to lower cholesterol. ?Medicine to control blood pressure. ?Your health care provider will set treatment goals for you. Your goals will be based on your age, other medical conditions you have, and how you respond to diabetes treatment. Generally, the goal of treatment is to maintain the following blood glucose levels: ?Before meals: 80-130 mg/dL (4.4-7.2 mmol/L). ?After meals: below 180 mg/dL (10 mmol/L). ?A1C level: less than 7%. ?Follow these instructions at home: ?Questions to ask your health care provider ?  Consider asking the following questions: ?Should I meet with a certified diabetes care and education specialist? ?What diabetes medicines do I need,  and when should I take them? ?What equipment will I need to manage my diabetes at home? ?How often do I need to check my blood glucose? ?Where can I find a support group for people with diabetes? ?What number can I call if I have questions? ?When is my next appointment? ?General instructions ?Take over-the-counter and prescription medicines only as told by your health care provider. ?Keep all follow-up visits. This is important. ?Where to find more information ?For help and guidance and for more information about diabetes, please visit: ?American Diabetes Association (ADA): www.diabetes.org ?American Association of Diabetes Care and Education Specialists (ADCES): www.diabeteseducator.org ?International Diabetes Federation (IDF): www.idf.org ?Contact a health care provider if: ?Your blood glucose is at or above 240 mg/dL (13.3 mmol/L) for 2 days in a row. ?You have been sick or have had a fever for 2 days or longer, and you are not getting better. ?You have any of the following problems for more than 6 hours: ?You cannot eat or drink. ?You have nausea and vomiting. ?You have diarrhea. ?Get help right away if: ?You have severe hypoglycemia. This means your blood glucose is lower than 54 mg/dL (3.0 mmol/L). ?You become confused or you have trouble thinking clearly. ?You have difficulty breathing. ?You have moderate or large ketone levels in your urine. ?These symptoms may represent a serious problem that is an emergency. Do not wait to see if the symptoms will go away. Get medical help right away. Call your local emergency services (911 in the [place] Do not drive yourself to the hospital. ?Summary ?Type 2 diabetes mellitus is a long-term, or chronic, disease. In type 2 diabetes, the pancreas does not make enough of a hormone called insulin, or cells in the body do not respond properly to insulin that the body makes. ?This condition is treated by making dietary and lifestyle changes and taking diabetes medicines or  insulin. ?Your health care provider will set treatment goals for you. Your goals will be based on your age, other medical conditions you have, and how you respond to diabetes treatment. ?Keep all follow-up visits. This is important. ?This information is not intended to replace advice given to you by your health care provider. Make sure you discuss any questions you have with your health care provider. ?Document Revised: 02/03/2021 Document Reviewed: 02/03/2021 ?Elsevier Patient Education ? 2022 Elsevier Inc. ? ?

## 2021-07-25 LAB — HEPATITIS C ANTIBODY
Hepatitis C Ab: NONREACTIVE
SIGNAL TO CUT-OFF: 0.01 (ref <1.00)

## 2021-07-30 ENCOUNTER — Other Ambulatory Visit (HOSPITAL_COMMUNITY): Payer: Self-pay

## 2021-08-20 ENCOUNTER — Other Ambulatory Visit (HOSPITAL_COMMUNITY): Payer: Self-pay

## 2021-08-20 MED FILL — Digoxin Tab 125 MCG (0.125 MG): ORAL | 90 days supply | Qty: 90 | Fill #1 | Status: AC

## 2021-09-01 ENCOUNTER — Other Ambulatory Visit: Payer: Self-pay | Admitting: Cardiology

## 2021-09-01 MED FILL — Apixaban Tab 5 MG: ORAL | 90 days supply | Qty: 180 | Fill #1 | Status: AC

## 2021-09-02 ENCOUNTER — Other Ambulatory Visit (HOSPITAL_COMMUNITY): Payer: Self-pay

## 2021-09-03 ENCOUNTER — Other Ambulatory Visit (HOSPITAL_COMMUNITY): Payer: Self-pay

## 2021-09-03 ENCOUNTER — Other Ambulatory Visit: Payer: Self-pay | Admitting: Cardiology

## 2021-09-03 MED ORDER — SPIRONOLACTONE 25 MG PO TABS
12.5000 mg | ORAL_TABLET | Freq: Every day | ORAL | 3 refills | Status: DC
  Filled 2021-09-03: qty 45, 90d supply, fill #0
  Filled 2021-12-18: qty 45, 90d supply, fill #1
  Filled 2022-03-09: qty 45, 90d supply, fill #2
  Filled 2022-06-17: qty 45, 90d supply, fill #3

## 2021-09-05 NOTE — Progress Notes (Signed)
FYB:[id number]-CH congestive heart failure.  Patient was admitted January 2022 with pulmonary embolus.  Echocardiogram at that time revealed ejection fraction 30 to 35%, mild left ventricular enlargement, mild right ventricular enlargement, mild RV dysfunction, moderate pulmonary hypertension, mild left atrial enlargement and mild mitral regurgitation.  Reduced LV function felt secondary to alcohol use.  Patient was treated medically.  Follow-up echocardiogram May 2022 showed ejection fraction 30 to 35%.  CTA August 2022 showed calcium score 0, normal coronary arteries and mildly dilated pulmonary artery.  Cardiac MRI August 2022 showed gadolinium pattern suggestive of sarcoid and PET scan recommended;  Ejection fraction 20% with severe left ventricular dilatation and mild RV dysfunction.  Since last seen there is no dyspnea, chest pain, palpitations or syncope.  Current Outpatient Medications  Medication Sig Dispense Refill   apixaban (ELIQUIS) 5 MG TABS tablet TAKE 1 TABLET (5 MG TOTAL) BY MOUTH 2 (TWO) TIMES DAILY. 180 tablet 3   Blood Glucose Monitoring Suppl (CONTOUR NEXT ONE) KIT USE TO TEST 2 TIMES DAILY 1 kit 0   dapagliflozin propanediol (FARXIGA) 10 MG TABS tablet TAKE 1 TABLET (10 MG TOTAL) BY MOUTH DAILY. 90 tablet 3   digoxin (LANOXIN) 0.125 MG tablet TAKE 1 TABLET (0.125 MG TOTAL) BY MOUTH DAILY. 90 tablet 3   furosemide (LASIX) 40 MG tablet Take 40 mg daily only as needed for weight gain 90 tablet 3   glucose blood test strip TEST 2 TIMES DAILY 100 strip 0   magnesium oxide (MAG-OX) 400 MG tablet TAKE 1 TABLET (400 MG TOTAL) BY MOUTH TWO TIMES DAILY. 60 tablet 0   Magnesium Oxide 400 (240 Mg) MG TABS TAKE 1 TABLET (400 MG TOTAL) BY MOUTH TWO TIMES DAILY. 60 tablet 0   metoprolol succinate (TOPROL-XL) 100 MG 24 hr tablet Take 1 tablet (100 mg total) by mouth daily. Take with or immediately following a meal. 90 tablet 3   Microlet Lancets MISC TEST 2 TIMES DAILY 100 each 0    PARoxetine (PAXIL) 20 MG tablet Take 1 tablet (20 mg total) by mouth daily. 90 tablet 1   potassium chloride SA (KLOR-CON) 20 MEQ tablet TAKE 1 TABLET (20 MEQ TOTAL) BY MOUTH DAILY. 30 tablet 0   rosuvastatin (CRESTOR) 10 MG tablet Take 1 tablet (10 mg total) by mouth daily. 90 tablet 1   sacubitril-valsartan (ENTRESTO) 24-26 MG Take 1 tablet by mouth 2 (two) times daily. 180 tablet 3   spironolactone (ALDACTONE) 25 MG tablet Take 0.5 tablets (12.5 mg total) by mouth daily. 45 tablet 3   No current facility-administered medications for this visit.     Past Medical History:  Diagnosis Date   Alcoholic fatty liver    Alcoholism ([place])    sober since 10/2020   Anxiety    Chronic systolic CHF (congestive heart failure) (HCC)    HTN (hypertension)    OSA (obstructive sleep apnea)    on CPAP   Type 2 diabetes mellitus ([place])     History reviewed. No pertinent surgical history.  Social History   Socioeconomic History   Marital status: Divorced    Spouse name: Not on file   Number of children: Not on file   Years of education: Not on file   Highest education level: Not on file  Occupational History   Not on file  Tobacco Use   Smoking status: Former   Smokeless tobacco: Never  Substance and Sexual Activity   Alcohol use: Not Currently  Comment: prior alcohol abuse; sober for 1 month   Drug use: Never   Sexual activity: Not Currently  Other Topics Concern   Not on file  Social History Narrative   Not on file   Social Determinants of Health   Financial Resource Strain: Not on file  Food Insecurity: Not on file  Transportation Needs: Not on file  Physical Activity: Not on file  Stress: Not on file  Social Connections: Not on file  Intimate Partner Violence: Not on file    Family History  Problem Relation Age of Onset   Diabetes Mellitus II Maternal Grandfather    Pulmonary embolism Neg Hx    CAD Neg Hx     ROS: no fevers or chills, productive cough, hemoptysis,  dysphasia, odynophagia, melena, hematochezia, dysuria, hematuria, rash, seizure activity, orthopnea, PND, pedal edema, claudication. Remaining systems are negative.  Physical Exam: Well-developed well-nourished in no acute distress.  Skin is warm and dry.  HEENT is normal.  Neck is supple.  Chest is clear to auscultation with normal expansion.  Cardiovascular exam is regular rate and rhythm.  Abdominal exam nontender or distended. No masses palpated. Extremities show no edema. neuro grossly intact  A/P  1 nonischemic cardiomyopathy-previously felt possibly secondary to alcohol.  Continue Entresto, Toprol, Lasix, spironolactone, digoxin and Farxiga.  Previous CTA showed no coronary disease.  MRI suggested possible sarcoid.  We will arrange PET scan to further assess.  If negative will need follow-up with EP for consideration of ICD.  2 chronic systolic congestive heart failure-he is euvolemic.  Continue diuretics at present dose.   3 hypertension-blood pressure controlled.  Continue present medical regimen.  4 history of pulmonary embolus- This was felt previously secondary to travel from [place] to [place].  However he is also at higher risk given severe LV dysfunction.  We will continue apixaban for now but will consider discontinuing in the future.  5 history of alcohol abuse-I asked pt to avoid.  6 sleep apnea-continue CPAP.  Kirk Ruths, MD

## 2021-09-12 ENCOUNTER — Encounter: Payer: Self-pay | Admitting: Cardiology

## 2021-09-12 ENCOUNTER — Other Ambulatory Visit: Payer: Self-pay

## 2021-09-12 ENCOUNTER — Ambulatory Visit (INDEPENDENT_AMBULATORY_CARE_PROVIDER_SITE_OTHER): Payer: 59 | Admitting: Cardiology

## 2021-09-12 VITALS — BP 112/70 | HR 66 | Ht 72.0 in | Wt 214.0 lb

## 2021-09-12 DIAGNOSIS — I10 Essential (primary) hypertension: Secondary | ICD-10-CM

## 2021-09-12 DIAGNOSIS — I420 Dilated cardiomyopathy: Secondary | ICD-10-CM

## 2021-09-12 DIAGNOSIS — I5022 Chronic systolic (congestive) heart failure: Secondary | ICD-10-CM | POA: Diagnosis not present

## 2021-09-12 NOTE — Patient Instructions (Signed)
Medication Instructions:  Your Physician recommend you continue on your current medication as directed.    *If you need a refill on your cardiac medications before your next appointment, please call your pharmacy*   Testing/Procedures: PET scan a [facility]  Follow-Up: At [facility], you and your health needs are our priority.  As part of our continuing mission to provide you with exceptional heart care, we have created designated Provider Care Teams.  These Care Teams include your primary Cardiologist (physician) and Advanced Practice Providers (APPs -  Physician Assistants and Nurse Practitioners) who all work together to provide you with the care you need, when you need it.  We recommend signing up for the patient portal called "MyChart".  Sign up information is provided on this After Visit Summary.  MyChart is used to connect with patients for Virtual Visits (Telemedicine).  Patients are able to view lab/test results, encounter notes, upcoming appointments, etc.  Non-urgent messages can be sent to your provider as well.   To learn more about what you can do with MyChart, go to [email or web address].    Your next appointment:   6 month(s)  The format for your next appointment:   In Person  Provider:   Olga Millers, MD

## 2021-09-19 ENCOUNTER — Other Ambulatory Visit (HOSPITAL_COMMUNITY): Payer: Self-pay

## 2021-09-19 ENCOUNTER — Telehealth: Payer: Self-pay | Admitting: Cardiology

## 2021-09-19 NOTE — Telephone Encounter (Signed)
Patient was calling in to see if the PET scan was schedule, and if not what do he needs to do. Please advise

## 2021-09-19 NOTE — Telephone Encounter (Signed)
Spoke to patient advised I will send message to Dr.Crenshaw's RN.She will call you back Monday.

## 2021-09-22 NOTE — Telephone Encounter (Signed)
Left message for pt, his PET scan is scheduled for 10/24/21 @11 :45 am at [facility].

## 2021-10-20 ENCOUNTER — Other Ambulatory Visit (HOSPITAL_COMMUNITY): Payer: Self-pay

## 2021-10-20 MED FILL — Dapagliflozin Propanediol Tab 10 MG (Base Equivalent): ORAL | 90 days supply | Qty: 90 | Fill #2 | Status: AC

## 2021-10-29 ENCOUNTER — Other Ambulatory Visit (HOSPITAL_COMMUNITY): Payer: Self-pay

## 2021-10-29 NOTE — Telephone Encounter (Signed)
Spoke with patient and advised I am not sure if MD has received report from [facility] to be reviewed. Advised will send Crenshaw MD a message for follow up  Report viewable in Care Everywhere

## 2021-10-29 NOTE — Telephone Encounter (Signed)
Patient is following up. He would like to know if his PET scan results have been received.

## 2021-10-29 NOTE — Telephone Encounter (Signed)
PET scan suggests cardiac sarcoid; I have spoken to Dr Gala Romney and he well see in CHF clinic to initiate therapy.  Gregory Stevenson   Patient aware of results and that they will call him with an appointment.

## 2021-10-31 NOTE — Telephone Encounter (Signed)
Pt is returning call regarding results 

## 2021-10-31 NOTE — Telephone Encounter (Signed)
Returned call to pt he states that he "spoke with someone 2 days ago and they told him that Dr Jens Som was going to give him a call" and he states that he has not received a call. Informed pt that Dr Bensimhon's office would be giving him a cal. Pt states that "so, this is not important then?" Told pt that yes, it is important and that someone would be calling to schedule.

## 2021-11-03 ENCOUNTER — Encounter: Payer: Self-pay | Admitting: Cardiology

## 2021-11-10 ENCOUNTER — Other Ambulatory Visit (HOSPITAL_COMMUNITY): Payer: Self-pay

## 2021-11-10 MED FILL — Digoxin Tab 125 MCG (0.125 MG): ORAL | 90 days supply | Qty: 90 | Fill #2 | Status: AC

## 2021-12-11 ENCOUNTER — Other Ambulatory Visit (HOSPITAL_COMMUNITY): Payer: Self-pay

## 2021-12-11 ENCOUNTER — Ambulatory Visit (HOSPITAL_COMMUNITY)
Admission: RE | Admit: 2021-12-11 | Discharge: 2021-12-11 | Disposition: A | Discharge status: Home / Self Care | Payer: 59 | Source: Ambulatory Visit | Attending: Internal Medicine | Admitting: Internal Medicine

## 2021-12-11 ENCOUNTER — Other Ambulatory Visit (HOSPITAL_COMMUNITY): Payer: Self-pay | Admitting: Internal Medicine

## 2021-12-11 ENCOUNTER — Other Ambulatory Visit: Payer: Self-pay

## 2021-12-11 VITALS — BP 145/80 | HR 95 | Wt 227.6 lb

## 2021-12-11 DIAGNOSIS — I2699 Other pulmonary embolism without acute cor pulmonale: Secondary | ICD-10-CM

## 2021-12-11 DIAGNOSIS — I4510 Unspecified right bundle-branch block: Secondary | ICD-10-CM | POA: Insufficient documentation

## 2021-12-11 DIAGNOSIS — I5022 Chronic systolic (congestive) heart failure: Secondary | ICD-10-CM | POA: Insufficient documentation

## 2021-12-11 DIAGNOSIS — E119 Type 2 diabetes mellitus without complications: Secondary | ICD-10-CM | POA: Diagnosis not present

## 2021-12-11 DIAGNOSIS — R002 Palpitations: Secondary | ICD-10-CM

## 2021-12-11 DIAGNOSIS — D8689 Sarcoidosis of other sites: Secondary | ICD-10-CM | POA: Insufficient documentation

## 2021-12-11 DIAGNOSIS — Z87891 Personal history of nicotine dependence: Secondary | ICD-10-CM | POA: Insufficient documentation

## 2021-12-11 DIAGNOSIS — G4733 Obstructive sleep apnea (adult) (pediatric): Secondary | ICD-10-CM | POA: Diagnosis not present

## 2021-12-11 DIAGNOSIS — F1011 Alcohol abuse, in remission: Secondary | ICD-10-CM | POA: Diagnosis not present

## 2021-12-11 DIAGNOSIS — D8685 Sarcoid myocarditis: Secondary | ICD-10-CM | POA: Diagnosis not present

## 2021-12-11 DIAGNOSIS — I110 Hypertensive heart disease with heart failure: Secondary | ICD-10-CM | POA: Insufficient documentation

## 2021-12-11 DIAGNOSIS — Z86711 Personal history of pulmonary embolism: Secondary | ICD-10-CM | POA: Diagnosis not present

## 2021-12-11 DIAGNOSIS — Z7984 Long term (current) use of oral hypoglycemic drugs: Secondary | ICD-10-CM | POA: Diagnosis not present

## 2021-12-11 DIAGNOSIS — Z79899 Other long term (current) drug therapy: Secondary | ICD-10-CM | POA: Diagnosis not present

## 2021-12-11 MED ORDER — FOLIC ACID 1 MG PO TABS
1.0000 mg | ORAL_TABLET | Freq: Every day | ORAL | 3 refills | Status: DC
  Filled 2021-12-11: qty 30, 30d supply, fill #0
  Filled 2022-01-04: qty 30, 30d supply, fill #1
  Filled 2022-02-15: qty 30, 30d supply, fill #2
  Filled 2022-03-09 – 2022-03-24 (×2): qty 30, 30d supply, fill #3

## 2021-12-11 MED ORDER — CALCIUM CITRATE 333 MG PO TABS: 3.0000 | ORAL_TABLET | Freq: Every day | ORAL | Status: DC

## 2021-12-11 MED ORDER — VITAMIN D (CHOLECALCIFEROL) 25 MCG (1000 UT) PO CAPS: 1.0000 | ORAL_CAPSULE | Freq: Every day | ORAL | Status: DC

## 2021-12-11 MED ORDER — METHOTREXATE 2.5 MG PO TABS
ORAL_TABLET | ORAL | 0 refills | Status: DC
  Filled 2021-12-11: qty 18, 28d supply, fill #0

## 2021-12-11 MED ORDER — PREDNISONE 20 MG PO TABS
30.0000 mg | ORAL_TABLET | Freq: Every day | ORAL | 0 refills | Status: DC
  Filled 2021-12-11: qty 45, 30d supply, fill #0

## 2021-12-11 MED ORDER — SULFAMETHOXAZOLE-TRIMETHOPRIM 800-160 MG PO TABS
1.0000 | ORAL_TABLET | ORAL | 3 refills | Status: DC
  Filled 2021-12-11: qty 12, 28d supply, fill #0
  Filled 2022-01-04: qty 12, 28d supply, fill #1
  Filled 2022-02-01: qty 12, 28d supply, fill #2
  Filled 2022-03-09: qty 12, 28d supply, fill #3
  Filled 2022-05-03: qty 12, 28d supply, fill #4

## 2021-12-11 MED ORDER — OMEPRAZOLE 20 MG PO CPDR: 20.0000 mg | DELAYED_RELEASE_CAPSULE | Freq: Every day | ORAL | 11 refills | Status: DC

## 2021-12-11 NOTE — Progress Notes (Signed)
ADVANCED HF CLINIC CONSULT NOTE  Referring Physician: Dr. Stanford Breed Primary Care: Janith Lima, MD   HPI:  Gregory Stevenson is a 46 y/o male with h/o ETOH (quit 2021), DM2, OSA, HTN referred by Dr. 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   He was admitted 1/22 with pulmonary embolus.  Echocardiogram at that time revealed ejection fraction 30 to 35%, mild left ventricular enlargement, mild right ventricular enlargement, mild RV dysfunction, moderate pulmonary hypertension, mild left atrial enlargement and mild mitral regurgitation. Patient was treated medically. LV dysfunction felt possibly related to ETOH use,   Follow-up echocardiogram May 2022 showed ejection fraction 30 to 35%.  CTA August 2022 showed calcium score 0, normal coronary arteries and mildly dilated pulmonary artery.  Cardiac MRI August 2022 showed LVEF 20% RVEF 43% gadolinium pattern suggestive of sarcoid and PET scan recommended;  Ejection fraction 20% with severe left ventricular dilatation and mild RV dysfunction.   PET SCAN 10/24/21   1. Extensive active myocardial inflammation involving nearly the entire left ventricle. Inflammatory findings are most severe in the anterior and inferior walls, and spare the basal anterolateral/ inferolateral and apical inferior segments. The metabolic and perfusion findings above are compatible with cardiac sarcoidosis and cardiomyopathy in the appropriate clinical setting.   2. FDG-avid thoracic and upper abdominal lymphadenopathy, hypermetabolic foci in the liver and spleen, and several FDG avid   lesions in a mid- thoracic vertebral body. Taken together these findings most likely represent extensive extra-cardiac   sarcoidosis, although correlation with any prior contrast-enhanced cross-sectional imaging is suggested. If prior imaging is   unavailable and if clinically warranted, further imaging could be obtained including a liver MRI with contrast and  whole body   bone scan.   Says he is feeling pretty good but has been anxious over his diagnosis and what he has been reading about it. Denies CP or SOB with usual activities. Gets winded with steps or hills. No palpitations, syncope or presyncope. Compliant with med.    Review of Systems: [y] = yes, [ ]  = no   General: Weight gain [ ] ; Weight loss [ ] ; Anorexia [ ] ; Fatigue [ ] ; Fever [ ] ; Chills [ ] ; Weakness [ ]   Cardiac: Chest pain/pressure [ ] ; Resting SOB [ ] ; Exertional SOB [ y]; Orthopnea [ ] ; Pedal Edema [ ] ; Palpitations [ ] ; Syncope [ ] ; Presyncope [ ] ; Paroxysmal nocturnal dyspnea[ ]   Pulmonary: Cough [ ] ; Wheezing[ ] ; Hemoptysis[ ] ; Sputum [ ] ; Snoring [ ]   GI: Vomiting[ ] ; Dysphagia[ ] ; Melena[ ] ; Hematochezia [ ] ; Heartburn[ ] ; Abdominal pain [ ] ; Constipation [ ] ; Diarrhea [ ] ; BRBPR [ ]   GU: Hematuria[ ] ; Dysuria [ ] ; Nocturia[ ]   Vascular: Pain in legs with walking [ ] ; Pain in feet with lying flat [ ] ; Non-healing sores [ ] ; Stroke [ ] ; TIA [ ] ; Slurred speech [ ] ;  Neuro: Headaches[ ] ; Vertigo[ ] ; Seizures[ ] ; Paresthesias[ ] ;Blurred vision [ ] ; Diplopia [ ] ; Vision changes [ ]   Ortho/Skin: Arthritis [ ] ; Joint pain [ ] ; Muscle pain [ ] ; Joint swelling [ ] ; Back Pain [ ] ; Rash [ ]   Psych: Depression[ ] ; Anxiety[ ]   Heme: Bleeding problems [ ] ; Clotting disorders [ ] ; Anemia [ ]   Endocrine: Diabetes [ ] ; Thyroid dysfunction[ ]    Past Medical History:  Diagnosis Date   Alcoholic fatty liver    Alcoholism ([place])    sober since 10/2020   Anxiety  Chronic systolic CHF (congestive heart failure) (HCC)    HTN (hypertension)    OSA (obstructive sleep apnea)    on CPAP   Type 2 diabetes mellitus (HCC)     Current Outpatient Medications  Medication Sig Dispense Refill   apixaban (ELIQUIS) 5 MG TABS tablet TAKE 1 TABLET (5 MG TOTAL) BY MOUTH 2 (TWO) TIMES DAILY. 180 tablet 3   Blood Glucose Monitoring Suppl (CONTOUR NEXT ONE) KIT USE TO TEST 2 TIMES DAILY 1 kit 0    dapagliflozin propanediol (FARXIGA) 10 MG TABS tablet TAKE 1 TABLET (10 MG TOTAL) BY MOUTH DAILY. 90 tablet 3   digoxin (LANOXIN) 0.125 MG tablet TAKE 1 TABLET (0.125 MG TOTAL) BY MOUTH DAILY. 90 tablet 3   furosemide (LASIX) 40 MG tablet Take 40 mg daily only as needed for weight gain 90 tablet 3   glucose blood test strip TEST 2 TIMES DAILY 100 strip 0   magnesium oxide (MAG-OX) 400 MG tablet TAKE 1 TABLET (400 MG TOTAL) BY MOUTH TWO TIMES DAILY. 60 tablet 0   metoprolol succinate (TOPROL-XL) 100 MG 24 hr tablet Take 1 tablet (100 mg total) by mouth daily. Take with or immediately following a meal. 90 tablet 3   Microlet Lancets MISC TEST 2 TIMES DAILY 100 each 0   PARoxetine (PAXIL) 20 MG tablet Take 1 tablet (20 mg total) by mouth daily. 90 tablet 1   potassium chloride SA (KLOR-CON) 20 MEQ tablet TAKE 1 TABLET (20 MEQ TOTAL) BY MOUTH DAILY. 30 tablet 0   rosuvastatin (CRESTOR) 10 MG tablet Take 1 tablet (10 mg total) by mouth daily. 90 tablet 1   sacubitril-valsartan (ENTRESTO) 24-26 MG Take 1 tablet by mouth 2 (two) times daily. 180 tablet 3   spironolactone (ALDACTONE) 25 MG tablet Take 0.5 tablets (12.5 mg total) by mouth daily. 45 tablet 3   No current facility-administered medications for this encounter.    No Known Allergies    Social History   Socioeconomic History   Marital status: Divorced    Spouse name: Not on file   Number of children: Not on file   Years of education: Not on file   Highest education level: Not on file  Occupational History   Not on file  Tobacco Use   Smoking status: Former   Smokeless tobacco: Never  Substance and Sexual Activity   Alcohol use: Not Currently    Comment: prior alcohol abuse; sober for 1 month   Drug use: Never   Sexual activity: Not Currently  Other Topics Concern   Not on file  Social History Narrative   Not on file   Social Determinants of Health   Financial Resource Strain: Not on file  Food Insecurity: Not on file   Transportation Needs: Not on file  Physical Activity: Not on file  Stress: Not on file  Social Connections: Not on file  Intimate Partner Violence: Not on file      Family History  Problem Relation Age of Onset   Diabetes Mellitus II Maternal Grandfather    Pulmonary embolism Neg Hx    CAD Neg Hx     Vitals:   12/11/21 1437  BP: (!) 145/80  Pulse: 95  SpO2: 95%  Weight: 103.2 kg (227 lb 9.6 oz)    PHYSICAL EXAM: General:  Well appearing. No respiratory difficulty HEENT: normal Neck: supple. no JVD. Carotids 2+ bilat; no bruits. No lymphadenopathy or thryomegaly appreciated. Cor: PMI nondisplaced. Regular rate & rhythm. No rubs, gallops  or murmurs. Lungs: clear Abdomen: soft, nontender, nondistended. No hepatosplenomegaly. No bruits or masses. Good bowel sounds. Extremities: no cyanosis, clubbing, rash, edema Neuro: alert & oriented x 3, cranial nerves grossly intact. moves all 4 extremities w/o difficulty. Affect pleasant.  ECG: NSR 90 +RBBB Personally reviewed  ASSESSMENT & PLAN:   1. 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 Has RBBB on ECG - Discussed pathophysiology and treatment with steroids/MTX - Begin prednisone 30 daily and MTX 10 weekly - Start PPI, folic acid, PJP prophylaxis - Refer to PharmD for titration visits - Repeat PET in 3-4 months  3. DM2 - watch sugars with steroids  4. ETOH abuse - quit 12/21  5. PE - continue apixaban - can switch to 2.5 BID after 6  months based on Amplify-EXT trial  Glori Bickers, MD  10:20 PM

## 2021-12-11 NOTE — Progress Notes (Signed)
Serial number [id number]  Zio patch placed onto patient.  All instructions and information reviewed with patient, they verbalize understanding with no questions.

## 2021-12-11 NOTE — Patient Instructions (Signed)
Start Sarcoid Protocol:   Prednisone 30 mg (1 & 1/2 tabs) Daily   Methotrexate 10 mg (4 tabs) once a week for 2 weeks, then increase to Methotrexate 12.5 mg (5 tabs) once a week   Bactrim DS 1 tab every Monday, Wednesday, and Friday   Folic Acid 1 mg Daily   Omeprazole 20 mg Daily, can get over the counter   Calcium Daily, need between 1200-1500 mg Daily, over the counter   Vitamin D Daily, need between 806 275 2178 IU (25 mcg) Daily, over the counter   Your provider has recommended that  you wear a Zio Patch for 14 days.  This monitor will record your heart rhythm for our review.  IF you have any symptoms while wearing the monitor please press the button.  If you have any issues with the patch or you notice a red or orange light on it please call the company at [phone].  Once you remove the patch please mail it back to the company as soon as possible so we can get the results.  Please follow up with our heart failure pharmacist in 1 week  Your physician recommends that you schedule a follow-up appointment in: 1 month with Dr Gala Romney  If you have any questions or concerns before your next appointment please send [place] a message through [place] or call our office at [phone].    TO LEAVE A MESSAGE FOR THE NURSE SELECT OPTION 2, PLEASE LEAVE A MESSAGE INCLUDING: YOUR NAME DATE OF BIRTH CALL BACK NUMBER REASON FOR CALL**this is important as we prioritize the call backs  YOU WILL RECEIVE A CALL BACK THE SAME DAY AS LONG AS YOU CALL BEFORE 4:00 PM  At the [facility], you and your health needs are our priority. As part of our continuing mission to provide you with exceptional heart care, we have created designated Provider Care Teams. These Care Teams include your primary Cardiologist (physician) and Advanced Practice Providers (APPs- Physician Assistants and Nurse Practitioners) who all work together to provide you with the care you need, when you need it.   You  may see any of the following providers on your designated Care Team at your next follow up: Dr Arvilla Meres Dr Carron Curie, NP Robbie Lis, [place] [facility] [place], [place] Karle Plumber, PharmD   Please be sure to bring in all your medications bottles to every appointment.

## 2021-12-16 NOTE — Progress Notes (Incomplete)
***In Progress***    Advanced Heart Failure Clinic Note   Referring Physician: Dr. Stanford Breed Primary Care: Janith Lima, MD  HPI:   Gregory Stevenson is a 46 y/o male with h/o ETOH (quit 2021), DM2, OSA, HTN referred by Dr. 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    He was admitted 11/2020 with pulmonary embolus.  Echocardiogram at that time revealed ejection fraction 30 to 35%, mild left ventricular enlargement, mild right ventricular enlargement, mild RV dysfunction, moderate pulmonary hypertension, mild left atrial enlargement and mild mitral regurgitation. Patient was treated medically. LV dysfunction felt possibly related to ETOH use,   Follow-up echocardiogram May 2022 showed ejection fraction 30 to 35%.  CTA August 2022 showed calcium score 0, normal coronary arteries and mildly dilated pulmonary artery.  Cardiac MRI August 2022 showed LVEF 20% RVEF 43% gadolinium pattern suggestive of sarcoid and PET scan recommended;  Ejection fraction 20% with severe left ventricular dilatation and mild RV dysfunction.   PET SCAN 10/24/21  1. Extensive active myocardial inflammation involving nearly the entire left ventricle. Inflammatory findings are most severe in the anterior and inferior walls, and spare the basal anterolateral/ inferolateral and apical inferior segments. The metabolic and perfusion findings above are compatible with cardiac sarcoidosis and cardiomyopathy in the appropriate clinical setting.   2. FDG-avid thoracic and upper abdominal lymphadenopathy, hypermetabolic foci in the liver and spleen, and several FDG avid   lesions in a mid- thoracic vertebral body. Taken together these findings most likely represent extensive extra-cardiac   sarcoidosis, although correlation with any prior contrast-enhanced cross-sectional imaging is suggested. If prior imaging is   unavailable and if clinically warranted, further imaging could be obtained including a  liver MRI with contrast and whole body   bone scan.    At last visit on 12/11/21, patient reported feeling pretty good but had been anxious over his diagnosis and what he had been reading about it. Denied CP or SOB with usual activities. Reported getting winded with steps or hills. No palpitations, syncope or presyncope. Compliant with medications.    Today he returns to HF clinic for pharmacist medication titration. At last visit with MD, ***.   Overall feeling ***. Dizziness, lightheadedness, fatigue:  Chest pain or palpitations:  How is your breathing?: *** SOB: Able to complete all ADLs. Activity level ***  Weight at home pounds. Takes furosemide/torsemide/bumex *** mg *** daily.  LEE PND/Orthopnea  Appetite *** Low-salt diet:   Physical Exam Cost/affordability of meds   HF Medications: Metoprolol succinate 100 mg daily Entresto 24-26 mg BID Spironolactone 12.5 mg daily Farxiga 10 mg daily Digoxin 0.125 mg daily Lasix 40 mg prn Potassium chloride 20 mEq daily  Cardiac Sarcoidosis Medications: Start date 12/11/21  Prednisone 30 mg daily Methotrexate 10 mg once weekly Bactrim 1 DS tablet MWF Folic acid 1 mg daily Calcium citrate 999 mg daily Vitamin D 1000 IU daily Omeprazole 20 mg daily  Has the patient been experiencing any side effects to the medications prescribed?  {YES NO:22349}  Does the patient have any problems obtaining medications due to transportation or finances?   {YES NO:22349}  Understanding of regimen: {excellent/good/fair/poor:19665} Understanding of indications: {excellent/good/fair/poor:19665} Potential of compliance: {excellent/good/fair/poor:19665} Patient understands to avoid NSAIDs. Patient understands to avoid decongestants.    Pertinent Lab Values: Serum creatinine ***, BUN ***, Potassium ***, Sodium ***, BNP ***, Magnesium ***, Digoxin ***   Vital Signs: Weight: *** (last clinic weight: 227.6 lb) Blood pressure: ***  Heart rate:  ***  Assessment/Plan: 1. 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  - Continue metoprolol succinate 100 mg daily - Continue Entresto 24/26 mg BID - Continue spironolactone 12.5 mg daily - Continue Farxiga 10 mg daily - Continue digoxin 0.125 mg daily - Labs today - See below for treatment of cardiac sarcoid   2. Cardiac sarcoidosis - cMRI 8/22 EF 20% RVEF 43%  LGW c/w sarcoid - PET 12/22 c/w sarcoid - Rhythm currently stable. Has RBBB on ECG - Discussed pathophysiology and treatment with steroids/MTX - Begin prednisone 30 mg daily and MTX 10 mg weekly - Start PPI, folic acid, PJP prophylaxis - Refer to PharmD for titration visits - Repeat PET in 3-4 months   3. DM2 - watch sugars with steroids   4. ETOH abuse - quit 12/21   5. PE - continue apixaban - can switch to 2.5 BID after 6 months based on Amplify-EXT trial  Follow up ***   Audry Riles, PharmD, BCPS, BCCP, CPP Heart Failure Clinic Pharmacist [phone]

## 2021-12-17 ENCOUNTER — Telehealth (HOSPITAL_COMMUNITY): Payer: Self-pay | Admitting: Student-PharmD

## 2021-12-17 ENCOUNTER — Other Ambulatory Visit (HOSPITAL_COMMUNITY): Payer: Self-pay

## 2021-12-17 ENCOUNTER — Other Ambulatory Visit: Payer: Self-pay

## 2021-12-17 ENCOUNTER — Telehealth (HOSPITAL_COMMUNITY): Payer: Self-pay | Admitting: Pharmacist

## 2021-12-17 ENCOUNTER — Ambulatory Visit (HOSPITAL_COMMUNITY)
Admission: RE | Admit: 2021-12-17 | Discharge: 2021-12-17 | Disposition: A | Discharge status: Home / Self Care | Payer: 59 | Source: Ambulatory Visit | Attending: Internal Medicine | Admitting: Internal Medicine

## 2021-12-17 VITALS — BP 144/84 | HR 90 | Wt 226.2 lb

## 2021-12-17 DIAGNOSIS — Z86711 Personal history of pulmonary embolism: Secondary | ICD-10-CM | POA: Insufficient documentation

## 2021-12-17 DIAGNOSIS — D8689 Sarcoidosis of other sites: Secondary | ICD-10-CM | POA: Diagnosis not present

## 2021-12-17 DIAGNOSIS — Z7984 Long term (current) use of oral hypoglycemic drugs: Secondary | ICD-10-CM | POA: Insufficient documentation

## 2021-12-17 DIAGNOSIS — I5022 Chronic systolic (congestive) heart failure: Secondary | ICD-10-CM | POA: Insufficient documentation

## 2021-12-17 DIAGNOSIS — E119 Type 2 diabetes mellitus without complications: Secondary | ICD-10-CM | POA: Insufficient documentation

## 2021-12-17 DIAGNOSIS — Z7901 Long term (current) use of anticoagulants: Secondary | ICD-10-CM | POA: Insufficient documentation

## 2021-12-17 DIAGNOSIS — Z9989 Dependence on other enabling machines and devices: Secondary | ICD-10-CM | POA: Diagnosis not present

## 2021-12-17 DIAGNOSIS — F1011 Alcohol abuse, in remission: Secondary | ICD-10-CM | POA: Insufficient documentation

## 2021-12-17 DIAGNOSIS — Z79899 Other long term (current) drug therapy: Secondary | ICD-10-CM | POA: Insufficient documentation

## 2021-12-17 DIAGNOSIS — I272 Pulmonary hypertension, unspecified: Secondary | ICD-10-CM | POA: Diagnosis not present

## 2021-12-17 DIAGNOSIS — R590 Localized enlarged lymph nodes: Secondary | ICD-10-CM | POA: Diagnosis not present

## 2021-12-17 DIAGNOSIS — G4733 Obstructive sleep apnea (adult) (pediatric): Secondary | ICD-10-CM | POA: Diagnosis not present

## 2021-12-17 DIAGNOSIS — I110 Hypertensive heart disease with heart failure: Secondary | ICD-10-CM | POA: Insufficient documentation

## 2021-12-17 DIAGNOSIS — I340 Nonrheumatic mitral (valve) insufficiency: Secondary | ICD-10-CM | POA: Insufficient documentation

## 2021-12-17 DIAGNOSIS — I4510 Unspecified right bundle-branch block: Secondary | ICD-10-CM | POA: Insufficient documentation

## 2021-12-17 LAB — COMPREHENSIVE METABOLIC PANEL
ALT: 54 U/L — ABNORMAL HIGH (ref 0–44)
AST: 47 U/L — ABNORMAL HIGH (ref 15–41)
Albumin: 4.2 g/dL (ref 3.5–5.0)
Alkaline Phosphatase: 88 U/L (ref 38–126)
Anion gap: 11 (ref 5–15)
BUN: 13 mg/dL (ref 6–20)
CO2: 27 mmol/L (ref 22–32)
Calcium: 9.6 mg/dL (ref 8.9–10.3)
Chloride: 99 mmol/L (ref 98–111)
Creatinine, Ser: 0.94 mg/dL (ref 0.61–1.24)
GFR, Estimated: >60 mL/min (ref >60)
Glucose, Bld: 168 mg/dL — ABNORMAL HIGH (ref 70–99)
Potassium: 4.9 mmol/L (ref 3.5–5.1)
Sodium: 137 mmol/L (ref 135–145)
Total Bilirubin: 0.6 mg/dL (ref 0.3–1.2)
Total Protein: 7.6 g/dL (ref 6.5–8.1)

## 2021-12-17 MED ORDER — METHOTREXATE 2.5 MG PO TABS
ORAL_TABLET | ORAL | 1 refills | Status: DC
  Filled 2021-12-17: qty 35, fill #0
  Filled 2022-01-04: qty 18, 28d supply, fill #0
  Filled 2022-01-29: qty 20, 28d supply, fill #1
  Filled 2022-02-15: qty 20, 20d supply, fill #2
  Filled 2022-02-18: qty 20, 28d supply, fill #2
  Filled 2022-03-09: qty 20, fill #3

## 2021-12-17 MED ORDER — POTASSIUM CHLORIDE CRYS ER 20 MEQ PO TBCR
20.0000 meq | EXTENDED_RELEASE_TABLET | Freq: Every day | ORAL | 3 refills | Status: DC | PRN
  Filled 2021-12-17: qty 30, 30d supply, fill #0

## 2021-12-17 MED ORDER — ENTRESTO 49-51 MG PO TABS
1.0000 | ORAL_TABLET | Freq: Two times a day (BID) | ORAL | 11 refills | Status: DC
  Filled 2021-12-17: qty 60, 30d supply, fill #0
  Filled 2022-02-01: qty 60, 30d supply, fill #1
  Filled 2022-03-09: qty 60, 30d supply, fill #2
  Filled 2022-04-24: qty 60, 30d supply, fill #3
  Filled 2022-06-03: qty 60, 30d supply, fill #4
  Filled 2022-07-12: qty 60, 30d supply, fill #5
  Filled 2022-08-10: qty 60, 30d supply, fill #6
  Filled 2022-09-16: qty 60, 30d supply, fill #7
  Filled 2022-10-19: qty 60, 30d supply, fill #8

## 2021-12-17 MED ORDER — PREDNISONE 10 MG PO TABS
30.0000 mg | ORAL_TABLET | Freq: Every day | ORAL | 1 refills | Status: DC
  Filled 2021-12-17: qty 84, 28d supply, fill #0
  Filled 2022-02-01: qty 84, 28d supply, fill #1

## 2021-12-17 NOTE — Patient Instructions (Signed)
It was a pleasure seeing you today!  MEDICATIONS: -We are changing your medications today -Increase Entresto to 49-51 mg (1 tablet) twice daily. You can take two of your current tablets until you run out.  -Call if you have questions about your medications.  LABS: -We will call you if your labs need attention.  NEXT APPOINTMENT: Return to clinic on 01/13/22 with Dr. Gala Romney.  In general, to take care of your heart failure: -Limit your fluid intake to 2 Liters (half-gallon) per day.   -Limit your salt intake to ideally 2-3 grams (2000-3000 mg) per day. -Weigh yourself daily and record, and bring that "weight diary" to your next appointment.  (Weight gain of 2-3 pounds in 1 day typically means fluid weight.) -The medications for your heart are to help your heart and help you live longer.   -Please contact us before stopping any of your heart medications.  Call the clinic at [phone] with questions or to reschedule future appointments.    Cardiac Sarcoidosis Treatment Protocol Start Date 12/15/21  Prednisone  Weeks 1-4 12/17/21 through 01/13/22 30 mg (one and a half 20 mg tablets) daily  Weeks 5-8 01/14/22 through 02/10/22 25 mg (two and a half 10 mg tablets) daily  Weeks 9-12 02/11/22 through 03/10/22 20 mg (two 10 mg tablets) daily  Weeks 13-16 03/11/22 through 04/07/22 15 mg (one and a half 10 mg tablets) daily  Weeks 17-18 04/08/22 through 04/21/22 10 mg (two 5 mg tablets) daily Stop taking Bactrim on 04/03/22  Weeks 19-20 04/22/22 through 05/05/22 7.5 mg (one and a half 5 mg tablets) daily  Weeks 21-21 05/06/22 through 05/19/22 5 mg (one 5 mg tablet) daily  Weeks 23-24 05/20/22 through 06/02/22 2.5 mg (half of a 5 mg tablet) daily  Week 25 06/03/22 Stop prednisone   Methotrexate  Weeks 1-2 12/17/21 & 12/24/21 10 mg (four 2.5 mg tablets) once weekly  Weeks 3-4 12/31/21 & 01/07/22 12.5 mg (five 2.5 mg tablets) once weekly  Weeks 5-6 01/14/22 & 01/21/22 15 mg (six 2.5 mg tablets) once weekly  Weeks  7-8 01/28/22 & 02/04/22 17.5 mg (seven 2.5 mg tablets) once weekly  Weeks 9+ 02/11/22 & on 20 mg (eight 2.5 mg tablets) once weekly

## 2021-12-17 NOTE — Telephone Encounter (Signed)
Labs returned on 12/17/21. Potassium was at higher end of normal range. Increasing Entresto so will recheck BMET in 2 weeks. Called patient to schedule appt - he will come in for labs on 12/31/21.

## 2021-12-17 NOTE — Telephone Encounter (Signed)
Patient is starting on methotrexate and prednisone for cardiac sarcoidosis. Titration protocol listed below. Patient was provided with this individualized protocol.   Cardiac Sarcoidosis Treatment Protocol Start Date 12/15/21  Prednisone  Weeks 1-4 12/17/21 through 01/13/22 30 mg (one and a half 20 mg tablets) daily  Weeks 5-8 01/14/22 through 02/10/22 25 mg (two and a half 10 mg tablets) daily  Weeks 9-12 02/11/22 through 03/10/22 20 mg (two 10 mg tablets) daily  Weeks 13-16 03/11/22 through 04/07/22 15 mg (one and a half 10 mg tablets) daily  Weeks 17-18 04/08/22 through 04/21/22 10 mg (two 5 mg tablets) daily Stop taking Bactrim on 04/03/22  Weeks 19-20 04/22/22 through 05/05/22 7.5 mg (one and a half 5 mg tablets) daily  Weeks 21-21 05/06/22 through 05/19/22 5 mg (one 5 mg tablet) daily  Weeks 23-24 05/20/22 through 06/02/22 2.5 mg (half of a 5 mg tablet) daily  Week 25 06/03/22 Stop prednisone   Methotrexate  Weeks 1-2 12/17/21 & 12/24/21 10 mg (four 2.5 mg tablets) once weekly  Weeks 3-4 12/31/21 & 01/07/22 12.5 mg (five 2.5 mg tablets) once weekly  Weeks 5-6 01/14/22 & 01/21/22 15 mg (six 2.5 mg tablets) once weekly  Weeks 7-8 01/28/22 & 02/04/22 17.5 mg (seven 2.5 mg tablets) once weekly  Weeks 9+ 02/11/22 & on 20 mg (eight 2.5 mg tablets) once weekly    Karle Plumber, PharmD, BCPS, BCCP, CPP Heart Failure Clinic Pharmacist [phone]

## 2021-12-17 NOTE — Progress Notes (Signed)
Advanced Heart Failure Clinic Note   Referring Physician: Dr. Jens Som Primary Care: Etta Grandchild, MD  HPI:   Gregory Stevenson is a 46 y/o male with h/o ETOH (quit 2021), DM2, OSA, HTN referred by Dr. 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   He was admitted 11/2020 with pulmonary embolus. Echocardiogram at that time revealed ejection fraction 30 to 35%, mild left ventricular enlargement, mild right ventricular enlargement, mild RV dysfunction, moderate pulmonary hypertension, mild left atrial enlargement and mild mitral regurgitation. Patient was treated medically. LV dysfunction felt possibly related to ETOH use. Follow-up echocardiogram May 2022 showed ejection fraction 30 to 35%.  CTA August 2022 showed calcium score 0, normal coronary arteries and mildly dilated pulmonary artery.  Cardiac MRI August 2022 showed LVEF 20% RVEF 43% gadolinium pattern suggestive of sarcoid and PET scan recommended;  Ejection fraction 20% with severe left ventricular dilatation and mild RV dysfunction.   PET SCAN 10/24/21  1. Extensive active myocardial inflammation involving nearly the entire left ventricle. Inflammatory findings are most severe in the anterior and inferior walls, and spare the basal anterolateral/ inferolateral and apical inferior segments. The metabolic and perfusion findings above are compatible with cardiac sarcoidosis and cardiomyopathy in the appropriate clinical setting.   2. FDG-avid thoracic and upper abdominal lymphadenopathy, hypermetabolic foci in the liver and spleen, and several FDG avid   lesions in a mid- thoracic vertebral body. Taken together these findings most likely represent extensive extra-cardiac sarcoidosis, although correlation with any prior contrast-enhanced cross-sectional imaging is suggested. If prior imaging is unavailable and if clinically warranted, further imaging could be obtained including a liver MRI with contrast and whole  body bone scan.    At last visit on 12/11/21, patient reported feeling pretty good but had been anxious over his diagnosis and what he had been reading about it. Denied CP or SOB with usual activities. Reported getting winded with steps or hills. No palpitations, syncope or presyncope. Compliant with medications.    Today he returns to HF clinic for pharmacist medication titration. At last visit with MD, cardiac sarcoidosis protocol was initiated. He reports he starting taking these medications today, so will consider 12/17/21 as the start of treatment. Overall he is feeling good, denies dizziness, lightheadedness, fatigue, SOB, chest pain, palpitations. Uses CPAP at night for OSA. Sleeps with 1-2 pillows at night. Weight has been stable at home. Denies LEE. Has not needed Lasix in months. Checking BP at home - mid 130s/80s. Appetite is good, endorses low salt diet.   HF Medications: Metoprolol succinate 100 mg daily Entresto 24-26 mg BID Spironolactone 12.5 mg daily Farxiga 10 mg daily Digoxin 0.125 mg daily Lasix 40 mg prn  Potassium chloride 20 mEq PRN on days taking Lasix  Cardiac Sarcoidosis Medications: Start date 12/17/21. See telephone note from 12/17/21 for full dosing protocol.   Prednisone 30 mg daily Methotrexate 10 mg once weekly (every Wednesday) Bactrim 1 DS tablet MWF Folic acid 1 mg daily Calcium carbonate 1200 mg daily Vitamin D 800 IU daily Omeprazole 20 mg daily  Has the patient been experiencing any side effects to the medications prescribed?  no  Does the patient have any problems obtaining medications due to transportation or finances?   No - he is not having any issues affording his medications. Has [employer] - eligible to use copay cards.    Understanding of regimen: good Understanding of indications: good Potential of compliance: good Patient understands to avoid NSAIDs.  Patient understands to avoid decongestants.   Pertinent Lab Values: CMET and CBC  pending  Vital Signs: Weight: 226.2 lbs (last clinic weight: 227.6 lbs) Blood pressure: 144/84  Heart rate: 90   Assessment/Plan: 1. 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 Continue Lasix 40 mg daily prn.  - Continue metoprolol succinate 100 mg daily - Increase Entresto to 49/51 mg mg BID. Repeat BMET in 2 weeks.  - Continue spironolactone 12.5 mg daily - Continue Farxiga 10 mg daily - Continue digoxin 0.125 mg daily - See below for treatment of cardiac sarcoid   2. Cardiac sarcoidosis - cMRI 06/2021 EF 20% RVEF 43%  LGW c/w sarcoid - PET 10/2021 c/w sarcoid - Rhythm currently stable. Had RBBB on 12/11/21 ECG - Treatment protocol start date: 12/17/21 - Baseline labs drawn today. Will need 1 month labs (CBC, CMET) at next visit.  - Start prednisone 30 mg daily and methotrexate 10 mg weekly. Provided patient with dosing chart and educated on proper titration - see telephone note from 12/17/21 for full titration schedule  - Continue PPI, folic acid, PJP prophylaxis with Bactrim, calcium and vitamin D supplementation - Repeat PET in 3-4 months   3. DM2 - watch sugars with steroids   4. ETOH abuse - quit 12/21   5. PE - continue apixaban - can switch to 2.5 BID after 6 months based on Amplify-EXT trial  Follow up 01/13/22 with Dr. Gala Romney.   Karle Plumber, PharmD, BCPS, BCCP, CPP Heart Failure Clinic Pharmacist [phone]

## 2021-12-18 ENCOUNTER — Other Ambulatory Visit (HOSPITAL_COMMUNITY): Payer: Self-pay

## 2021-12-22 ENCOUNTER — Other Ambulatory Visit (HOSPITAL_COMMUNITY): Payer: Self-pay

## 2021-12-31 ENCOUNTER — Ambulatory Visit (HOSPITAL_COMMUNITY)
Admission: RE | Admit: 2021-12-31 | Discharge: 2021-12-31 | Disposition: A | Discharge status: Home / Self Care | Payer: 59 | Source: Ambulatory Visit | Attending: Internal Medicine | Admitting: Internal Medicine

## 2021-12-31 ENCOUNTER — Other Ambulatory Visit: Payer: Self-pay

## 2021-12-31 DIAGNOSIS — I5022 Chronic systolic (congestive) heart failure: Secondary | ICD-10-CM | POA: Insufficient documentation

## 2021-12-31 LAB — BASIC METABOLIC PANEL
Anion gap: 12 (ref 5–15)
BUN: 22 mg/dL — ABNORMAL HIGH (ref 6–20)
CO2: 31 mmol/L (ref 22–32)
Calcium: 9.5 mg/dL (ref 8.9–10.3)
Chloride: 98 mmol/L (ref 98–111)
Creatinine, Ser: 0.87 mg/dL (ref 0.61–1.24)
GFR, Estimated: >60 mL/min (ref >60)
Glucose, Bld: 156 mg/dL — ABNORMAL HIGH (ref 70–99)
Potassium: 4.7 mmol/L (ref 3.5–5.1)
Sodium: 141 mmol/L (ref 135–145)

## 2022-01-04 MED FILL — Apixaban Tab 5 MG: ORAL | 90 days supply | Qty: 180 | Fill #2 | Status: AC

## 2022-01-05 ENCOUNTER — Other Ambulatory Visit (HOSPITAL_COMMUNITY): Payer: Self-pay

## 2022-01-05 NOTE — Addendum Note (Signed)
Encounter addended by: Micki Riley, RN on: 01/05/2022 12:48 PM  Actions taken: Imaging Exam ended

## 2022-01-08 ENCOUNTER — Other Ambulatory Visit (HOSPITAL_COMMUNITY): Payer: Self-pay

## 2022-01-13 ENCOUNTER — Other Ambulatory Visit: Payer: Self-pay

## 2022-01-13 ENCOUNTER — Ambulatory Visit (HOSPITAL_COMMUNITY)
Admission: RE | Admit: 2022-01-13 | Discharge: 2022-01-13 | Disposition: A | Discharge status: Home / Self Care | Payer: 59 | Source: Ambulatory Visit | Attending: Internal Medicine | Admitting: Internal Medicine

## 2022-01-13 ENCOUNTER — Encounter (HOSPITAL_COMMUNITY): Payer: Self-pay | Admitting: Internal Medicine

## 2022-01-13 VITALS — BP 140/88 | HR 92 | Wt 226.2 lb

## 2022-01-13 DIAGNOSIS — I5022 Chronic systolic (congestive) heart failure: Secondary | ICD-10-CM | POA: Diagnosis not present

## 2022-01-13 DIAGNOSIS — G4733 Obstructive sleep apnea (adult) (pediatric): Secondary | ICD-10-CM | POA: Diagnosis not present

## 2022-01-13 DIAGNOSIS — F1011 Alcohol abuse, in remission: Secondary | ICD-10-CM

## 2022-01-13 DIAGNOSIS — D8685 Sarcoid myocarditis: Secondary | ICD-10-CM | POA: Diagnosis not present

## 2022-01-13 LAB — BASIC METABOLIC PANEL
Anion gap: 14 (ref 5–15)
BUN: 21 mg/dL — ABNORMAL HIGH (ref 6–20)
CO2: 28 mmol/L (ref 22–32)
Calcium: 9.5 mg/dL (ref 8.9–10.3)
Chloride: 95 mmol/L — ABNORMAL LOW (ref 98–111)
Creatinine, Ser: 0.95 mg/dL (ref 0.61–1.24)
GFR, Estimated: >60 mL/min (ref >60)
Glucose, Bld: 187 mg/dL — ABNORMAL HIGH (ref 70–99)
Potassium: 4.1 mmol/L (ref 3.5–5.1)
Sodium: 137 mmol/L (ref 135–145)

## 2022-01-13 LAB — MAGNESIUM: Magnesium: 2.3 mg/dL (ref 1.7–2.4)

## 2022-01-13 NOTE — Patient Instructions (Signed)
Medication Changes:  No changes to medications  Lab Work:  Labs done today, your results will be available in MyChart, we will contact you for abnormal readings.   Testing/Procedures:  none  Referrals:  You have been referred to cardiac Electrophysiologist for discussion of an ICD   Special Instructions // Education:  none  Follow-Up in: 2 months  At the [facility], you and your health needs are our priority. We have a designated team specialized in the treatment of Heart Failure. This Care Team includes your primary Heart Failure Specialized Cardiologist (physician), Advanced Practice Providers (APPs- Physician Assistants and Nurse Practitioners), and Pharmacist who all work together to provide you with the care you need, when you need it.   You may see any of the following providers on your designated Care Team at your next follow up:  Dr Arvilla Meres Dr Carron Curie, NP Robbie Lis, [place] [facility] [place], [place] Karle Plumber, PharmD   Please be sure to bring in all your medications bottles to every appointment.   Need to Contact us:  If you have any questions or concerns before your next appointment please send [place] a message through [place] or call our office at [phone].    TO LEAVE A MESSAGE FOR THE NURSE SELECT OPTION 2, PLEASE LEAVE A MESSAGE INCLUDING: YOUR NAME DATE OF BIRTH CALL BACK NUMBER REASON FOR CALL**this is important as we prioritize the call backs  YOU WILL RECEIVE A CALL BACK THE SAME DAY AS LONG AS YOU CALL BEFORE 4:00 PM

## 2022-01-13 NOTE — Progress Notes (Addendum)
ADVANCED HF CLINIC NOTE  Referring Physician: Dr. Stanford Breed Primary Care: Janith Lima, MD   HPI:  Gregory Stevenson is a 46 y/o male with h/o ETOH (quit 2021), DM2, OSA, HTN referred by Dr. 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   He was admitted 1/22 with pulmonary embolus.  Echocardiogram at that time revealed ejection fraction 30 to 35%, mild left ventricular enlargement, mild right ventricular enlargement, mild RV dysfunction, moderate pulmonary hypertension, mild left atrial enlargement and mild mitral regurgitation. Patient was treated medically. LV dysfunction felt possibly related to ETOH use.  Echocardiogram May 2022: EF 30 to 35%.  CTA August 2022 showed calcium score 0, normal coronary arteries and mildly dilated pulmonary artery.    Cardiac MRI August 2022: LVEF 20%, RVEF 43%, LGE pattern suggestive of sarcoid   PET SCAN 10/24/21  1. Extensive active myocardial inflammation involving nearly the entire left ventricle. Inflammatory findings are most severe in the anterior and inferior walls, and spare the basal anterolateral/ inferolateral and apical inferior segments. The metabolic and perfusion findings above are compatible with cardiac sarcoidosis and cardiomyopathy in the appropriate clinical setting.   2. FDG-avid thoracic and upper abdominal lymphadenopathy, hypermetabolic foci in the liver and spleen, and several FDG avid lesions in a mid- thoracic vertebral body. Taken together these findings most likely represent extensive extra-cardiac sarcoidosis, although correlation with any prior contrast-enhanced cross-sectional imaging is suggested. If prior imaging is unavailable and if clinically warranted, further imaging could be obtained including a liver MRI with contrast and whole body bone scan.   Last seen for f/u 12/11/21. Started on prednisone + MTX for sarcoid. Volume appeared stable.  14 day Zio 01/19-02/01/23: Sinus rhythm with 25  runs NSVT up to 13 beats in duration, 1.6% PVCs, 5 beat run SVT, wenckebach AV block present  Here today for 1 month follow-up. Doing well. Has been busy at work drafting a Doctor, general practice. Has not been very active d/t work schedule. No dyspnea, CP or palpitations. Denies orthopnea, PND or leg edema. No palpitations or syncope.  Has been adherent with medications. Home SBP averaging 100s-120s.   Consuming about 6-7 beers weekly.   Review of Systems: Cardiac and Respiratory - Negative except as mentioned in HPI  Past Medical History:  Diagnosis Date   Alcoholic fatty liver    Alcoholism ([place])    sober since 10/2020   Anxiety    Chronic systolic CHF (congestive heart failure) (HCC)    HTN (hypertension)    OSA (obstructive sleep apnea)    on CPAP   Type 2 diabetes mellitus (HCC)     Current Outpatient Medications  Medication Sig Dispense Refill   apixaban (ELIQUIS) 5 MG TABS tablet TAKE 1 TABLET (5 MG TOTAL) BY MOUTH 2 (TWO) TIMES DAILY. 180 tablet 3   Calcium Carb-Cholecalciferol (CALCIUM/VITAMIN D PO) Take 2 tablets by mouth daily. OTC supplement: takes 1200 mg of calcium and 800 IU of vitamin D     dapagliflozin propanediol (FARXIGA) 10 MG TABS tablet TAKE 1 TABLET (10 MG TOTAL) BY MOUTH DAILY. 90 tablet 3   digoxin (LANOXIN) 0.125 MG tablet TAKE 1 TABLET (0.125 MG TOTAL) BY MOUTH DAILY. 90 tablet 3   folic acid (FOLVITE) 1 MG tablet Take 1 tablet (1 mg total) by mouth daily. 30 tablet 3   furosemide (LASIX) 40 MG tablet Take 40 mg daily only as needed for weight gain 90 tablet 3   methotrexate (RHEUMATREX) 2.5 MG tablet Take 4  tablets (10 mg total) by mouth once a week for 14 days, THEN 5 tablets (12.5 mg total) once a week for 14 days. 35 tablet 1   metoprolol succinate (TOPROL-XL) 100 MG 24 hr tablet Take 1 tablet (100 mg total) by mouth daily. Take with or immediately following a meal. 90 tablet 3   omeprazole (PRILOSEC) 20 MG capsule Take 1 capsule (20 mg total) by mouth daily. 30  capsule 11   PARoxetine (PAXIL) 20 MG tablet Take 1 tablet (20 mg total) by mouth daily. 90 tablet 1   potassium chloride SA (KLOR-CON M) 20 MEQ tablet Take 1 tablet (20 mEq total) by mouth daily as needed. Use only when taking Lasix. 30 tablet 3   predniSONE (DELTASONE) 10 MG tablet Take 3 tablets (30 mg total) by mouth daily or as directed by heart failure clinic 84 tablet 1   rosuvastatin (CRESTOR) 10 MG tablet Take 1 tablet (10 mg total) by mouth daily. 90 tablet 1   sacubitril-valsartan (ENTRESTO) 49-51 MG Take 1 tablet by mouth 2 (two) times daily. 60 tablet 11   spironolactone (ALDACTONE) 25 MG tablet Take 0.5 tablets (12.5 mg total) by mouth daily. 45 tablet 3   sulfamethoxazole-trimethoprim (BACTRIM DS) 800-160 MG tablet Take 1 tablet by mouth 3 (three) times a week. 15 tablet 3   No current facility-administered medications for this encounter.    No Known Allergies    Social History   Socioeconomic History   Marital status: Divorced    Spouse name: Not on file   Number of children: Not on file   Years of education: Not on file   Highest education level: Not on file  Occupational History   Not on file  Tobacco Use   Smoking status: Former   Smokeless tobacco: Never  Substance and Sexual Activity   Alcohol use: Not Currently    Comment: prior alcohol abuse; sober for 1 month   Drug use: Never   Sexual activity: Not Currently  Other Topics Concern   Not on file  Social History Narrative   Not on file   Social Determinants of Health   Financial Resource Strain: Not on file  Food Insecurity: Not on file  Transportation Needs: Not on file  Physical Activity: Not on file  Stress: Not on file  Social Connections: Not on file  Intimate Partner Violence: Not on file      Family History  Problem Relation Age of Onset   Diabetes Mellitus II Maternal Grandfather    Pulmonary embolism Neg Hx    CAD Neg Hx     Vitals:   01/13/22 1529  BP: 140/88  Pulse: 92   SpO2: 97%  Weight: 102.6 kg (226 lb 3.2 oz)    PHYSICAL EXAM: General:  Well appearing. No resp difficulty. HEENT: normal Neck: supple. no JVD. Carotids 2+ bilat; no bruits.  Cor: PMI nondisplaced. Regular rate & rhythm. No rubs, gallops or murmurs. Lungs: clear Abdomen: soft, nontender, nondistended.  Extremities: no cyanosis, clubbing, rash, edema Neuro: alert & orientedx3, cranial nerves grossly intact. moves all 4 extremities w/o difficulty. Affect pleasant   ASSESSMENT & PLAN:   1. 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 Has furosemide to use as needed. - Continue Entresto 49/51 mg BID - Continue  spiro 12.5 daily - Increase toprol to 150 mg daily - Continue Farxiga 10 daily - Continue digoxin 0.125 - BMET and Magnesium level today - See below for treatment of cardiac sarcoid  2. Cardiac sarcoidosis - cMRI 8/22 EF 20% RVEF 43%  LGE c/w sarcoid - PET 12/22 c/w sarcoid - Has RBBB on ECG. 14 day zio w/ 25 runs NSVT, 1 SVT, 1.6% PVCs, wenckebach AV block - Refer to Dr. Caryl Comes for consideration of ICD placement.  - On prednisone + MTX. Dosing per pharmacy protocol. See telephone encounter dated 01/25 for titration schedule. - Continue PPI, folic acid, PJP prophylaxis - Repeat PET in 3 months  3. DM2 - watch sugars with steroids  4. ETOH abuse - Discussed complete cessation - Currently consuming 6-7 beers weekly  5. PE - continue apixaban - can switch to 2.5 BID after 6 months based on Amplify-EXT trial  6. OSA - On CPAP  Follow-up: 2 months with Dr. Haroldine Laws, will arrange for repeat PET at f/u visit  [facility], [place], PA-C  4:03 PM  Patient seen and examined with the above-signed Advanced Practice Provider and/or Housestaff. I personally reviewed laboratory data, imaging studies and relevant  notes. I independently examined the patient and formulated the important aspects of the plan. I have edited the note to reflect any of my changes or salient points. I have personally discussed the plan with the patient and/or family.  46 y/o with systolic HF due to cardiac sarcoid (+/- ETOH). Recently started on prednisone and MTX. Tolerating well. NYHA II on GDMT. Volume status ok   General:  Well appearing. No resp difficulty HEENT: normal Neck: supple. no JVD. Carotids 2+ bilat; no bruits. No lymphadenopathy or thryomegaly appreciated. Cor: PMI nondisplaced. Regular rate & rhythm. No rubs, gallops or murmurs. Lungs: clear Abdomen: soft, nontender, nondistended. No hepatosplenomegaly. No bruits or masses. Good bowel sounds. Extremities: no cyanosis, clubbing, rash, edema Neuro: alert & orientedx3, cranial nerves grossly intact. moves all 4 extremities w/o difficulty. Affect pleasant  Stable NYHA II. Volume looks good. Continue rx for cardiac sarcoid. Titrate Toprol. Will repeat PET scan in 2-3 months. Refer for ICD given cardiac sarcoid, low EF and NSVT on Zio.   Glori Bickers, MD  11:47 PM

## 2022-01-19 NOTE — Addendum Note (Signed)
Encounter addended by: Dolores Patty, MD on: 01/19/2022 11:48 PM  Actions taken: Clinical Note Signed

## 2022-01-21 ENCOUNTER — Other Ambulatory Visit: Payer: Self-pay | Admitting: Student

## 2022-01-21 ENCOUNTER — Encounter (HOSPITAL_COMMUNITY): Payer: Self-pay | Admitting: Internal Medicine

## 2022-01-21 ENCOUNTER — Other Ambulatory Visit (HOSPITAL_COMMUNITY): Payer: Self-pay | Admitting: *Deleted

## 2022-01-21 NOTE — Telephone Encounter (Signed)
This is Dr. Crenshaw's pt 

## 2022-01-22 ENCOUNTER — Other Ambulatory Visit (HOSPITAL_COMMUNITY): Payer: Self-pay

## 2022-01-22 MED ORDER — DAPAGLIFLOZIN PROPANEDIOL 10 MG PO TABS
10.0000 mg | ORAL_TABLET | Freq: Every day | ORAL | 3 refills | Status: DC
  Filled 2022-01-22 – 2022-05-03 (×2): qty 90, 90d supply, fill #0
  Filled 2022-05-17: qty 30, 30d supply, fill #0

## 2022-01-22 MED ORDER — DAPAGLIFLOZIN PROPANEDIOL 10 MG PO TABS
10.0000 mg | ORAL_TABLET | Freq: Every day | ORAL | 11 refills | Status: DC
Start: 2022-01-22 — End: 2022-12-23
  Filled 2022-01-22: qty 30, 30d supply, fill #0
  Filled 2022-02-15: qty 30, 30d supply, fill #1
  Filled 2022-03-24: qty 30, 30d supply, fill #2
  Filled 2022-04-24: qty 30, 30d supply, fill #3
  Filled 2022-06-03 – 2022-06-17 (×2): qty 30, 30d supply, fill #4
  Filled 2022-07-12: qty 30, 30d supply, fill #5
  Filled 2022-08-10: qty 30, 30d supply, fill #6
  Filled 2022-09-16: qty 30, 30d supply, fill #7
  Filled 2022-10-19: qty 30, 30d supply, fill #8
  Filled 2022-11-24: qty 30, 30d supply, fill #9
  Filled 2022-12-21 – 2022-12-23 (×2): qty 30, 30d supply, fill #10

## 2022-01-23 ENCOUNTER — Other Ambulatory Visit (HOSPITAL_COMMUNITY): Payer: Self-pay

## 2022-01-29 ENCOUNTER — Other Ambulatory Visit (HOSPITAL_COMMUNITY): Payer: Self-pay

## 2022-01-29 ENCOUNTER — Other Ambulatory Visit: Payer: Self-pay | Admitting: Internal Medicine

## 2022-01-29 DIAGNOSIS — F3342 Major depressive disorder, recurrent, in full remission: Secondary | ICD-10-CM

## 2022-01-29 MED ORDER — PAROXETINE HCL 20 MG PO TABS
20.0000 mg | ORAL_TABLET | Freq: Every day | ORAL | 1 refills | Status: DC
  Filled 2022-01-29: qty 90, 90d supply, fill #0
  Filled 2022-05-03: qty 90, 90d supply, fill #1

## 2022-02-01 ENCOUNTER — Other Ambulatory Visit: Payer: Self-pay | Admitting: Internal Medicine

## 2022-02-01 DIAGNOSIS — E785 Hyperlipidemia, unspecified: Secondary | ICD-10-CM

## 2022-02-01 MED ORDER — ROSUVASTATIN CALCIUM 10 MG PO TABS
10.0000 mg | ORAL_TABLET | Freq: Every day | ORAL | 1 refills | Status: DC
  Filled 2022-02-01: qty 90, 90d supply, fill #0
  Filled 2022-05-03: qty 90, 90d supply, fill #1

## 2022-02-02 ENCOUNTER — Other Ambulatory Visit (HOSPITAL_COMMUNITY): Payer: Self-pay

## 2022-02-15 ENCOUNTER — Other Ambulatory Visit: Payer: Self-pay | Admitting: Cardiology

## 2022-02-16 ENCOUNTER — Other Ambulatory Visit (HOSPITAL_COMMUNITY): Payer: Self-pay

## 2022-02-17 ENCOUNTER — Other Ambulatory Visit: Payer: Self-pay | Admitting: Cardiology

## 2022-02-17 ENCOUNTER — Other Ambulatory Visit (HOSPITAL_COMMUNITY): Payer: Self-pay

## 2022-02-17 MED ORDER — DIGOXIN 125 MCG PO TABS
0.1250 mg | ORAL_TABLET | Freq: Every day | ORAL | 2 refills | Status: DC
  Filled 2022-02-17: qty 90, 90d supply, fill #0
  Filled 2022-05-17: qty 90, 90d supply, fill #1

## 2022-02-18 ENCOUNTER — Other Ambulatory Visit (HOSPITAL_COMMUNITY): Payer: Self-pay

## 2022-03-05 ENCOUNTER — Institutional Professional Consult (permissible substitution): Payer: 59 | Admitting: Internal Medicine

## 2022-03-09 ENCOUNTER — Telehealth (HOSPITAL_COMMUNITY): Payer: Self-pay | Admitting: Vascular Surgery

## 2022-03-09 ENCOUNTER — Other Ambulatory Visit (HOSPITAL_COMMUNITY): Payer: Self-pay | Admitting: *Deleted

## 2022-03-09 ENCOUNTER — Other Ambulatory Visit (HOSPITAL_COMMUNITY): Payer: Self-pay

## 2022-03-09 MED ORDER — METHOTREXATE 2.5 MG PO TABS
ORAL_TABLET | ORAL | 1 refills | Status: DC
  Filled 2022-03-09: qty 32, 28d supply, fill #0
  Filled 2022-03-09: qty 32, 30d supply, fill #0
  Filled 2022-04-05: qty 32, 28d supply, fill #1
  Filled 2022-05-03: qty 16, 14d supply, fill #2

## 2022-03-09 NOTE — Telephone Encounter (Signed)
Lvm to move 4/21 appt to 4/18 or 4/25 ?

## 2022-03-10 NOTE — Telephone Encounter (Signed)
2nd attempt to contact pt to move appt ?

## 2022-03-13 ENCOUNTER — Encounter (HOSPITAL_COMMUNITY): Payer: 59 | Admitting: Internal Medicine

## 2022-03-19 ENCOUNTER — Encounter: Payer: Self-pay | Admitting: Internal Medicine

## 2022-03-24 ENCOUNTER — Other Ambulatory Visit: Payer: Self-pay | Admitting: Cardiology

## 2022-03-24 ENCOUNTER — Other Ambulatory Visit (HOSPITAL_COMMUNITY): Payer: Self-pay

## 2022-03-24 NOTE — Telephone Encounter (Signed)
Prescription refill request for Eliquis received. ?Indication: PE ?Last office visit:01/13/22 (Bensimhon)  ?Scr: 0.95 (01/13/22)  ?Age: 46 ?Weight: 102.6kg ? ?Per Dr Bensimhon's note: "can switch to 2.5 BID after 6 months based on Amplify-EXT trial." Message sent to Dr Gala Romney to determine if dose should be decreased.  ?

## 2022-03-25 ENCOUNTER — Other Ambulatory Visit: Payer: Self-pay | Admitting: Cardiology

## 2022-03-25 ENCOUNTER — Other Ambulatory Visit (HOSPITAL_COMMUNITY): Payer: Self-pay

## 2022-03-25 MED ORDER — APIXABAN 5 MG PO TABS
5.0000 mg | ORAL_TABLET | Freq: Two times a day (BID) | ORAL | 1 refills | Status: DC
  Filled 2022-03-25: qty 180, 90d supply, fill #0

## 2022-03-25 NOTE — Telephone Encounter (Signed)
Prescription refill request for Eliquis received. ?Indication:PE ?Last office visit:10/22 ?Scr:0.9 ?Age: 46 ?Weight:102.6 kg ? ?Prescription refilled ? ?

## 2022-04-02 ENCOUNTER — Other Ambulatory Visit (HOSPITAL_COMMUNITY): Payer: Self-pay

## 2022-04-02 ENCOUNTER — Other Ambulatory Visit (HOSPITAL_COMMUNITY): Payer: Self-pay | Admitting: Internal Medicine

## 2022-04-02 MED ORDER — PREDNISONE 10 MG PO TABS
30.0000 mg | ORAL_TABLET | Freq: Every day | ORAL | 3 refills | Status: DC
  Filled 2022-04-02: qty 90, 30d supply, fill #0
  Filled 2022-04-24: qty 90, 30d supply, fill #1

## 2022-04-06 ENCOUNTER — Other Ambulatory Visit (HOSPITAL_COMMUNITY): Payer: Self-pay

## 2022-04-24 ENCOUNTER — Other Ambulatory Visit (HOSPITAL_COMMUNITY): Payer: Self-pay

## 2022-04-24 MED ORDER — PREDNISONE 5 MG PO TABS
7.5000 mg | ORAL_TABLET | Freq: Every day | ORAL | 2 refills | Status: DC
  Filled 2022-04-24: qty 40, 26d supply, fill #0
  Filled 2022-05-17: qty 40, 26d supply, fill #1

## 2022-04-27 ENCOUNTER — Other Ambulatory Visit (HOSPITAL_COMMUNITY): Payer: Self-pay

## 2022-05-03 ENCOUNTER — Other Ambulatory Visit (HOSPITAL_COMMUNITY): Payer: Self-pay | Admitting: Internal Medicine

## 2022-05-04 ENCOUNTER — Other Ambulatory Visit (HOSPITAL_COMMUNITY): Payer: Self-pay

## 2022-05-04 ENCOUNTER — Other Ambulatory Visit (HOSPITAL_COMMUNITY): Payer: Self-pay | Admitting: Internal Medicine

## 2022-05-04 MED ORDER — METHOTREXATE 2.5 MG PO TABS
20.0000 mg | ORAL_TABLET | ORAL | 3 refills | Status: DC
  Filled 2022-05-04: qty 96, 84d supply, fill #0
  Filled 2022-07-28: qty 96, 84d supply, fill #1

## 2022-05-18 ENCOUNTER — Other Ambulatory Visit (HOSPITAL_COMMUNITY): Payer: Self-pay

## 2022-06-04 ENCOUNTER — Other Ambulatory Visit (HOSPITAL_COMMUNITY): Payer: Self-pay | Admitting: Internal Medicine

## 2022-06-04 ENCOUNTER — Encounter (HOSPITAL_COMMUNITY): Payer: Self-pay | Admitting: Internal Medicine

## 2022-06-04 ENCOUNTER — Ambulatory Visit (HOSPITAL_COMMUNITY)
Admission: RE | Admit: 2022-06-04 | Discharge: 2022-06-04 | Disposition: A | Discharge status: Home / Self Care | Payer: 59 | Source: Ambulatory Visit | Attending: Internal Medicine | Admitting: Internal Medicine

## 2022-06-04 ENCOUNTER — Other Ambulatory Visit (HOSPITAL_COMMUNITY): Payer: Self-pay

## 2022-06-04 VITALS — BP 130/82 | HR 83 | Wt 228.4 lb

## 2022-06-04 DIAGNOSIS — Z9989 Dependence on other enabling machines and devices: Secondary | ICD-10-CM | POA: Diagnosis not present

## 2022-06-04 DIAGNOSIS — I4510 Unspecified right bundle-branch block: Secondary | ICD-10-CM | POA: Diagnosis not present

## 2022-06-04 DIAGNOSIS — D8685 Sarcoid myocarditis: Secondary | ICD-10-CM | POA: Diagnosis not present

## 2022-06-04 DIAGNOSIS — G4733 Obstructive sleep apnea (adult) (pediatric): Secondary | ICD-10-CM | POA: Insufficient documentation

## 2022-06-04 DIAGNOSIS — F1011 Alcohol abuse, in remission: Secondary | ICD-10-CM

## 2022-06-04 DIAGNOSIS — I5022 Chronic systolic (congestive) heart failure: Secondary | ICD-10-CM | POA: Insufficient documentation

## 2022-06-04 DIAGNOSIS — I110 Hypertensive heart disease with heart failure: Secondary | ICD-10-CM | POA: Diagnosis not present

## 2022-06-04 DIAGNOSIS — I420 Dilated cardiomyopathy: Secondary | ICD-10-CM

## 2022-06-04 DIAGNOSIS — E119 Type 2 diabetes mellitus without complications: Secondary | ICD-10-CM | POA: Insufficient documentation

## 2022-06-04 DIAGNOSIS — F1010 Alcohol abuse, uncomplicated: Secondary | ICD-10-CM | POA: Diagnosis not present

## 2022-06-04 DIAGNOSIS — Z7901 Long term (current) use of anticoagulants: Secondary | ICD-10-CM | POA: Diagnosis not present

## 2022-06-04 DIAGNOSIS — Z7984 Long term (current) use of oral hypoglycemic drugs: Secondary | ICD-10-CM | POA: Diagnosis not present

## 2022-06-04 DIAGNOSIS — Z79899 Other long term (current) drug therapy: Secondary | ICD-10-CM | POA: Insufficient documentation

## 2022-06-04 LAB — BRAIN NATRIURETIC PEPTIDE: B Natriuretic Peptide: 122.9 pg/mL — ABNORMAL HIGH (ref 0.0–100.0)

## 2022-06-04 LAB — HEMOGLOBIN A1C
Hgb A1c MFr Bld: 7.2 % — ABNORMAL HIGH (ref 4.8–5.6)
Mean Plasma Glucose: 159.94 mg/dL

## 2022-06-04 LAB — CBC
HCT: 42.8 % (ref 39.0–52.0)
Hemoglobin: 14.9 g/dL (ref 13.0–17.0)
MCH: 35 pg — ABNORMAL HIGH (ref 26.0–34.0)
MCHC: 34.8 g/dL (ref 30.0–36.0)
MCV: 100.5 fL — ABNORMAL HIGH (ref 80.0–100.0)
Platelets: 191 10*3/uL (ref 150–400)
RBC: 4.26 MIL/uL (ref 4.22–5.81)
RDW: 12.4 % (ref 11.5–15.5)
WBC: 5.5 10*3/uL (ref 4.0–10.5)
nRBC: 0 % (ref 0.0–0.2)

## 2022-06-04 MED ORDER — APIXABAN 2.5 MG PO TABS
2.5000 mg | ORAL_TABLET | Freq: Two times a day (BID) | ORAL | 6 refills | Status: DC
  Filled 2022-06-04: qty 60, 30d supply, fill #0
  Filled 2022-07-12: qty 60, 30d supply, fill #1
  Filled 2022-08-10 – 2022-09-16 (×2): qty 60, 30d supply, fill #2
  Filled 2022-10-19: qty 60, 30d supply, fill #3
  Filled 2022-12-21: qty 60, 30d supply, fill #4
  Filled 2023-02-01: qty 60, 30d supply, fill #5
  Filled 2023-03-03: qty 60, 30d supply, fill #6

## 2022-06-04 MED ORDER — METOPROLOL SUCCINATE ER 100 MG PO TB24
150.0000 mg | ORAL_TABLET | Freq: Every day | ORAL | 6 refills | Status: DC
  Filled 2022-06-04: qty 45, 30d supply, fill #0
  Filled 2022-07-12: qty 45, 30d supply, fill #1
  Filled 2022-08-10: qty 45, 30d supply, fill #2
  Filled 2022-09-16: qty 45, 30d supply, fill #3
  Filled 2022-10-19: qty 45, 30d supply, fill #4
  Filled 2022-11-24: qty 45, 30d supply, fill #5
  Filled 2022-12-21: qty 45, 30d supply, fill #6

## 2022-06-04 NOTE — Progress Notes (Signed)
ADVANCED HF CLINIC NOTE  Referring Physician: Dr. Jens Som Primary Care: Etta Grandchild, MD   HPI:  Gregory Stevenson is a 46 y/o male with h/o ETOH (quit 2021), DM2, OSA, HTN referred by Dr. 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   He was admitted 1/22 with pulmonary embolus.  Echocardiogram at that time revealed ejection fraction 30 to 35%, mild left ventricular enlargement, mild right ventricular enlargement, mild RV dysfunction, moderate pulmonary hypertension, mild left atrial enlargement and mild mitral regurgitation. Patient was treated medically. LV dysfunction felt possibly related to ETOH use.  Echocardiogram May 2022: EF 30 to 35%.  CTA August 2022 showed calcium score 0, normal coronary arteries and mildly dilated pulmonary artery.    Cardiac MRI August 2022: LVEF 20%, RVEF 43%, LGE pattern suggestive of sarcoid   PET SCAN 10/24/21  1. Extensive active myocardial inflammation involving nearly the entire left ventricle. Inflammatory findings are most severe in the anterior and inferior walls, and spare the basal anterolateral/ inferolateral and apical inferior segments. The metabolic and perfusion findings above are compatible with cardiac sarcoidosis and cardiomyopathy in the appropriate clinical setting.   2. FDG-avid thoracic and upper abdominal lymphadenopathy, hypermetabolic foci in the liver and spleen, and several FDG avid lesions in a mid- thoracic vertebral body. Taken together these findings most likely represent extensive extra-cardiac sarcoidosis, although correlation with any prior contrast-enhanced cross-sectional imaging is suggested. If prior imaging is unavailable and if clinically warranted, further imaging could be obtained including a liver MRI with contrast and whole body bone scan.   Last seen for f/u 12/11/21. Started on prednisone + MTX for sarcoid. Volume appeared stable.  14 day Zio 01/19-02/01/23: Sinus rhythm with 25  runs NSVT up to 13 beats in duration, 1.6% PVCs, 5 beat run SVT, wenckebach AV block present  Here for routine f/u. 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  Denies CP or SOB. No palpitations or syncope. Referred to EP to discuss ICD but needed to reschedule. Occasional orthostasis. Completed prednisone yesterday. On MTX 20mg  q wed. Occasional ETOH on Friday and Sat. Uses CPAP.    Review of Systems: Cardiac and Respiratory - Negative except as mentioned in HPI  Past Medical History:  Diagnosis Date   Alcoholic fatty liver    Alcoholism (HCC)    sober since 10/2020   Anxiety    Chronic systolic CHF (congestive heart failure) (HCC)    HTN (hypertension)    OSA (obstructive sleep apnea)    on CPAP   Type 2 diabetes mellitus (HCC)     Current Outpatient Medications  Medication Sig Dispense Refill   apixaban (ELIQUIS) 5 MG TABS tablet Take 1 tablet (5 mg total) by mouth 2 (two) times daily. 180 tablet 1   Calcium Carb-Cholecalciferol (CALCIUM/VITAMIN D PO) Take 2 tablets by mouth daily. OTC supplement: takes 1200 mg of calcium and 800 IU of vitamin D     dapagliflozin propanediol (FARXIGA) 10 MG TABS tablet Take 1 tablet (10 mg total) by mouth daily before breakfast. 30 tablet 11   digoxin (LANOXIN) 0.125 MG tablet Take 1 tablet (0.125 mg total) by mouth daily. 90 tablet 2   folic acid (FOLVITE) 1 MG tablet Take 1 tablet (1 mg total) by mouth daily. 30 tablet 3   furosemide (LASIX) 40 MG tablet Take 40 mg daily only as needed for weight gain 90 tablet 3   methotrexate (RHEUMATREX) 2.5  MG tablet Take 8 tablets (20 mg total) by mouth once a week. 96 tablet 3   metoprolol succinate (TOPROL-XL) 100 MG 24 hr tablet Take 1 tablet (100 mg total) by mouth daily. Take with or immediately following a meal. 90 tablet 3   omeprazole (PRILOSEC) 20 MG capsule Take 1 capsule (20 mg total) by mouth daily. 30 capsule 11   PARoxetine (PAXIL) 20 MG tablet Take 1  tablet (20 mg total) by mouth daily. 90 tablet 1   potassium chloride SA (KLOR-CON M) 20 MEQ tablet Take 1 tablet (20 mEq total) by mouth daily as needed. Use only when taking Lasix. 30 tablet 3   rosuvastatin (CRESTOR) 10 MG tablet Take 1 tablet (10 mg total) by mouth daily. 90 tablet 1   sacubitril-valsartan (ENTRESTO) 49-51 MG Take 1 tablet by mouth 2 (two) times daily. 60 tablet 11   spironolactone (ALDACTONE) 25 MG tablet Take 0.5 tablets (12.5 mg total) by mouth daily. 45 tablet 3   sulfamethoxazole-trimethoprim (BACTRIM DS) 800-160 MG tablet Take 1 tablet by mouth 3 (three) times a week. 15 tablet 3   No current facility-administered medications for this encounter.    No Known Allergies    Social History   Socioeconomic History   Marital status: Divorced    Spouse name: Not on file   Number of children: Not on file   Years of education: Not on file   Highest education level: Not on file  Occupational History   Not on file  Tobacco Use   Smoking status: Former   Smokeless tobacco: Never  Substance and Sexual Activity   Alcohol use: Not Currently    Comment: prior alcohol abuse; sober for 1 month   Drug use: Never   Sexual activity: Not Currently  Other Topics Concern   Not on file  Social History Narrative   Not on file   Social Determinants of Health   Financial Resource Strain: Not on file  Food Insecurity: Not on file  Transportation Needs: Not on file  Physical Activity: Not on file  Stress: Not on file  Social Connections: Not on file  Intimate Partner Violence: Not on file      Family History  Problem Relation Age of Onset   Diabetes Mellitus II Maternal Grandfather    Pulmonary embolism Neg Hx    CAD Neg Hx     Vitals:   06/04/22 1500  BP: 130/82  Pulse: 83  SpO2: 95%  Weight: 103.6 kg (228 lb 6.4 oz)    PHYSICAL EXAM: General:  Well appearing. No resp difficulty HEENT: normal Neck: supple. no JVD. Carotids 2+ bilat; no bruits. No  lymphadenopathy or thryomegaly appreciated. Cor: PMI nondisplaced. Regular rate & rhythm. No rubs, gallops or murmurs. Lungs: clear Abdomen: soft, nontender, nondistended. No hepatosplenomegaly. No bruits or masses. Good bowel sounds. Extremities: no cyanosis, clubbing, rash, edema Neuro: alert & orientedx3, cranial nerves grossly intact. moves all 4 extremities w/o difficulty. Affect pleasant  ASSESSMENT & PLAN:   1. 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 Lasix prn onlty - Continue Entresto 49/51 mg BID - Continue spiro 12.5 daily - Increase toprol to 150 mg daily - Continue Farxiga 10 daily - Stop digoxin - Labs today  2. Cardiac sarcoidosis - cMRI 8/22 EF  20% RVEF 43%  LGE c/w sarcoid - PET 12/22 c/w sarcoid - Has RBBB on ECG. 14 day zio w/ 25 runs NSVT, 1 SVT, 1.6% PVCs, wenckebach AV block - Refer to Dr. Graciela Husbands for consideration of ICD placement.  - PET scan at [facility] now and in 4-6 months after prednisone complete - Labs today  3. DM2 - Continue Farxiga - Check HgBa1c  4. ETOH abuse - has cut back. Limit as mucha s possible  5. PE - continue apixaban - can switch to 2.5 BID based on Amplify-EXT trial  6. OSA - On CPAP   Arvilla Meres, MD  3:28 PM

## 2022-06-04 NOTE — Addendum Note (Signed)
Encounter addended by: Noralee Space, RN on: 06/04/2022 3:50 PM  Actions taken: Medication long-term status modified, Order list changed, Diagnosis association updated, Clinical Note Signed, Charge Capture section accepted

## 2022-06-04 NOTE — Patient Instructions (Signed)
Medication Changes:  STOP Digoxin  Decrease Eliquis to 2.5 mg Twice daily   Increase Metoprolol XL to 150 mg (1 & 1/2 tabs) Daily  Lab Work:  Labs done today, your results will be available in MyChart, we will contact you for abnormal readings.  Testing/Procedures:  Your physician has requested that you have a cardiac MRI. Cardiac MRI uses a computer to create images of your heart as its beating, producing both still and moving pictures of your heart and major blood vessels. For further information please visit [email or web address]. Please follow the instruction sheet given to you today for more information. ONCE APPROVED BY INSURANCE WE WILL CALL YOU TO SCHEDULE  Referrals:  You have been referred to Dr Graciela Husbands at [facility], they will call you for an appointment  Special Instructions // Education:  Do the following things EVERYDAY: Weigh yourself in the morning before breakfast. Write it down and keep it in a log. Take your medicines as prescribed Eat low salt foods--Limit salt (sodium) to 2000 mg per day.  Stay as active as you can everyday Limit all fluids for the day to less than 2 liters   Follow-Up in: 6 months, **PLEASE CALL OUR OFFICE IN NOVEMBER TO SCHEDULE THIS APPOINTMENT.  At the [facility], you and your health needs are our priority. We have a designated team specialized in the treatment of Heart Failure. This Care Team includes your primary Heart Failure Specialized Cardiologist (physician), Advanced Practice Providers (APPs- Physician Assistants and Nurse Practitioners), and Pharmacist who all work together to provide you with the care you need, when you need it.   You may see any of the following providers on your designated Care Team at your next follow up:  Dr Arvilla Meres Dr Carron Curie, NP Robbie Lis, [place] [facility] [place], [place] Karle Plumber, PharmD   Please be sure to bring in all your medications  bottles to every appointment.   Need to Contact us:  If you have any questions or concerns before your next appointment please send [place] a message through [place] or call our office at [phone].    TO LEAVE A MESSAGE FOR THE NURSE SELECT OPTION 2, PLEASE LEAVE A MESSAGE INCLUDING: YOUR NAME DATE OF BIRTH CALL BACK NUMBER REASON FOR CALL**this is important as we prioritize the call backs  YOU WILL RECEIVE A CALL BACK THE SAME DAY AS LONG AS YOU CALL BEFORE 4:00 PM

## 2022-06-05 ENCOUNTER — Other Ambulatory Visit (HOSPITAL_COMMUNITY): Payer: Self-pay

## 2022-06-08 ENCOUNTER — Other Ambulatory Visit (HOSPITAL_COMMUNITY): Payer: Self-pay

## 2022-06-08 NOTE — Addendum Note (Signed)
Encounter addended by: Noralee Space, RN on: 06/08/2022 4:40 PM  Actions taken: Visit diagnoses modified, Order list changed, Diagnosis association updated

## 2022-06-16 ENCOUNTER — Other Ambulatory Visit (HOSPITAL_COMMUNITY): Payer: Self-pay

## 2022-06-17 ENCOUNTER — Other Ambulatory Visit (HOSPITAL_COMMUNITY): Payer: Self-pay

## 2022-07-09 ENCOUNTER — Ambulatory Visit (INDEPENDENT_AMBULATORY_CARE_PROVIDER_SITE_OTHER): Payer: 59 | Admitting: Internal Medicine

## 2022-07-09 ENCOUNTER — Encounter: Payer: Self-pay | Admitting: Internal Medicine

## 2022-07-09 VITALS — BP 110/70 | HR 87 | Ht 72.0 in | Wt 227.2 lb

## 2022-07-09 DIAGNOSIS — I5022 Chronic systolic (congestive) heart failure: Secondary | ICD-10-CM | POA: Diagnosis not present

## 2022-07-09 DIAGNOSIS — I420 Dilated cardiomyopathy: Secondary | ICD-10-CM | POA: Diagnosis not present

## 2022-07-09 NOTE — Progress Notes (Signed)
ELECTROPHYSIOLOGY CONSULT NOTE  Patient ID: Gregory Stevenson, MRN: [id number], DOB/AGE: 1976/07/26 46 y.o. Admit date: (Not on file) Date of Consult: 07/11/2022  Primary Physician: Gregory Grandchild, MD Primary Cardiologist: BCr/DB     [name] is a 46 y.o. male who is being seen today for the evaluation of ICD at the request of DB.    HPI Gregory Stevenson is a 46 y.o. 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   1/22 admitted with PEmbolism and found to have LVEF 30-35%  DATE TEST EF   1/22 Echo   30-35 % RV dysfunction   5/22 Echo   30-35 %   8/22 CTA  CaScore 0  8/22 cMRI 20% LGE cw/Sarcoid  12/22 PET  Extensive active myocardial inflammation involving nearly the entire left ventricle   Date Cr K Hgb  2/23 0.95 4.1    7/23    14.9   Event recorder  VT Nonsustained  7-13b; HR >>184 bpm   Past Medical History:  Diagnosis Date   Alcoholic fatty liver    Alcoholism (HCC)    sober since 10/2020   Anxiety    Chronic systolic CHF (congestive heart failure) (HCC)    HTN (hypertension)    OSA (obstructive sleep apnea)    on CPAP   Type 2 diabetes mellitus (HCC)       Surgical History: History reviewed. No pertinent surgical history.   Home Meds: Current Meds  Medication Sig   apixaban (ELIQUIS) 2.5 MG TABS tablet Take 1 tablet (2.5 mg total) by mouth 2 (two) times daily.   Calcium Carb-Cholecalciferol (CALCIUM/VITAMIN D PO) Take 2 tablets by mouth daily. OTC supplement: takes 1200 mg of calcium and 800 IU of vitamin D   dapagliflozin propanediol (FARXIGA) 10 MG TABS tablet Take 1 tablet (10 mg total) by mouth daily before breakfast.   folic acid (FOLVITE) 1 MG tablet Take 1 tablet (1 mg total) by mouth daily.   furosemide (LASIX) 40 MG tablet Take 40 mg daily only as needed for weight gain   methotrexate (RHEUMATREX) 2.5 MG tablet Take 8 tablets (20 mg total) by mouth once a week.   metoprolol succinate (TOPROL-XL)  100 MG 24 hr tablet Take 1.5 tablets (150 mg total) by mouth daily. Take with or immediately following a meal.   omeprazole (PRILOSEC) 20 MG capsule Take 1 capsule (20 mg total) by mouth daily.   PARoxetine (PAXIL) 20 MG tablet Take 1 tablet (20 mg total) by mouth daily.   potassium chloride SA (KLOR-CON M) 20 MEQ tablet Take 1 tablet (20 mEq total) by mouth daily as needed. Use only when taking Lasix.   rosuvastatin (CRESTOR) 10 MG tablet Take 1 tablet (10 mg total) by mouth daily.   sacubitril-valsartan (ENTRESTO) 49-51 MG Take 1 tablet by mouth 2 (two) times daily.   spironolactone (ALDACTONE) 25 MG tablet Take 0.5 tablets (12.5 mg total) by mouth daily.   sulfamethoxazole-trimethoprim (BACTRIM DS) 800-160 MG tablet Take 1 tablet by mouth 3 (three) times a week.   [DISCONTINUED] carvedilol (COREG) 12.5 MG tablet Take 1 tablet (12.5 mg total) by mouth 2 (two) times daily with a meal.    Allergies: No Known Allergies  Social History   Socioeconomic History   Marital status: Divorced    Spouse name: Not on file   Number of children: Not on file   Years of education: Not on file   Highest education level:  Not on file  Occupational History   Not on file  Tobacco Use   Smoking status: Former    Years: 3.00    Types: Cigarettes   Smokeless tobacco: Never   Tobacco comments:    Patient quit 20 years ago  Substance and Sexual Activity   Alcohol use: Not Currently    Comment: prior alcohol abuse; sober for 1 month   Drug use: Never   Sexual activity: Not Currently  Other Topics Concern   Not on file  Social History Narrative   Not on file   Social Determinants of Health   Financial Resource Strain: Not on file  Food Insecurity: Not on file  Transportation Needs: Not on file  Physical Activity: Not on file  Stress: Not on file  Social Connections: Not on file  Intimate Partner Violence: Not on file     Family History  Problem Relation Age of Onset   Diabetes Mellitus II  Maternal Grandfather    Pulmonary embolism Neg Hx    CAD Neg Hx      ROS:  Please see the history of present illness.     All other systems reviewed and negative.    Physical Exam:   Blood pressure 110/70, pulse 87, height 6' (1.829 m), weight 227 lb 3.2 oz (103.1 kg), SpO2 95 %. General: Well developed, well nourished male in no acute distress. Head: Normocephalic, atraumatic, sclera non-icteric, no xanthomas, nares are without discharge. EENT: normal  Lymph Nodes:  none Neck: Negative for carotid bruits. JVD not elevated. Back:without scoliosis kyphosis  Lungs: Clear bilaterally to auscultation without wheezes, rales, or rhonchi. Breathing is unlabored. Heart: RRR with S1 S2. No murmur . No rubs, or gallops appreciated. Abdomen: Soft, non-tender, non-distended with normoactive bowel sounds. No hepatomegaly. No rebound/guarding. No obvious abdominal masses. Msk:  Strength and tone appear normal for age. Extremities: No clubbing or cyanosis. No edema.  Distal pedal pulses are 2+ and equal bilaterally. Skin: Warm and Dry Neuro: Alert and oriented X 3. CN III-XII intact Grossly normal sensory and motor function . Psych:  Responds to questions appropriately with a normal affect.        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  He walks everywhere without difficulty including stairs.  Functional status will be important to consider in the event that his ejection fraction remains depressed.  He has cMRI scheduled 9/23 for reassessment of EF and gadolinium enhancement.  I anticipate that he would also need repeat PET scanning to decide on ongoing anti-inflammatory therapies.  We have reviewed the above, mitigating factors which  include ejection fraction and functional status.  We have reviewed and discussed transvenous versus subcutaneous ICD implantation and the role.  We will plan to follow-up with him following his cMRI    Sherryl Manges

## 2022-07-09 NOTE — Patient Instructions (Signed)
Medication Instructions:  Your physician recommends that you continue on your current medications as directed. Please refer to the Current Medication list given to you today.  *If you need a refill on your cardiac medications before your next appointment, please call your pharmacy*   Lab Work: None ordered.   If you have labs (blood work) drawn today and your tests are completely normal, you will receive your results only by: MyChart Message (if you have MyChart) OR A paper copy in the mail If you have any lab test that is abnormal or we need to change your treatment, we will call you to review the results.   Testing/Procedures: None ordered.    Follow-Up: At [facility], you and your health needs are our priority.  As part of our continuing mission to provide you with exceptional heart care, we have created designated Provider Care Teams.  These Care Teams include your primary Cardiologist (physician) and Advanced Practice Providers (APPs -  Physician Assistants and Nurse Practitioners) who all work together to provide you with the care you need, when you need it.  We recommend signing up for the patient portal called "MyChart".  Sign up information is provided on this After Visit Summary.  MyChart is used to connect with patients for Virtual Visits (Telemedicine).  Patients are able to view lab/test results, encounter notes, upcoming appointments, etc.  Non-urgent messages can be sent to your provider as well.   To learn more about what you can do with MyChart, go to [email or web address].    Your next appointment:   To be determined after cardiac MRI  Important Information About Sugar

## 2022-07-13 ENCOUNTER — Other Ambulatory Visit (HOSPITAL_COMMUNITY): Payer: Self-pay

## 2022-07-16 ENCOUNTER — Telehealth (HOSPITAL_COMMUNITY): Payer: Self-pay | Admitting: *Deleted

## 2022-07-16 NOTE — Telephone Encounter (Signed)
Cardiac MRI auth request faxed

## 2022-07-28 ENCOUNTER — Other Ambulatory Visit: Payer: Self-pay | Admitting: Internal Medicine

## 2022-07-28 ENCOUNTER — Other Ambulatory Visit (HOSPITAL_COMMUNITY): Payer: Self-pay

## 2022-07-28 DIAGNOSIS — E785 Hyperlipidemia, unspecified: Secondary | ICD-10-CM

## 2022-07-28 DIAGNOSIS — F3342 Major depressive disorder, recurrent, in full remission: Secondary | ICD-10-CM

## 2022-07-28 MED ORDER — PAROXETINE HCL 20 MG PO TABS
20.0000 mg | ORAL_TABLET | Freq: Every day | ORAL | 0 refills | Status: DC
  Filled 2022-07-28: qty 90, 90d supply, fill #0

## 2022-07-28 MED ORDER — ROSUVASTATIN CALCIUM 10 MG PO TABS
10.0000 mg | ORAL_TABLET | Freq: Every day | ORAL | 0 refills | Status: DC
  Filled 2022-07-28: qty 90, 90d supply, fill #0

## 2022-08-03 ENCOUNTER — Telehealth (HOSPITAL_COMMUNITY): Payer: Self-pay | Admitting: *Deleted

## 2022-08-03 NOTE — Telephone Encounter (Signed)
Attempted to call patient regarding upcoming cardiac MRI appointment. Left message on voicemail with name and callback number  Leylah Tarnow RN Navigator Cardiac Imaging [facility] [phone] Office [phone] Cell  

## 2022-08-04 ENCOUNTER — Other Ambulatory Visit (HOSPITAL_COMMUNITY): Payer: Self-pay | Admitting: Internal Medicine

## 2022-08-04 ENCOUNTER — Ambulatory Visit (HOSPITAL_COMMUNITY)
Admission: RE | Admit: 2022-08-04 | Discharge: 2022-08-04 | Disposition: A | Discharge status: Home / Self Care | Payer: Commercial Managed Care - HMO | Source: Ambulatory Visit | Attending: Internal Medicine | Admitting: Internal Medicine

## 2022-08-04 DIAGNOSIS — I5022 Chronic systolic (congestive) heart failure: Secondary | ICD-10-CM

## 2022-08-04 DIAGNOSIS — I420 Dilated cardiomyopathy: Secondary | ICD-10-CM

## 2022-08-04 MED ORDER — GADOBUTROL 1 MMOL/ML IV SOLN
10.0000 mL | Freq: Once | INTRAVENOUS | Status: AC | PRN
  Administered 2022-08-04: 10 mL via INTRAVENOUS

## 2022-08-11 ENCOUNTER — Other Ambulatory Visit (HOSPITAL_COMMUNITY): Payer: Self-pay

## 2022-08-13 ENCOUNTER — Telehealth (HOSPITAL_COMMUNITY): Payer: Self-pay

## 2022-08-13 ENCOUNTER — Other Ambulatory Visit (HOSPITAL_COMMUNITY): Payer: Self-pay

## 2022-08-13 ENCOUNTER — Encounter: Payer: Self-pay | Admitting: Cardiology

## 2022-08-13 NOTE — Telephone Encounter (Signed)
Advanced Heart Failure Patient Advocate Encounter   Received notification from [employer] that prior authorization is required for Eliquis 2.5MG  tablets. PA submitted and APPROVED on 08/13/2022.  Key [id number]  Effective: 08/13/2022 - 08/13/2023  Clista Bernhardt, CPhT Rx Patient Advocate Phone: [phone]

## 2022-09-02 ENCOUNTER — Telehealth (HOSPITAL_COMMUNITY): Payer: Self-pay

## 2022-09-02 NOTE — Telephone Encounter (Signed)
No answer, Left message to return call. ? ?

## 2022-09-02 NOTE — Telephone Encounter (Signed)
Patient called requesting results from cardiac MRI. Please advise

## 2022-09-03 ENCOUNTER — Encounter (HOSPITAL_COMMUNITY): Payer: Self-pay | Admitting: *Deleted

## 2022-09-07 ENCOUNTER — Telehealth: Payer: Self-pay

## 2022-09-07 NOTE — Telephone Encounter (Signed)
Attempted phone call to pt to discuss scheduling of ICD implant.  Left voicemail message to contact RN at [phone].

## 2022-09-10 ENCOUNTER — Telehealth: Payer: Self-pay | Admitting: Internal Medicine

## 2022-09-10 NOTE — Telephone Encounter (Signed)
Advised the nurse is not at the office today and will call you back.  Verbalized understanding and agreement.

## 2022-09-10 NOTE — Telephone Encounter (Signed)
Pt returning from 09/07/22 about scheduling ICD implant

## 2022-09-11 NOTE — Telephone Encounter (Signed)
Spoke with pt and advised per Dr Caryl Comes and Dr Haroldine Laws recommendation is to proceed with ICD implant.  Pt declined to schedule at this time and is agreeable to schedule appointment to discuss further with Dr Olin Pia PA-C.  Appointment scheduled for 10/13/2022 at 1040am.  Pt verbalizes understanding and agrees with current plan.

## 2022-09-11 NOTE — Telephone Encounter (Signed)
Attempted phone call to pt and left voicemail to contact RN at [phone]. 

## 2022-09-16 ENCOUNTER — Other Ambulatory Visit: Payer: Self-pay | Admitting: Cardiology

## 2022-09-16 ENCOUNTER — Other Ambulatory Visit (HOSPITAL_COMMUNITY): Payer: Self-pay

## 2022-09-17 ENCOUNTER — Other Ambulatory Visit (HOSPITAL_COMMUNITY): Payer: Self-pay

## 2022-09-17 MED ORDER — SPIRONOLACTONE 25 MG PO TABS
12.5000 mg | ORAL_TABLET | Freq: Every day | ORAL | 3 refills | Status: DC
  Filled 2022-09-17: qty 45, 90d supply, fill #0
  Filled 2022-12-21: qty 15, 30d supply, fill #1
  Filled 2023-01-24: qty 15, 30d supply, fill #2
  Filled 2023-03-03: qty 15, 30d supply, fill #3
  Filled 2023-04-09: qty 15, 30d supply, fill #4
  Filled 2023-05-12: qty 15, 30d supply, fill #5
  Filled 2023-06-14: qty 15, 30d supply, fill #6
  Filled 2023-07-21: qty 15, 30d supply, fill #7
  Filled 2023-08-25: qty 15, 30d supply, fill #8

## 2022-10-09 NOTE — Progress Notes (Unsigned)
PCP:  Etta Grandchild, MD Primary Cardiologist: Olga Millers, MD Electrophysiologist: Sherryl Manges, MD   Gregory Stevenson is a 46 y.o. male seen today for Sherryl Manges, MD for routine electrophysiology followup. Since last being seen in our clinic the patient reports doing very well.  he denies chest pain, palpitations, dyspnea, PND, orthopnea, nausea, vomiting, dizziness, syncope, edema, weight gain, or early satiety.   Past Medical History:  Diagnosis Date   Alcoholic fatty liver    Alcoholism (HCC)    sober since 10/2020   Anxiety    Chronic systolic CHF (congestive heart failure) (HCC)    HTN (hypertension)    OSA (obstructive sleep apnea)    on CPAP   Type 2 diabetes mellitus (HCC)    History reviewed. No pertinent surgical history.  Current Outpatient Medications  Medication Sig Dispense Refill   apixaban (ELIQUIS) 2.5 MG TABS tablet Take 1 tablet (2.5 mg total) by mouth 2 (two) times daily. 60 tablet 6   Calcium Carb-Cholecalciferol (CALCIUM/VITAMIN D PO) Take 2 tablets by mouth daily. OTC supplement: takes 1200 mg of calcium and 800 IU of vitamin D     dapagliflozin propanediol (FARXIGA) 10 MG TABS tablet Take 1 tablet (10 mg total) by mouth daily before breakfast. 30 tablet 11   folic acid (FOLVITE) 1 MG tablet Take 1 tablet (1 mg total) by mouth daily. 30 tablet 3   furosemide (LASIX) 40 MG tablet Take 40 mg daily only as needed for weight gain 90 tablet 3   methotrexate (RHEUMATREX) 2.5 MG tablet Take 8 tablets (20 mg total) by mouth once a week. 96 tablet 3   metoprolol succinate (TOPROL-XL) 100 MG 24 hr tablet Take 1.5 tablets (150 mg total) by mouth daily. Take with or immediately following a meal. 45 tablet 6   omeprazole (PRILOSEC) 20 MG capsule Take 1 capsule (20 mg total) by mouth daily. 30 capsule 11   PARoxetine (PAXIL) 20 MG tablet Take 1 tablet (20 mg total) by mouth daily. 90 tablet 0   potassium chloride SA (KLOR-CON M) 20 MEQ tablet Take 1 tablet (20 mEq  total) by mouth daily as needed. Use only when taking Lasix. 30 tablet 3   rosuvastatin (CRESTOR) 10 MG tablet Take 1 tablet (10 mg total) by mouth daily. 90 tablet 0   sacubitril-valsartan (ENTRESTO) 49-51 MG Take 1 tablet by mouth 2 (two) times daily. 60 tablet 11   spironolactone (ALDACTONE) 25 MG tablet Take 0.5 tablets (12.5 mg total) by mouth daily. 45 tablet 3   sulfamethoxazole-trimethoprim (BACTRIM DS) 800-160 MG tablet Take 1 tablet by mouth 3 (three) times a week. 15 tablet 3   No current facility-administered medications for this visit.    No Known Allergies  Social History   Socioeconomic History   Marital status: Divorced    Spouse name: Not on file   Number of children: Not on file   Years of education: Not on file   Highest education level: Not on file  Occupational History   Not on file  Tobacco Use   Smoking status: Former    Years: 3.00    Types: Cigarettes   Smokeless tobacco: Never   Tobacco comments:    Patient quit 20 years ago  Substance and Sexual Activity   Alcohol use: Not Currently    Comment: prior alcohol abuse; sober for 1 month   Drug use: Never   Sexual activity: Not Currently  Other Topics Concern   Not on file  Social History Narrative   Not on file   Social Determinants of Health   Financial Resource Strain: Not on file  Food Insecurity: Not on file  Transportation Needs: Not on file  Physical Activity: Not on file  Stress: Not on file  Social Connections: Not on file  Intimate Partner Violence: Not on file     Review of Systems: All other systems reviewed and are otherwise negative except as noted above.  Physical Exam: Vitals:   10/13/22 1028  BP: 126/80  Pulse: 84  SpO2: 96%  Weight: 238 lb (108 kg)  Height: 6' (1.829 m)    GEN- The patient is well appearing, alert and oriented x 3 today.   HEENT: normocephalic, atraumatic; sclera clear, conjunctiva pink; hearing intact; oropharynx clear; neck supple, no  JVP Lymph- no cervical lymphadenopathy Lungs- Clear to ausculation bilaterally, normal work of breathing.  No wheezes, rales, rhonchi Heart- Regular rate and rhythm, no murmurs, rubs or gallops, PMI not laterally displaced GI- soft, non-tender, non-distended, bowel sounds present, no hepatosplenomegaly Extremities- No peripheral edema. no clubbing or cyanosis; DP/PT/radial pulses 2+ bilaterally MS- no significant deformity or atrophy Skin- warm and dry, no rash or lesion Psych- euthymic mood, full affect Neuro- strength and sensation are intact  EKG is not ordered.   Additional studies reviewed include: Previous EP notes.   MR CARDIAC MORPHOLOGY W WO CONTRAST 08/04/2022 1. Multifocal areas of late gadolinium enhancement consistent with known cardiac sarcoid. LGE is subendocardial in basal to mid anterior/anteroseptal walls and subepicardial in basal to mid inferior/inferoseptal walls and mid to apical anterolateral wall. 2. Mild LV dilatation, mild hypertrophy, and severe systolic dysfunction (EF 29%) 3.  Normal RV size with mild systolic dysfunction (EF 44%)  Assessment and Plan:  1. NICM 2. Sarcoid cMRI as above with EF 29% and consistent with known sarcoid.  Explained risks, benefits, and alternatives to both and transvenous and subcutaneous ICD. Pt verbalized understanding and agrees to proceed. He will consider which he would prefer, and will also review with Dr. Graciela Husbands if he had a preference/recommendation.    2. Conduction disease / Bifascicular block / RBBB/LPFB Will discuss type of device with Dr. Graciela Husbands  Follow up with Dr. Graciela Husbands as usual post device; pending planning/scheduling.   Graciella Freer, PA-C  10/13/22 11:14 AM

## 2022-10-13 ENCOUNTER — Encounter: Payer: Self-pay | Admitting: Student

## 2022-10-13 ENCOUNTER — Ambulatory Visit: Payer: Commercial Managed Care - HMO | Attending: Student | Admitting: Student

## 2022-10-13 VITALS — BP 126/80 | HR 84 | Ht 72.0 in | Wt 238.0 lb

## 2022-10-13 DIAGNOSIS — D8685 Sarcoid myocarditis: Secondary | ICD-10-CM

## 2022-10-13 DIAGNOSIS — I5022 Chronic systolic (congestive) heart failure: Secondary | ICD-10-CM | POA: Diagnosis not present

## 2022-10-13 NOTE — Patient Instructions (Signed)
Medication Instructions:  Your physician recommends that you continue on your current medications as directed. Please refer to the Current Medication list given to you today.  *If you need a refill on your cardiac medications before your next appointment, please call your pharmacy*   Lab Work: None If you have labs (blood work) drawn today and your tests are completely normal, you will receive your results only by: MyChart Message (if you have MyChart) OR A paper copy in the mail If you have any lab test that is abnormal or we need to change your treatment, we will call you to review the results.   Follow-Up: At [facility], you and your health needs are our priority.  As part of our continuing mission to provide you with exceptional heart care, we have created designated Provider Care Teams.  These Care Teams include your primary Cardiologist (physician) and Advanced Practice Providers (APPs -  Physician Assistants and Nurse Practitioners) who all work together to provide you with the care you need, when you need it.   Your next appointment:   Pending procedure

## 2022-10-13 NOTE — Telephone Encounter (Signed)
Pt come in today to discuss ICD Implant. Mardelle Matte will discuss with Dr. Graciela Husbands and we will get back with the patient to get a date scheduled.  Pt was given a blank Instruction sheet to fill in when he gets scheduled. He also was given surgical scrub.  He is aware that he will have to come in for pre procedure labs.

## 2022-10-19 ENCOUNTER — Other Ambulatory Visit (HOSPITAL_COMMUNITY): Payer: Self-pay

## 2022-10-21 ENCOUNTER — Other Ambulatory Visit: Payer: Self-pay

## 2022-10-21 ENCOUNTER — Other Ambulatory Visit (HOSPITAL_COMMUNITY): Payer: Self-pay

## 2022-10-22 ENCOUNTER — Telehealth: Payer: Self-pay

## 2022-10-22 NOTE — Telephone Encounter (Signed)
Attempted phone call to pt and left voicemail message to contact RN at [phone]. 

## 2022-10-23 ENCOUNTER — Other Ambulatory Visit (HOSPITAL_COMMUNITY): Payer: Self-pay

## 2022-10-23 MED ORDER — METHOTREXATE SODIUM 2.5 MG PO TABS
20.0000 mg | ORAL_TABLET | ORAL | 1 refills | Status: DC
  Filled 2022-10-23: qty 96, 84d supply, fill #0
  Filled 2022-12-21: qty 32, 28d supply, fill #1
  Filled 2023-02-01: qty 32, 28d supply, fill #2
  Filled 2023-03-03: qty 32, 28d supply, fill #3

## 2022-10-30 IMAGING — MR MR CARD MORPHOLOGY WO/W CM
45 of 48 series · 45 of 48 positions shown · IV contrast (Contrast agent)
Comparison: none

CLINICAL DATA: Cardiomyopathy evaluation

EXAM:
CARDIAC MRI
TECHNIQUE: The patient was scanned on a 1.5 Tesla Siemens magnet. A dedicated
cardiac coil was used. Functional imaging was done using Fiesta
sequences. [DATE], and 4 chamber views were done to assess for RWMA's.
Modified Franki rule using a short axis stack was used to
calculate an ejection fraction on a dedicated work station using
[employer] software. The patient received 13 cc of Gadavist. After 10
minutes inversion recovery sequences were used to assess for
infiltration and scar tissue.
CONTRAST:  13 cc  of Gadavist

[Series 4: [id number] · axial · 8.0mm · 1.48mm/px · 1 of 16 slices shown]
[im 1/16]
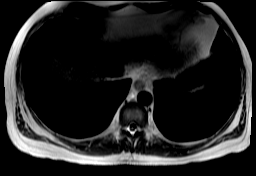

[Series 8: bSSFP · oblique · 8.0mm · 1.70mm/px · 1 of 25 slices shown (1 of 24)]
[im 1/25]
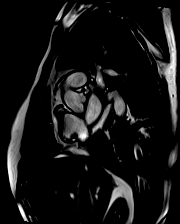

[Series 9: bSSFP · oblique · 8.0mm · 1.70mm/px · 1 of 25 slices shown (2 of 24)]
[im 1/25]
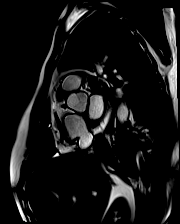

[Series 10: bSSFP · oblique · 8.0mm · 1.70mm/px · 1 of 25 slices shown (3 of 24)]
[im 1/25]
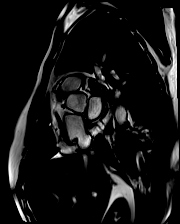

[Series 11: bSSFP · oblique · 8.0mm · 1.70mm/px · 1 of 25 slices shown (4 of 24)]
[im 1/25]
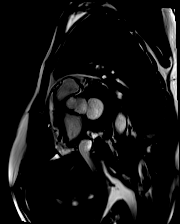

[Series 12: bSSFP · oblique · 8.0mm · 1.70mm/px · 1 of 25 slices shown (5 of 24)]
[im 1/25]
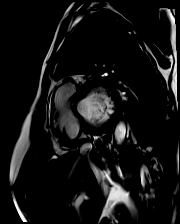

[Series 13: bSSFP · oblique · 8.0mm · 1.70mm/px · 1 of 25 slices shown (6 of 24)]
[im 1/25]
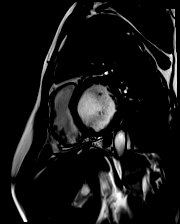

[Series 14: bSSFP · oblique · 8.0mm · 1.70mm/px · 1 of 25 slices shown (7 of 24)]
[im 1/25]
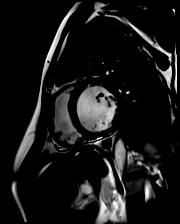

[Series 15: bSSFP · oblique · 8.0mm · 1.70mm/px · 1 of 25 slices shown (8 of 24)]
[im 1/25]
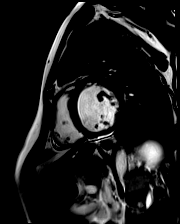

[Series 16: bSSFP · oblique · 8.0mm · 1.70mm/px · 1 of 25 slices shown (9 of 24)]
[im 1/25]
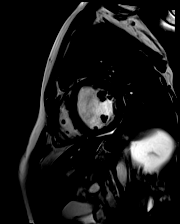

[Series 17: bSSFP · oblique · 8.0mm · 1.70mm/px · 1 of 25 slices shown (10 of 24)]
[im 1/25]
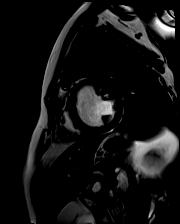

[Series 18: bSSFP · oblique · 8.0mm · 1.70mm/px · 1 of 25 slices shown (11 of 24)]
[im 1/25]
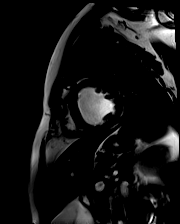

[Series 19: bSSFP · oblique · 8.0mm · 1.70mm/px · 1 of 25 slices shown (12 of 24)]
[im 1/25]
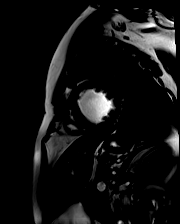

[Series 20: bSSFP · oblique · 8.0mm · 1.70mm/px · 1 of 25 slices shown (13 of 24)]
[im 1/25]
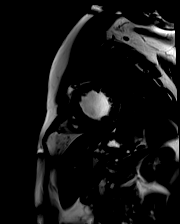

[Series 21: bSSFP · oblique · 8.0mm · 1.70mm/px · 1 of 25 slices shown (14 of 24)]
[im 1/25]
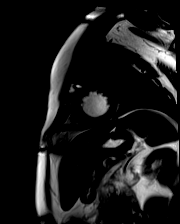

[Series 22: bSSFP · oblique · 8.0mm · 1.70mm/px · 1 of 25 slices shown (15 of 24)]
[im 1/25]
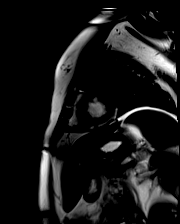

[Series 23: bSSFP · oblique · 8.0mm · 1.70mm/px · 1 of 25 slices shown (16 of 24)]
[im 1/25]
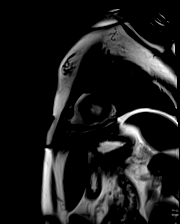

[Series 24: bSSFP · oblique · 8.0mm · 1.70mm/px · 1 of 25 slices shown (17 of 24)]
[im 1/25]
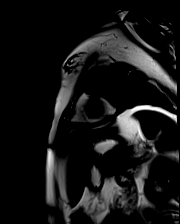

[Series 25: bSSFP · oblique · 8.0mm · 1.70mm/px · 1 of 25 slices shown (18 of 24)]
[im 1/25]
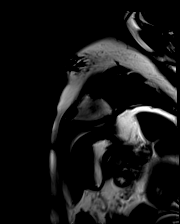

[Series 26: bSSFP · oblique · 8.0mm · 1.70mm/px · 1 of 25 slices shown (19 of 24)]
[im 1/25]
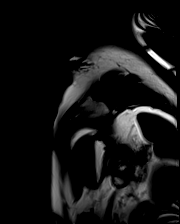

[Series 27: bSSFP · oblique · 8.0mm · 1.70mm/px · 1 of 25 slices shown (20 of 24)]
[im 1/25]
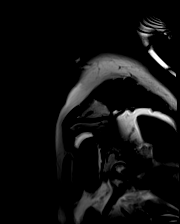

[Series 28: (id)_long_t1 · oblique · 8.0mm · 1.56mm/px · 1 of 24 slices shown]
[im 1/24]
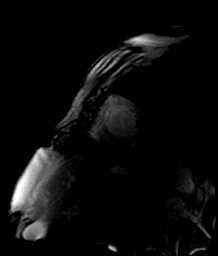

[Series 29: (id)_long_t1_moco · oblique · 8.0mm · 1.56mm/px · 1 of 24 slices shown]
[im 1/24]
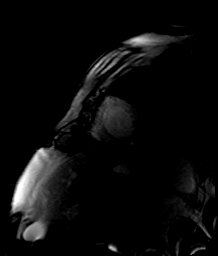

[Series 30: (id)_long_t1_moco_t1 · 1 of 3 slices shown (1 of 2)]
[im 1/3]
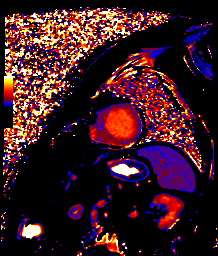

[Series 30: (id)_long_t1_moco_t1 · oblique · 8.0mm · 1.56mm/px · 1 of 3 slices shown (2 of 2)]
[im 1/3]
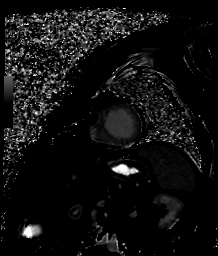

[Series 32: (id)_trufi · oblique · 8.0mm · 2.08mm/px · 1 of 9 slices shown]
[im 1/9]
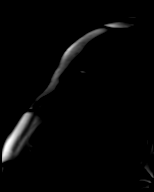

[Series 33: (id)_trufi_moco · oblique · 8.0mm · 2.08mm/px · 1 of 9 slices shown]
[im 1/9]
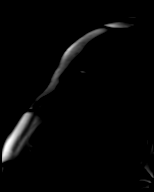

[Series 36: bSSFP · oblique · 6.0mm · 1.48mm/px · 1 of 25 slices shown (21 of 24)]
[im 1/25]
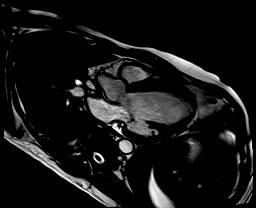

[Series 37: bSSFP · coronal · 6.0mm · 1.48mm/px · 1 of 25 slices shown (22 of 24)]
[im 1/25]
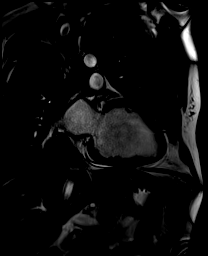

[Series 38: bSSFP · axial · 6.0mm · 1.48mm/px · 1 of 25 slices shown (23 of 24)]
[im 1/25]
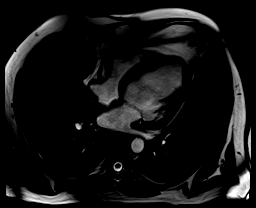

[Series 39: pre short axis · oblique · non-contrast · 8.0mm · 2.38mm/px · 1 of 10 slices shown (1 of 6)]
[im 1/10]
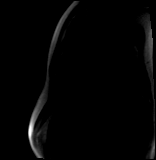

[Series 40: pre short axis · oblique · non-contrast · 8.0mm · 2.38mm/px · 1 of 10 slices shown (2 of 6)]
[im 1/10]
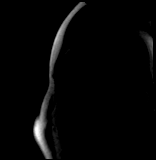

[Series 41: pre short axis · oblique · non-contrast · 8.0mm · 2.38mm/px · 1 of 10 slices shown (3 of 6)]
[im 1/10]
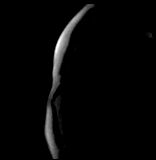

[Series 42: pre short axis · oblique · non-contrast · 8.0mm · 2.38mm/px · 1 of 10 slices shown (4 of 6)]
[im 1/10]
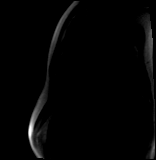

[Series 43: pre short axis · oblique · non-contrast · 8.0mm · 2.38mm/px · 1 of 10 slices shown (5 of 6)]
[im 1/10]
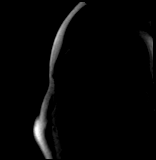

[Series 44: pre short axis · oblique · non-contrast · 8.0mm · 2.38mm/px · 1 of 10 slices shown (6 of 6)]
[im 1/10]
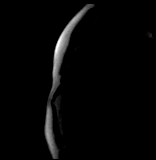

[Series 45: rest short axis · oblique · 8.0mm · 2.38mm/px · 1 of 60 slices shown (1 of 6)]
[im 1/60]
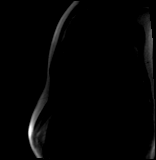

[Series 46: rest short axis · oblique · 8.0mm · 2.38mm/px · 1 of 60 slices shown (2 of 6)]
[im 1/60]
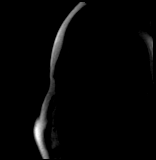

[Series 47: rest short axis · oblique · 8.0mm · 2.38mm/px · 1 of 60 slices shown (3 of 6)]
[im 1/60]
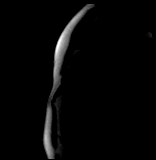

[Series 48: rest short axis · oblique · 8.0mm · 2.38mm/px · 1 of 60 slices shown (4 of 6)]
[im 1/60]
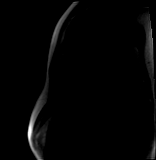

[Series 49: rest short axis · oblique · 8.0mm · 2.38mm/px · 1 of 60 slices shown (5 of 6)]
[im 1/60]
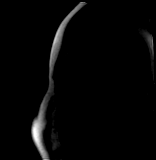

[Series 50: rest short axis · oblique · 8.0mm · 2.38mm/px · 1 of 60 slices shown (6 of 6)]
[im 1/60]
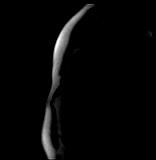

[Series 51: bSSFP · coronal · 6.0mm · 1.48mm/px · 1 of 25 slices shown (24 of 24)]
[im 1/25]
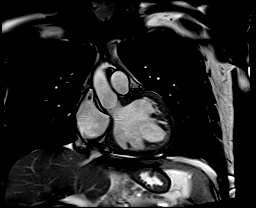

[Series 52: aortic valve cine · axial · 6.0mm · 1.48mm/px · 1 of 25 slices shown]
[im 1/25]
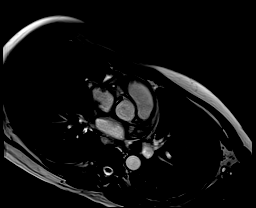

[Series 53: cine rvit · oblique · 6.0mm · 1.48mm/px · 1 of 25 slices shown]
[im 1/25]
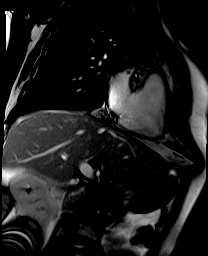

[45 of 48 positions shown; findings below may reference images not displayed]

FINDINGS: Left ventricle:

-Severe dilatation

-Severe systolic dysfunction

-Marked ECV elevation (39%). However ECV is normal (24%) in basal
lateral wall (no LGE in this area)

-Subendocardial LGE in basal to apical anterior, basal to mid
anteroseptal/inferoseptal/inferior, basal anterolateral and apical
lateral walls

LV EF: 20% (Normal 56-78%)

Absolute volumes:

LV EDV: 302mL (Normal 77-195 mL)

LV ESV: 242mL (Normal 19-72 mL)

LV SV: 60mL (Normal 51-133 mL)

CO: 4.0L/min (Normal 2.8-8.8 L/min)

Indexed volumes:

LV EDV: 144mL/sq-m (Normal 47-92 mL/sq-m)

LV ESV: 115mL/sq-m (Normal 13-30 mL/sq-m)

LV SV: 29mL/sq-m (Normal 32-62 mL/sq-m)

CI: 1.9L/min/sq-m (Normal 1.7-4.2 L/min/sq-m)

Right ventricle: Normal size with mild systolic dysfunction

RV EF:  43% (Normal 47-74%)

Absolute volumes:

RV EDV: 126mL (Normal 88-227 mL)

RV ESV: 71mL (Normal 23-103 mL)

RV SV: 55mL (Normal 52-138 mL)

CO: 3.7L/min (Normal 2.8-8.8 L/min)

Indexed volumes:

RV EDV: 60mL/sq-m (Normal 55-105 mL/sq-m)

RV ESV: 34mL/sq-m (Normal 15-43 mL/sq-m)

RV SV: 26mL/sq-m (Normal 32-64 mL/sq-m)

CI: 1.7L/min/sq-m (Normal 1.7-4.2 L/min/sq-m)

Left atrium: Mild enlargement

Right atrium: Normal size

Mitral valve: Trivial regurgitation

Aortic valve: No regurgitation

Tricuspid valve: Trivial regurgitation

Pulmonic valve: No regurgitation

Aorta: Normal proximal ascending aorta

Pericardium: Small effusion
IMPRESSION: 1. Subendocardial late gadolinium enhancement in basal to apical
anterior, basal to mid anteroseptal/inferoseptal/inferior, basal
anterolateral and apical lateral walls. While subendocardial LGE
typically is ischemic in etiology, the scar pattern is atypical as
the anterior wall LGE appears to spare part of the mid anterior wall
and the apex, whereas would expect involvement of these walls in
setting of LAD territory infarct. Sarcoidosis could also present
similarly. Recommend ischemic evaluation. If ischemic evaluation
unremarkable, then suspect cardiac sarcoid and would recommend
cardiac PET scan for further evaluation

2. Extracellular volume markedly elevated (39%), near amyloid range
(>40%). This is likely due to diffuse LGE as above. In area where
there is no LGE, ECV is normal (24% in basal lateral wall). Given
normal ECV in area without LGE, and considering lack of significant
LVH, do no suspect cardiac amyloidosis

3.  Severe LV dilatation with severe systolic dysfunction (EF 20%)

4.  Normal RV size with mild systolic dysfunction (EF 43%)

## 2022-11-02 ENCOUNTER — Other Ambulatory Visit: Payer: Self-pay | Admitting: Internal Medicine

## 2022-11-02 DIAGNOSIS — F3342 Major depressive disorder, recurrent, in full remission: Secondary | ICD-10-CM

## 2022-11-02 DIAGNOSIS — E785 Hyperlipidemia, unspecified: Secondary | ICD-10-CM

## 2022-11-03 ENCOUNTER — Other Ambulatory Visit (HOSPITAL_COMMUNITY): Payer: Self-pay

## 2022-11-03 ENCOUNTER — Other Ambulatory Visit: Payer: Self-pay | Admitting: Internal Medicine

## 2022-11-03 ENCOUNTER — Encounter: Payer: Self-pay | Admitting: Internal Medicine

## 2022-11-03 DIAGNOSIS — E785 Hyperlipidemia, unspecified: Secondary | ICD-10-CM

## 2022-11-03 DIAGNOSIS — F3342 Major depressive disorder, recurrent, in full remission: Secondary | ICD-10-CM

## 2022-11-03 MED ORDER — PAROXETINE HCL 20 MG PO TABS
20.0000 mg | ORAL_TABLET | Freq: Every day | ORAL | 0 refills | Status: DC
  Filled 2022-11-03: qty 90, 90d supply, fill #0

## 2022-11-03 MED ORDER — ROSUVASTATIN CALCIUM 10 MG PO TABS
10.0000 mg | ORAL_TABLET | Freq: Every day | ORAL | 0 refills | Status: DC
  Filled 2022-11-03: qty 90, 90d supply, fill #0

## 2022-11-04 ENCOUNTER — Other Ambulatory Visit (HOSPITAL_COMMUNITY): Payer: Self-pay

## 2022-11-10 ENCOUNTER — Encounter: Payer: Self-pay | Admitting: Internal Medicine

## 2022-11-10 ENCOUNTER — Ambulatory Visit (INDEPENDENT_AMBULATORY_CARE_PROVIDER_SITE_OTHER): Payer: Commercial Managed Care - HMO | Admitting: Internal Medicine

## 2022-11-10 VITALS — BP 128/88 | HR 87 | Temp 97.6°F | Ht 72.0 in | Wt 234.0 lb

## 2022-11-10 DIAGNOSIS — Z23 Encounter for immunization: Secondary | ICD-10-CM | POA: Diagnosis not present

## 2022-11-10 DIAGNOSIS — Z0001 Encounter for general adult medical examination with abnormal findings: Secondary | ICD-10-CM

## 2022-11-10 DIAGNOSIS — Z1211 Encounter for screening for malignant neoplasm of colon: Secondary | ICD-10-CM

## 2022-11-10 DIAGNOSIS — I5022 Chronic systolic (congestive) heart failure: Secondary | ICD-10-CM

## 2022-11-10 DIAGNOSIS — E119 Type 2 diabetes mellitus without complications: Secondary | ICD-10-CM | POA: Diagnosis not present

## 2022-11-10 DIAGNOSIS — E785 Hyperlipidemia, unspecified: Secondary | ICD-10-CM | POA: Diagnosis not present

## 2022-11-10 DIAGNOSIS — E118 Type 2 diabetes mellitus with unspecified complications: Secondary | ICD-10-CM | POA: Diagnosis not present

## 2022-11-10 NOTE — Progress Notes (Signed)
Subjective:  Patient ID: Gregory Stevenson, male    DOB: Jun 07, 1976  Age: 46 y.o. MRN: [id number]  CC: Annual Exam, Hypertension, Hyperlipidemia, Diabetes, and Congestive Heart Failure   HPI Gregory Stevenson presents for a CPX and f/up -  He walks about a mile a day.  He denies chest pain, shortness of breath, dyspnea on exertion, edema, or fatigue.  Outpatient Medications Prior to Visit  Medication Sig Dispense Refill   apixaban (ELIQUIS) 2.5 MG TABS tablet Take 1 tablet (2.5 mg total) by mouth 2 (two) times daily. 60 tablet 6   Calcium Carb-Cholecalciferol (CALCIUM/VITAMIN D PO) Take 2 tablets by mouth daily. OTC supplement: takes 1200 mg of calcium and 800 IU of vitamin D     dapagliflozin propanediol (FARXIGA) 10 MG TABS tablet Take 1 tablet (10 mg total) by mouth daily before breakfast. 30 tablet 11   folic acid (FOLVITE) 1 MG tablet Take 1 tablet (1 mg total) by mouth daily. 30 tablet 3   furosemide (LASIX) 40 MG tablet Take 40 mg daily only as needed for weight gain 90 tablet 3   methotrexate (RHEUMATREX) 2.5 MG tablet Take 8 tablets (20 mg total) by mouth once a week. 96 tablet 3   methotrexate (RHEUMATREX) 2.5 MG tablet Take 8 tablets (20 mg total) by mouth once a week. 96 tablet 1   metoprolol succinate (TOPROL-XL) 100 MG 24 hr tablet Take 1.5 tablets (150 mg total) by mouth daily. Take with or immediately following a meal. 45 tablet 6   omeprazole (PRILOSEC) 20 MG capsule Take 1 capsule (20 mg total) by mouth daily. 30 capsule 11   PARoxetine (PAXIL) 20 MG tablet Take 1 tablet (20 mg total) by mouth daily. 90 tablet 0   potassium chloride SA (KLOR-CON M) 20 MEQ tablet Take 1 tablet (20 mEq total) by mouth daily as needed. Use only when taking Lasix. 30 tablet 3   rosuvastatin (CRESTOR) 10 MG tablet Take 1 tablet (10 mg total) by mouth daily. 90 tablet 0   sacubitril-valsartan (ENTRESTO) 49-51 MG Take 1 tablet by mouth 2 (two) times daily. 60 tablet 11   spironolactone (ALDACTONE) 25 MG  tablet Take 0.5 tablets (12.5 mg total) by mouth daily. 45 tablet 3   No facility-administered medications prior to visit.    ROS Review of Systems  Constitutional: Negative.  Negative for appetite change, diaphoresis, fatigue and unexpected weight change.  HENT: Negative.    Eyes: Negative.   Respiratory:  Negative for cough, chest tightness, shortness of breath and wheezing.   Cardiovascular:  Negative for chest pain, palpitations and leg swelling.  Gastrointestinal:  Negative for abdominal pain, constipation, diarrhea, nausea and vomiting.  Endocrine: Negative.   Genitourinary: Negative.  Negative for difficulty urinating, penile swelling, scrotal swelling and testicular pain.  Musculoskeletal: Negative.  Negative for arthralgias and myalgias.  Skin: Negative.   Neurological:  Positive for dizziness. Negative for weakness.  Hematological:  Negative for adenopathy. Does not bruise/bleed easily.  Psychiatric/Behavioral: Negative.  Negative for decreased concentration, dysphoric mood, sleep disturbance and suicidal ideas. The patient is not nervous/anxious.     Objective:  BP 128/88 (BP Location: Left Arm, Patient Position: Sitting, Cuff Size: Large)   Pulse 87   Temp 97.6 F (36.4 C) (Oral)   Ht 6' (1.829 m)   Wt 234 lb (106.1 kg)   SpO2 93%   BMI 31.74 kg/m   BP Readings from Last 3 Encounters:  11/10/22 128/88  10/13/22 126/80  07/09/22 110/70  Wt Readings from Last 3 Encounters:  11/10/22 234 lb (106.1 kg)  10/13/22 238 lb (108 kg)  07/09/22 227 lb 3.2 oz (103.1 kg)    Physical Exam Vitals reviewed.  Constitutional:      Appearance: Normal appearance.  HENT:     Mouth/Throat:     Mouth: Mucous membranes are moist.  Eyes:     General: No scleral icterus.    Conjunctiva/sclera: Conjunctivae normal.  Cardiovascular:     Rate and Rhythm: Normal rate and regular rhythm.     Heart sounds: No murmur heard. Pulmonary:     Effort: Pulmonary effort is normal.      Breath sounds: No stridor. No wheezing, rhonchi or rales.  Abdominal:     General: Abdomen is flat.     Palpations: There is no mass.     Tenderness: There is no abdominal tenderness. There is no guarding.     Hernia: No hernia is present.  Musculoskeletal:        General: Normal range of motion.     Cervical back: Neck supple.     Right lower leg: No edema.     Left lower leg: No edema.  Lymphadenopathy:     Cervical: No cervical adenopathy.  Skin:    General: Skin is warm and dry.  Neurological:     General: No focal deficit present.     Mental Status: He is alert.  Psychiatric:        Mood and Affect: Mood normal.        Behavior: Behavior normal.     Lab Results  Component Value Date   WBC 5.5 06/04/2022   HGB 14.9 06/04/2022   HCT 42.8 06/04/2022   PLT 191 06/04/2022   GLUCOSE 187 (H) 01/13/2022   CHOL 159 12/09/2020   TRIG 109 12/09/2020   HDL 26 (L) 12/09/2020   LDLCALC 111 (H) 12/09/2020   ALT 54 (H) 12/17/2021   AST 47 (H) 12/17/2021   NA 137 01/13/2022   K 4.1 01/13/2022   CL 95 (L) 01/13/2022   CREATININE 0.95 01/13/2022   BUN 21 (H) 01/13/2022   CO2 28 01/13/2022   TSH 1.733 12/07/2020   INR 1.3 (H) 12/06/2020   HGBA1C 7.2 (H) 06/04/2022   MICROALBUR 13.3 (H) 07/24/2021    MR CARDIAC VELOCITY FLOW MAP  Result Date: 08/07/2022 CLINICAL DATA:  Cardiac sarcoidosis EXAM: CARDIAC MRI TECHNIQUE: The patient was scanned on a 1.5 Tesla Siemens magnet. A dedicated cardiac coil was used. Functional imaging was done using Fiesta sequences. 2,3, and 4 chamber views were done to assess for RWMA's. Modified Simpson's rule using a short axis stack was used to calculate an ejection fraction on a dedicated work [employer]. The patient received 10 cc of Gadavist. After 10 minutes inversion recovery sequences were used to assess for infiltration and scar tissue. Phase contrast velocity mapping was performed. 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 Subendocardial LGE in basal to mid anterior/anteroseptal wall, subepicardial LGE in basal to mid inferior/inferoseptal wall and mid to apical anterolateral wall. LV EF: 29% (Normal 56-78%) Absolute volumes: LV EDV: (Normal 77-195 mL) LV ESV: (Normal 19-72 mL) LV SV: 79mL (Normal 51-133 mL) CO: 5.1L/min (Normal 2.8-8.8 L/min) Indexed volumes: LV EDV: 137mL/sq-m (Normal 47-92 mL/sq-m) LV ESV: 58mL/sq-m (Normal 13-30 mL/sq-m)  LV SV: 98mL/sq-m (Normal 32-62 mL/sq-m) CI: 2.2L/min/sq-m (Normal 1.7-4.2 L/min/sq-m) Right ventricle: Normal size with mild systolic dysfunction RV EF:  44% (Normal 47-74%) Absolute volumes: RV EDV: (Normal 88-227 mL) RV ESV: 5mL (Normal 23-103 mL) RV SV: 53mL (Normal 52-138 mL) CO: 4.7L/min (Normal 2.8-8.8 L/min) Indexed volumes: RV EDV: 67mL/sq-m (Normal 55-105 mL/sq-m) RV ESV: 39mL/sq-m (Normal 15-43 mL/sq-m) RV SV: 3mL/sq-m (Normal 32-64 mL/sq-m) CI: 2.1L/min/sq-m (Normal 1.7-4.2 L/min/sq-m) Left atrium: Normal size Right atrium: Normal size Mitral valve: No regurgitation Aortic valve: Tricuspid. No regurgitation Tricuspid valve: No regurgitation Pulmonic valve: No regurgitation Aorta: Normal proximal ascending aorta Pericardium: Normal IMPRESSION: 1. Multifocal areas of late gadolinium enhancement consistent with known cardiac sarcoid. LGE is subendocardial in basal to mid anterior/anteroseptal walls and subepicardial in basal to mid inferior/inferoseptal walls and mid to apical anterolateral wall. 2. Mild LV dilatation, mild hypertrophy, and severe systolic dysfunction (EF 29%) 3.  Normal RV size with mild systolic dysfunction (EF 44%) Electronically Signed   By: Epifanio Lesches M.D.   On: 08/07/2022 14:35   MR CARDIAC MORPHOLOGY W WO CONTRAST  Result Date:  08/07/2022 CLINICAL DATA:  Cardiac sarcoidosis EXAM: CARDIAC MRI TECHNIQUE: The patient was scanned on a 1.5 Tesla Siemens magnet. A dedicated cardiac coil was used. Functional imaging was done using Fiesta sequences. 2,3, and 4 chamber views were done to assess for RWMA's. Modified Simpson's rule using a short axis stack was used to calculate an ejection fraction on a dedicated work [employer]. The patient received 10 cc of Gadavist. After 10 minutes inversion recovery sequences were used to assess for infiltration and scar tissue. Phase contrast velocity mapping was performed. CONTRAST:  10 cc  of Gadavist FINDINGS: Left ventricle: -Mild hypertrophy -Mild dilatation -Severe systolic dysfunction -Significantly elevated ECV (41%) -Normal T2 values -Multifocal areas of LGE consistent with known cardiac sarcoid. Subendocardial LGE in basal to mid anterior/anteroseptal wall, subepicardial LGE in basal to mid inferior/inferoseptal wall and mid to apical anterolateral wall. LV EF: 29% (Normal 56-78%) Absolute volumes: LV EDV: (Normal 77-195 mL) LV ESV: (Normal 19-72 mL) LV SV: 33mL (Normal 51-133 mL) CO: 5.1L/min (Normal 2.8-8.8 L/min) Indexed volumes: LV EDV: 122mL/sq-m (Normal 47-92 mL/sq-m) LV ESV: 33mL/sq-m (Normal 13-30 mL/sq-m) LV SV: 23mL/sq-m (Normal 32-62 mL/sq-m) CI: 2.2L/min/sq-m (Normal 1.7-4.2 L/min/sq-m) Right ventricle: Normal size with mild systolic dysfunction RV EF:  44% (Normal 47-74%) Absolute volumes: RV EDV: (Normal 88-227 mL) RV ESV: 41mL (Normal 23-103 mL) RV SV: 78mL (Normal 52-138 mL) CO: 4.7L/min (Normal 2.8-8.8 L/min) Indexed volumes: RV EDV: 66mL/sq-m (Normal 55-105 mL/sq-m) RV ESV: 37mL/sq-m (Normal 15-43 mL/sq-m) RV SV: 49mL/sq-m (Normal 32-64 mL/sq-m) CI: 2.1L/min/sq-m (Normal 1.7-4.2 L/min/sq-m) Left atrium: Normal size Right atrium: Normal size Mitral valve: No regurgitation Aortic valve: Tricuspid.  No regurgitation Tricuspid valve: No regurgitation  Pulmonic valve: No regurgitation Aorta: Normal proximal ascending aorta Pericardium: Normal IMPRESSION: 1. Multifocal areas of late gadolinium enhancement consistent with known cardiac sarcoid. LGE is subendocardial in basal to mid anterior/anteroseptal walls and subepicardial in basal to mid inferior/inferoseptal walls and mid to apical anterolateral wall. 2. Mild LV dilatation, mild hypertrophy, and severe systolic dysfunction (EF 29%) 3.  Normal RV size with mild systolic dysfunction (EF 44%) Electronically Signed   By: Epifanio Lesches M.D.   On: 08/07/2022 14:34    Assessment & Plan:   Garon was seen today for annual exam, hypertension, hyperlipidemia, diabetes and congestive heart failure.  Diagnoses and all orders for this visit:  Type  2 diabetes mellitus without complication, without long-term current use of insulin (HCC)- Will monitor his A1c. -     Microalbumin / creatinine urine ratio; Future -     Hemoglobin A1c; Future -     Urinalysis, Routine w reflex microscopic; Future -     Basic metabolic panel; Future  Type II diabetes mellitus with complication (HCC) -     Microalbumin / creatinine urine ratio; Future -     Hemoglobin A1c; Future -     Urinalysis, Routine w reflex microscopic; Future -     HM Diabetes Foot Exam -     Ambulatory referral to Ophthalmology  Hyperlipidemia LDL goal <70- Will check his fasting lipid panel to see if he has achieved his LDL goal. -     Lipid panel; Future -     Hepatic function panel; Future -     TSH; Future  Encounter for general adult medical examination with abnormal findings- Exam completed, labs reviewed, vaccines reviewed and updated, cancer screenings addressed, patient education was given. -     PSA; Future  Screen for colon cancer -     Cologuard  Chronic systolic heart failure (HCC)- He has a normal volume status. -     Basic metabolic panel; Future  Other orders -     Flu Vaccine QUAD 6+ mos PF IM (Fluarix Quad  PF)   I am having Gregory AxeBrian Urick "Smitty" maintain his furosemide, folic acid, omeprazole, Calcium Carb-Cholecalciferol (CALCIUM/VITAMIN D PO), Entresto, potassium chloride SA, dapagliflozin propanediol, methotrexate, apixaban, metoprolol succinate, spironolactone, methotrexate, PARoxetine, and rosuvastatin.  No orders of the defined types were placed in this encounter.    Follow-up: Return in about 6 months (around 05/12/2023).  Sanda Lingerhomas Marche Hottenstein, MD

## 2022-11-10 NOTE — Patient Instructions (Signed)
Health Maintenance, Male Adopting a healthy lifestyle and getting preventive care are important in promoting health and wellness. Ask your health care provider about: The right schedule for you to have regular tests and exams. Things you can do on your own to prevent diseases and keep yourself healthy. What should I know about diet, weight, and exercise? Eat a healthy diet  Eat a diet that includes plenty of vegetables, fruits, low-fat dairy products, and lean protein. Do not eat a lot of foods that are high in solid fats, added sugars, or sodium. Maintain a healthy weight Body mass index (BMI) is a measurement that can be used to identify possible weight problems. It estimates body fat based on height and weight. Your health care provider can help determine your BMI and help you achieve or maintain a healthy weight. Get regular exercise Get regular exercise. This is one of the most important things you can do for your health. Most adults should: Exercise for at least 150 minutes each week. The exercise should increase your heart rate and make you sweat (moderate-intensity exercise). Do strengthening exercises at least twice a week. This is in addition to the moderate-intensity exercise. Spend less time sitting. Even light physical activity can be beneficial. Watch cholesterol and blood lipids Have your blood tested for lipids and cholesterol at 48 years of age, then have this test every 5 years. You may need to have your cholesterol levels checked more often if: Your lipid or cholesterol levels are high. You are older than 48 years of age. You are at high risk for heart disease. What should I know about cancer screening? Many types of cancers can be detected early and may often be prevented. Depending on your health history and family history, you may need to have cancer screening at various ages. This may include screening for: Colorectal cancer. Prostate cancer. Skin cancer. Lung  cancer. What should I know about heart disease, diabetes, and high blood pressure? Blood pressure and heart disease High blood pressure causes heart disease and increases the risk of stroke. This is more likely to develop in people who have high blood pressure readings or are overweight. Talk with your health care provider about your target blood pressure readings. Have your blood pressure checked: Every 3-5 years if you are 18-39 years of age. Every year if you are 40 years old or older. If you are between the ages of 65 and 75 and are a current or former smoker, ask your health care provider if you should have a one-time screening for abdominal aortic aneurysm (AAA). Diabetes Have regular diabetes screenings. This checks your fasting blood sugar level. Have the screening done: Once every three years after age 45 if you are at a normal weight and have a low risk for diabetes. More often and at a younger age if you are overweight or have a high risk for diabetes. What should I know about preventing infection? Hepatitis B If you have a higher risk for hepatitis B, you should be screened for this virus. Talk with your health care provider to find out if you are at risk for hepatitis B infection. Hepatitis C Blood testing is recommended for: Everyone born from 1945 through 1965. Anyone with known risk factors for hepatitis C. Sexually transmitted infections (STIs) You should be screened each year for STIs, including gonorrhea and chlamydia, if: You are sexually active and are younger than 48 years of age. You are older than 48 years of age and your   health care provider tells you that you are at risk for this type of infection. Your sexual activity has changed since you were last screened, and you are at increased risk for chlamydia or gonorrhea. Ask your health care provider if you are at risk. Ask your health care provider about whether you are at high risk for HIV. Your health care provider  may recommend a prescription medicine to help prevent HIV infection. If you choose to take medicine to prevent HIV, you should first get tested for HIV. You should then be tested every 3 months for as long as you are taking the medicine. Follow these instructions at home: Alcohol use Do not drink alcohol if your health care provider tells you not to drink. If you drink alcohol: Limit how much you have to 0-2 drinks a day. Know how much alcohol is in your drink. In the [place] one drink equals one 12 oz bottle of beer (355 mL), one 5 oz glass of wine (148 mL), or one 1 oz glass of hard liquor (44 mL). Lifestyle Do not use any products that contain nicotine or tobacco. These products include cigarettes, chewing tobacco, and vaping devices, such as e-cigarettes. If you need help quitting, ask your health care provider. Do not use street drugs. Do not share needles. Ask your health care provider for help if you need support or information about quitting drugs. General instructions Schedule regular health, dental, and eye exams. Stay current with your vaccines. Tell your health care provider if: You often feel depressed. You have ever been abused or do not feel safe at home. Summary Adopting a healthy lifestyle and getting preventive care are important in promoting health and wellness. Follow your health care provider's instructions about healthy diet, exercising, and getting tested or screened for diseases. Follow your health care provider's instructions on monitoring your cholesterol and blood pressure. This information is not intended to replace advice given to you by your health care provider. Make sure you discuss any questions you have with your health care provider. Document Revised: 03/31/2021 Document Reviewed: 03/31/2021 Elsevier Patient Education  2023 Elsevier Inc.  

## 2022-11-20 ENCOUNTER — Encounter: Payer: Self-pay | Admitting: Internal Medicine

## 2022-11-20 ENCOUNTER — Other Ambulatory Visit (HOSPITAL_COMMUNITY): Payer: Self-pay

## 2022-11-21 ENCOUNTER — Encounter: Payer: Self-pay | Admitting: Internal Medicine

## 2022-11-24 ENCOUNTER — Other Ambulatory Visit: Payer: Self-pay

## 2022-11-25 ENCOUNTER — Other Ambulatory Visit (HOSPITAL_COMMUNITY): Payer: Self-pay

## 2022-11-26 ENCOUNTER — Telehealth: Payer: Self-pay | Admitting: Internal Medicine

## 2022-11-26 NOTE — Telephone Encounter (Signed)
Pt calling to set up ICD implant discussed at this last OV

## 2022-11-30 NOTE — Telephone Encounter (Signed)
Spoke with pt and he is scheduled for ICD Implant on 12/24/22. He will have labs done at his PCP next week.

## 2022-12-01 NOTE — Telephone Encounter (Signed)
LM for pt to call back. I need to move his procedure. I told the pt 12/24/22 but [facility] only wants to do 2 procedures that day.

## 2022-12-02 NOTE — Telephone Encounter (Signed)
I spoke with pt and his procedure has been moved to 01/06/23 @ 11:00. He will get labs done at his PCP.   I will send letter via [place] after I get clarification from [facility] on holding his Eliquis.

## 2022-12-08 ENCOUNTER — Other Ambulatory Visit: Payer: Self-pay | Admitting: Internal Medicine

## 2022-12-08 DIAGNOSIS — I420 Dilated cardiomyopathy: Secondary | ICD-10-CM

## 2022-12-08 DIAGNOSIS — I5022 Chronic systolic (congestive) heart failure: Secondary | ICD-10-CM

## 2022-12-21 ENCOUNTER — Other Ambulatory Visit (HOSPITAL_COMMUNITY): Payer: Self-pay

## 2022-12-22 ENCOUNTER — Other Ambulatory Visit (HOSPITAL_COMMUNITY): Payer: Self-pay | Admitting: Internal Medicine

## 2022-12-22 ENCOUNTER — Other Ambulatory Visit (HOSPITAL_COMMUNITY): Payer: Self-pay

## 2022-12-23 ENCOUNTER — Other Ambulatory Visit (HOSPITAL_COMMUNITY): Payer: Self-pay

## 2022-12-23 ENCOUNTER — Telehealth (HOSPITAL_COMMUNITY): Payer: Self-pay

## 2022-12-23 ENCOUNTER — Encounter: Payer: Self-pay | Admitting: Cardiology

## 2022-12-23 MED ORDER — DAPAGLIFLOZIN PROPANEDIOL 10 MG PO TABS
10.0000 mg | ORAL_TABLET | Freq: Every day | ORAL | 11 refills | Status: DC
  Filled 2022-12-23: qty 30, 30d supply, fill #0

## 2022-12-23 MED ORDER — ENTRESTO 49-51 MG PO TABS
1.0000 | ORAL_TABLET | Freq: Two times a day (BID) | ORAL | 11 refills | Status: DC
  Filled 2022-12-23: qty 60, 30d supply, fill #0
  Filled 2023-02-01: qty 60, 30d supply, fill #1
  Filled 2023-03-03: qty 60, 30d supply, fill #2

## 2022-12-23 NOTE — Telephone Encounter (Addendum)
Advanced Heart Failure Patient Advocate Encounter  This patients insurance plan does not cover Farxiga on its formulary. Changing patient to Gregory Stevenson with prior authorization approval.  Prior authorization is required for Jardiance 10 MG. PA submitted and APPROVED on 12/23/22. Test claim returns $15 copay for 30 days.  Key [id number] Effective: 12/22/22 - 12/23/23  I have messaged to have a new rx sent in, and spoke with the patient about the change in medication.  Clista Bernhardt, CPhT Rx Patient Advocate Phone: [phone]

## 2022-12-24 ENCOUNTER — Other Ambulatory Visit (HOSPITAL_COMMUNITY): Payer: Self-pay

## 2022-12-24 ENCOUNTER — Other Ambulatory Visit (HOSPITAL_COMMUNITY): Payer: Self-pay | Admitting: *Deleted

## 2022-12-24 MED ORDER — EMPAGLIFLOZIN 10 MG PO TABS
10.0000 mg | ORAL_TABLET | Freq: Every day | ORAL | 3 refills | Status: DC
  Filled 2022-12-24: qty 30, 30d supply, fill #0
  Filled 2023-02-01: qty 30, 30d supply, fill #1
  Filled 2023-03-03: qty 30, 30d supply, fill #2
  Filled 2023-04-09: qty 30, 30d supply, fill #3
  Filled 2023-05-12: qty 30, 30d supply, fill #4
  Filled 2023-06-14: qty 30, 30d supply, fill #5
  Filled 2023-07-21: qty 30, 30d supply, fill #6
  Filled 2023-08-25: qty 30, 30d supply, fill #7
  Filled 2023-09-27: qty 30, 30d supply, fill #8
  Filled 2023-10-28: qty 30, 30d supply, fill #9
  Filled 2023-12-06: qty 30, 30d supply, fill #10

## 2022-12-29 ENCOUNTER — Other Ambulatory Visit (HOSPITAL_COMMUNITY): Payer: Self-pay

## 2022-12-30 ENCOUNTER — Other Ambulatory Visit (INDEPENDENT_AMBULATORY_CARE_PROVIDER_SITE_OTHER): Payer: 59

## 2022-12-30 DIAGNOSIS — E119 Type 2 diabetes mellitus without complications: Secondary | ICD-10-CM | POA: Diagnosis not present

## 2022-12-30 DIAGNOSIS — I5022 Chronic systolic (congestive) heart failure: Secondary | ICD-10-CM

## 2022-12-30 DIAGNOSIS — Z125 Encounter for screening for malignant neoplasm of prostate: Secondary | ICD-10-CM

## 2022-12-30 DIAGNOSIS — I420 Dilated cardiomyopathy: Secondary | ICD-10-CM

## 2022-12-30 DIAGNOSIS — E118 Type 2 diabetes mellitus with unspecified complications: Secondary | ICD-10-CM

## 2022-12-30 DIAGNOSIS — E785 Hyperlipidemia, unspecified: Secondary | ICD-10-CM | POA: Diagnosis not present

## 2022-12-30 DIAGNOSIS — Z0001 Encounter for general adult medical examination with abnormal findings: Secondary | ICD-10-CM

## 2022-12-30 LAB — HEPATIC FUNCTION PANEL
ALT: 80 U/L — ABNORMAL HIGH (ref 0–53)
AST: 69 U/L — ABNORMAL HIGH (ref 0–37)
Albumin: 5 g/dL (ref 3.5–5.2)
Alkaline Phosphatase: 87 U/L (ref 39–117)
Bilirubin, Direct: 0.2 mg/dL (ref 0.0–0.3)
Total Bilirubin: 0.8 mg/dL (ref 0.2–1.2)
Total Protein: 7.7 g/dL (ref 6.0–8.3)

## 2022-12-30 LAB — PSA: PSA: 0.9 ng/mL (ref 0.10–4.00)

## 2022-12-30 LAB — URINALYSIS, ROUTINE W REFLEX MICROSCOPIC
Bilirubin Urine: NEGATIVE
Hgb urine dipstick: NEGATIVE
Ketones, ur: NEGATIVE
Leukocytes,Ua: NEGATIVE
Nitrite: NEGATIVE
RBC / HPF: NONE SEEN (ref 0–?)
Specific Gravity, Urine: 1.015 (ref 1.000–1.030)
Total Protein, Urine: NEGATIVE
Urine Glucose: >=1000 — AB
Urobilinogen, UA: 0.2 (ref 0.0–1.0)
WBC, UA: NONE SEEN (ref 0–?)
pH: 5.5 (ref 5.0–8.0)

## 2022-12-30 LAB — BASIC METABOLIC PANEL
BUN: 9 mg/dL (ref 6–23)
CO2: 29 mEq/L (ref 19–32)
Calcium: 9.6 mg/dL (ref 8.4–10.5)
Chloride: 102 mEq/L (ref 96–112)
Creatinine, Ser: 0.68 mg/dL (ref 0.40–1.50)
GFR: 110.03 mL/min (ref >60.00)
Glucose, Bld: 151 mg/dL — ABNORMAL HIGH (ref 70–99)
Potassium: 4.6 mEq/L (ref 3.5–5.1)
Sodium: 143 mEq/L (ref 135–145)

## 2022-12-30 LAB — LDL CHOLESTEROL, DIRECT: Direct LDL: 85 mg/dL

## 2022-12-30 LAB — CBC WITH DIFFERENTIAL/PLATELET
Basophils Absolute: 0 10*3/uL (ref 0.0–0.1)
Basophils Relative: 0.6 % (ref 0.0–3.0)
Eosinophils Absolute: 0.1 10*3/uL (ref 0.0–0.7)
Eosinophils Relative: 1.5 % (ref 0.0–5.0)
HCT: 42.7 % (ref 39.0–52.0)
Hemoglobin: 14.8 g/dL (ref 13.0–17.0)
Lymphocytes Relative: 21.2 % (ref 12.0–46.0)
Lymphs Abs: 0.9 10*3/uL (ref 0.7–4.0)
MCHC: 34.7 g/dL (ref 30.0–36.0)
MCV: 103.3 fl — ABNORMAL HIGH (ref 78.0–100.0)
Monocytes Absolute: 0.5 10*3/uL (ref 0.1–1.0)
Monocytes Relative: 12.2 % — ABNORMAL HIGH (ref 3.0–12.0)
Neutro Abs: 2.6 10*3/uL (ref 1.4–7.7)
Neutrophils Relative %: 64.5 % (ref 43.0–77.0)
Platelets: 153 10*3/uL (ref 150.0–400.0)
RBC: 4.13 Mil/uL — ABNORMAL LOW (ref 4.22–5.81)
RDW: 13.3 % (ref 11.5–15.5)
WBC: 4.1 10*3/uL (ref 4.0–10.5)

## 2022-12-30 LAB — MICROALBUMIN / CREATININE URINE RATIO
Creatinine,U: 51.2 mg/dL
Microalb Creat Ratio: 5.4 mg/g (ref 0.0–30.0)
Microalb, Ur: 2.8 mg/dL — ABNORMAL HIGH (ref 0.0–1.9)

## 2022-12-30 LAB — HEMOGLOBIN A1C: Hgb A1c MFr Bld: 7.7 % — ABNORMAL HIGH (ref 4.6–6.5)

## 2022-12-30 LAB — TSH: TSH: 1.3 u[IU]/mL (ref 0.35–5.50)

## 2022-12-30 LAB — LIPID PANEL
Cholesterol: 175 mg/dL (ref 0–200)
HDL: 52.1 mg/dL (ref >39.00)
NonHDL: 122.66
Total CHOL/HDL Ratio: 3
Triglycerides: 314 mg/dL — ABNORMAL HIGH (ref 0.0–149.0)
VLDL: 62.8 mg/dL — ABNORMAL HIGH (ref 0.0–40.0)

## 2023-01-05 NOTE — Pre-Procedure Instructions (Signed)
Attempted to call patient regarding procedure instructions for tomorrow.  Unable to leave voicemail, mailbox full.

## 2023-01-06 ENCOUNTER — Ambulatory Visit (HOSPITAL_COMMUNITY): Payer: 59

## 2023-01-06 ENCOUNTER — Other Ambulatory Visit: Payer: Self-pay

## 2023-01-06 ENCOUNTER — Encounter (HOSPITAL_COMMUNITY)
Admission: RE | Disposition: A | Discharge status: Home / Self Care | Payer: Self-pay | Source: Home / Self Care | Attending: Internal Medicine

## 2023-01-06 ENCOUNTER — Ambulatory Visit (HOSPITAL_COMMUNITY)
Admission: RE | Admit: 2023-01-06 | Discharge: 2023-01-06 | Disposition: A | Discharge status: Home / Self Care | Payer: 59 | Attending: Internal Medicine | Admitting: Internal Medicine

## 2023-01-06 DIAGNOSIS — I2699 Other pulmonary embolism without acute cor pulmonale: Secondary | ICD-10-CM | POA: Diagnosis not present

## 2023-01-06 DIAGNOSIS — D8685 Sarcoid myocarditis: Secondary | ICD-10-CM | POA: Insufficient documentation

## 2023-01-06 DIAGNOSIS — I452 Bifascicular block: Secondary | ICD-10-CM | POA: Insufficient documentation

## 2023-01-06 DIAGNOSIS — Z8249 Family history of ischemic heart disease and other diseases of the circulatory system: Secondary | ICD-10-CM | POA: Insufficient documentation

## 2023-01-06 DIAGNOSIS — I110 Hypertensive heart disease with heart failure: Secondary | ICD-10-CM | POA: Diagnosis not present

## 2023-01-06 DIAGNOSIS — Z95810 Presence of automatic (implantable) cardiac defibrillator: Secondary | ICD-10-CM | POA: Diagnosis not present

## 2023-01-06 DIAGNOSIS — I517 Cardiomegaly: Secondary | ICD-10-CM | POA: Diagnosis not present

## 2023-01-06 DIAGNOSIS — Z87891 Personal history of nicotine dependence: Secondary | ICD-10-CM | POA: Insufficient documentation

## 2023-01-06 DIAGNOSIS — I429 Cardiomyopathy, unspecified: Secondary | ICD-10-CM

## 2023-01-06 DIAGNOSIS — I5022 Chronic systolic (congestive) heart failure: Secondary | ICD-10-CM | POA: Diagnosis not present

## 2023-01-06 DIAGNOSIS — Z7901 Long term (current) use of anticoagulants: Secondary | ICD-10-CM | POA: Insufficient documentation

## 2023-01-06 HISTORY — PX: ICD IMPLANT: EP1208

## 2023-01-06 LAB — GLUCOSE, CAPILLARY: Glucose-Capillary: 180 mg/dL — ABNORMAL HIGH (ref 70–99)

## 2023-01-06 SURGERY — ICD IMPLANT

## 2023-01-06 MED ORDER — FENTANYL CITRATE (PF) 100 MCG/2ML IJ SOLN
INTRAMUSCULAR | Status: AC
  Filled 2023-01-06: qty 2

## 2023-01-06 MED ORDER — MIDAZOLAM HCL 5 MG/5ML IJ SOLN
INTRAMUSCULAR | Status: AC
  Filled 2023-01-06: qty 5

## 2023-01-06 MED ORDER — SODIUM CHLORIDE 0.9 % IV SOLN: INTRAVENOUS | Status: DC

## 2023-01-06 MED ORDER — SODIUM CHLORIDE 0.9 % IV SOLN
INTRAVENOUS | Status: AC
  Filled 2023-01-06: qty 2

## 2023-01-06 MED ORDER — POVIDONE-IODINE 10 % EX SWAB
2.0000 | Freq: Once | CUTANEOUS | Status: AC
  Administered 2023-01-06: 2 via TOPICAL

## 2023-01-06 MED ORDER — LIDOCAINE HCL 1 % IJ SOLN
INTRAMUSCULAR | Status: AC
  Filled 2023-01-06: qty 60

## 2023-01-06 MED ORDER — MIDAZOLAM HCL 5 MG/5ML IJ SOLN
INTRAMUSCULAR | Status: DC | PRN
  Administered 2023-01-06: 1 mg via INTRAVENOUS
  Administered 2023-01-06 (×3): 2 mg via INTRAVENOUS
  Administered 2023-01-06 (×2): 1 mg via INTRAVENOUS

## 2023-01-06 MED ORDER — ONDANSETRON HCL 4 MG/2ML IJ SOLN: 4.0000 mg | Freq: Four times a day (QID) | INTRAMUSCULAR | Status: DC | PRN

## 2023-01-06 MED ORDER — LIDOCAINE HCL (PF) 1 % IJ SOLN
INTRAMUSCULAR | Status: DC | PRN
  Administered 2023-01-06: 50 mg

## 2023-01-06 MED ORDER — CHLORHEXIDINE GLUCONATE 4 % EX LIQD: 4.0000 | Freq: Once | CUTANEOUS | Status: DC

## 2023-01-06 MED ORDER — CEFAZOLIN SODIUM-DEXTROSE 2-4 GM/100ML-% IV SOLN
2.0000 g | INTRAVENOUS | Status: AC
  Administered 2023-01-06: 2 g via INTRAVENOUS

## 2023-01-06 MED ORDER — FENTANYL CITRATE (PF) 100 MCG/2ML IJ SOLN
INTRAMUSCULAR | Status: DC | PRN
  Administered 2023-01-06 (×6): 25 ug via INTRAVENOUS

## 2023-01-06 MED ORDER — HEPARIN (PORCINE) IN NACL 1000-0.9 UT/500ML-% IV SOLN
INTRAVENOUS | Status: DC | PRN
  Administered 2023-01-06: 500 mL

## 2023-01-06 MED ORDER — ACETAMINOPHEN 325 MG PO TABS: 325.0000 mg | ORAL_TABLET | ORAL | Status: DC | PRN

## 2023-01-06 MED ORDER — SODIUM CHLORIDE 0.9 % IV SOLN
80.0000 mg | INTRAVENOUS | Status: AC
  Administered 2023-01-06: 80 mg

## 2023-01-06 MED ORDER — CEFAZOLIN SODIUM-DEXTROSE 2-4 GM/100ML-% IV SOLN
INTRAVENOUS | Status: AC
  Filled 2023-01-06: qty 100

## 2023-01-06 SURGICAL SUPPLY — 13 items
CABLE SURGICAL S-101-97-12 (CABLE) ×1 IMPLANT
CATH RIGHTSITE C315HIS02 (CATHETERS) IMPLANT
HEMOSTAT SURGICEL 2X4 FIBR (HEMOSTASIS) IMPLANT
ICD UNIFY ASURA CRT CD3357-40C (ICD Generator) IMPLANT
LEAD DURATA 7122-65CM (Lead) IMPLANT
LEAD SELECT SECURE 3830 383069 (Lead) IMPLANT
LEAD ULTIPACE 52 LPA1231/52 (Lead) IMPLANT
PAD DEFIB RADIO PHYSIO CONN (PAD) ×1 IMPLANT
SELECT SECURE 3830 383069 (Lead) ×1 IMPLANT
SHEATH 7FR PRELUDE SNAP 13 (SHEATH) IMPLANT
SLITTER 6232ADJ (MISCELLANEOUS) IMPLANT
TRAY PACEMAKER INSERTION (PACKS) ×1 IMPLANT
WIRE HI TORQ VERSACORE-J 145CM (WIRE) IMPLANT

## 2023-01-06 NOTE — Discharge Instructions (Signed)
After Your ICD (Implantable Cardiac Defibrillator)   You have a [employer] ICD  ACTIVITY Do not lift your arm above shoulder height for 1 week after your procedure. After 7 days, you may progress as below.  You should remove your sling 24 hours after your procedure, unless otherwise instructed by your provider.     Wednesday January 13, 2023  Thursday January 14, 2023 Friday January 15, 2023 Saturday January 16, 2023   Do not lift, push, pull, or carry anything over 10 pounds with the affected arm until 6 weeks (Wednesday February 17, 2023 ) after your procedure.   You may drive AFTER your wound check, unless you have been told otherwise by your provider.   Ask your healthcare provider when you can go back to work   INCISION/Dressing If you are on a blood thinner such as Coumadin, Xarelto, Eliquis, Plavix, or Pradaxa please confirm with your provider when this should be resumed. 01/09/23 Saturday   If large square, outer bandage is left in place, this can be removed 4:00 pm. Do not remove steri-strips or glue as below.   Monitor your defibrillator site for redness, swelling, and drainage. Call the device clinic at [phone] if you experience these symptoms or fever/chills.  If your incision is closed with Dermabond/Surgical glue. You may shower 1 day after your pacemaker implant and wash around the site with soap and water.    If you were discharged in a sling, please do not wear this during the day more than 48 hours after your surgery unless otherwise instructed. This may increase the risk of stiffness and soreness in your shoulder.   Avoid lotions, ointments, or perfumes over your incision until it is well-healed.  You may use a hot tub or a pool AFTER your wound check appointment if the incision is completely closed.  Your ICD is designed to protect you from life threatening heart rhythms. Because of this, you may receive a shock.   1 shock with no symptoms:  Call the  office during business hours. 1 shock with symptoms (chest pain, chest pressure, dizziness, lightheadedness, shortness of breath, overall feeling unwell):  Call 911. If you experience 2 or more shocks in 24 hours:  Call 911. If you receive a shock, you should not drive for 6 months per the [place] DMV IF you receive appropriate therapy from your ICD.   ICD Alerts:  Some alerts are vibratory and others beep. These are NOT emergencies. Please call our office to let us know. If this occurs at night or on weekends, it can wait until the next business day. Send a remote transmission.  If your device is capable of reading fluid status (for heart failure), you will be offered monthly monitoring to review this with you.   DEVICE MANAGEMENT Remote monitoring is used to monitor your ICD from home. This monitoring is scheduled every 91 days by our office. It allows [place] to keep an eye on the functioning of your device to ensure it is working properly. You will routinely see your Electrophysiologist annually (more often if necessary).   You should receive your ID card for your new device in 4-8 weeks. Keep this card with you at all times once received. Consider wearing a medical alert bracelet or necklace.  Your ICD  may be MRI compatible. This will be discussed at your next office visit/wound check.  You should avoid contact with strong electric or magnetic fields.   Do not use amateur (ham) radio equipment  or electric (arc) welding torches. MP3 player headphones with magnets should not be used. Some devices are safe to use if held at least 12 inches (30 cm) from your defibrillator. These include power tools, lawn mowers, and speakers. If you are unsure if something is safe to use, ask your health care provider.  When using your cell phone, hold it to the ear that is on the opposite side from the defibrillator. Do not leave your cell phone in a pocket over the defibrillator.  You may safely use electric blankets,  heating pads, computers, and microwave ovens.  Call the office right away if: You have chest pain. You feel more than one shock. You feel more short of breath than you have felt before. You feel more light-headed than you have felt before. Your incision starts to open up.  This information is not intended to replace advice given to you by your health care provider. Make sure you discuss any questions you have with your health care provider.

## 2023-01-06 NOTE — Progress Notes (Signed)
Pt placed on CPAP for procedure by RT per MD. Pt tolerating well at this time,vitals stable, RT will monitor.

## 2023-01-06 NOTE — H&P (Signed)
Patient Care Team: Janith Lima, MD as PCP - General (Internal Medicine) Lelon Perla, MD as PCP - Cardiology (Cardiology) Deboraha Sprang, MD as PCP - Electrophysiology (Cardiology)   HPI  Gregory Stevenson is a 47 y.o. 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    1/22 admitted with PEmbolism and found to have LVEF 30-35%  Mild DOE but able to climb stairs without difficulty   No edema  No recent followup with DrDB   DATE TEST EF    1/22 Echo   30-35 % RV dysfunction    5/22 Echo   30-35 %    8/22 CTA   CaScore 0  8/22 cMRI 20% LGE cw/Sarcoid  12/22 PET   Extensive active myocardial inflammation involving nearly the entire left ventricle  9/23 cMRI 29% Multifocal LGE     Date Cr K Hgb LFT  2/23 0.95 4.1     7/23     14.9   2/24 0.68 4.6 14.8 69      Records and Results Reviewed    Past Medical History:  Diagnosis Date   Alcoholic fatty liver    Alcoholism ([place])    sober since 10/2020   Anxiety    Chronic systolic CHF (congestive heart failure) (HCC)    HTN (hypertension)    OSA (obstructive sleep apnea)    on CPAP   Type 2 diabetes mellitus ([place])     No past surgical history on file.  Current Facility-Administered Medications  Medication Dose Route Frequency Provider Last Rate Last Admin   0.9 %  sodium chloride infusion   Intravenous Continuous Deboraha Sprang, MD 50 mL/hr at 01/06/23 0923 New Bag at 01/06/23 0923   ceFAZolin (ANCEF) IVPB 2g/100 mL premix  2 g Intravenous On Call Deboraha Sprang, MD       chlorhexidine (HIBICLENS) 4 % liquid 4 Application  4 Application Topical Once Deboraha Sprang, MD       gentamicin (GARAMYCIN) 80 mg in sodium chloride 0.9 % 500 mL irrigation  80 mg Irrigation On Call Deboraha Sprang, MD        No Known Allergies    Social History   Tobacco Use   Smoking status: Former    Years: 3.00    Types: Cigarettes   Smokeless tobacco: Never   Tobacco comments:     Patient quit 20 years ago  Substance Use Topics   Alcohol use: Yes    Alcohol/week: 3.0 standard drinks of alcohol    Types: 3 Standard drinks or equivalent per week    Comment: prior alcohol abuse; sober for 1 month   Drug use: Never     Family History  Problem Relation Age of Onset   Diabetes Mellitus II Maternal Grandfather    Pulmonary embolism Neg Hx    CAD Neg Hx      Current Meds  Medication Sig   apixaban (ELIQUIS) 2.5 MG TABS tablet Take 1 tablet (2.5 mg total) by mouth 2 (two) times daily.   Cyanocobalamin (B-12 PO) Take 1 tablet by mouth daily.   empagliflozin (JARDIANCE) 10 MG TABS tablet Take 1 tablet (10 mg total) by mouth daily before breakfast.   folic acid (FOLVITE) 1 MG tablet Take 1 tablet (1 mg total) by mouth daily.   methotrexate (RHEUMATREX) 2.5 MG tablet Take 8 tablets (20 mg total) by mouth once a week.   methotrexate (RHEUMATREX) 2.5 MG  tablet Take 8 tablets (20 mg total) by mouth once a week.   metoprolol succinate (TOPROL-XL) 100 MG 24 hr tablet Take 1.5 tablets (150 mg total) by mouth daily. Take with or immediately following a meal.   PARoxetine (PAXIL) 20 MG tablet Take 1 tablet (20 mg total) by mouth daily.   rosuvastatin (CRESTOR) 10 MG tablet Take 1 tablet (10 mg total) by mouth daily.   sacubitril-valsartan (ENTRESTO) 49-51 MG Take 1 tablet by mouth 2 (two) times daily.   spironolactone (ALDACTONE) 25 MG tablet Take 1/2 tablet (12.5 mg total) by mouth daily.     Review of Systems negative except from HPI and PMH  Physical Exam BP 124/82   Pulse 73   Temp 98.6 F (37 C) (Oral)   Resp 18   Ht 6' (1.829 m)   Wt 99.8 kg   SpO2 93%   BMI 29.84 kg/m  Well developed and well nourished in no acute distress HENT normal E scleral and icterus clear Neck Supple JVP flat; carotids brisk and full Clear to ausculation Regular rate and rhythm, no murmurs gallops or rub Soft with active bowel sounds No clubbing cyanosis  Edema Alert and  oriented, grossly normal motor and sensory function Skin Warm and Dry    Assessment and  Plan Cardiomyopathy-nonischemic-sarcoid   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  The patient expresses understanding  and is willing to proceed.    Will use LBBA pacing as primary option for resynchronization

## 2023-01-07 ENCOUNTER — Encounter (HOSPITAL_COMMUNITY): Payer: Self-pay | Admitting: Internal Medicine

## 2023-01-08 ENCOUNTER — Telehealth: Payer: Self-pay

## 2023-01-08 NOTE — Telephone Encounter (Signed)
Follow-up after same day discharge: Implant date: 01/06/23 MD: Virl Axe, MD Device: St. Jude  Location: L. Chest    Wound check visit: 01/20/23 90 day MD follow-up: 05/03/23  Remote Transmission received:[place] updated 01/06/23  Dressing/sling removed: NA  Confirm [place] restart on: Resume 01/09/23 morning,  Unable to leave voicemail, will send [employer]

## 2023-01-08 NOTE — Telephone Encounter (Signed)
-----   Message from Michael Andrew Tillery, PA-C sent at 01/06/2023  1:37 PM EST ----- Regarding: Same Day Discharge ICD 01/06/23 Dr. Klein     

## 2023-01-20 ENCOUNTER — Other Ambulatory Visit (HOSPITAL_COMMUNITY): Payer: Self-pay

## 2023-01-20 ENCOUNTER — Ambulatory Visit: Payer: 59 | Attending: Interventional Cardiology

## 2023-01-20 DIAGNOSIS — Z95810 Presence of automatic (implantable) cardiac defibrillator: Secondary | ICD-10-CM

## 2023-01-20 DIAGNOSIS — D8685 Sarcoid myocarditis: Secondary | ICD-10-CM

## 2023-01-20 DIAGNOSIS — I420 Dilated cardiomyopathy: Secondary | ICD-10-CM

## 2023-01-20 LAB — CUP PACEART INCLINIC DEVICE CHECK
Battery Remaining Longevity: 51 mo
Brady Statistic RA Percent Paced: 0.65 %
Brady Statistic RV Percent Paced: 98 %
Date Time Interrogation Session: 20240228103741
HighPow Impedance: 69.75 Ohm
Implantable Lead Connection Status: 753985
Implantable Lead Connection Status: 753985
Implantable Lead Connection Status: 753985
Implantable Lead Implant Date: 20240214
Implantable Lead Implant Date: 20240214
Implantable Lead Implant Date: 20240214
Implantable Lead Location: 753859
Implantable Lead Location: 753860
Implantable Lead Location: 753860
Implantable Lead Model: 3830
Implantable Lead Model: 7122
Implantable Pulse Generator Implant Date: 20240214
Lead Channel Impedance Value: 475 Ohm
Lead Channel Impedance Value: 587.5 Ohm
Lead Channel Impedance Value: 637.5 Ohm
Lead Channel Pacing Threshold Amplitude: 0.5 V
Lead Channel Pacing Threshold Amplitude: 0.5 V
Lead Channel Pacing Threshold Amplitude: 0.5 V
Lead Channel Pacing Threshold Amplitude: 0.5 V
Lead Channel Pacing Threshold Amplitude: 0.75 V
Lead Channel Pacing Threshold Amplitude: 0.75 V
Lead Channel Pacing Threshold Pulse Width: 0.5 ms
Lead Channel Pacing Threshold Pulse Width: 0.5 ms
Lead Channel Pacing Threshold Pulse Width: 0.5 ms
Lead Channel Pacing Threshold Pulse Width: 0.5 ms
Lead Channel Pacing Threshold Pulse Width: 0.5 ms
Lead Channel Pacing Threshold Pulse Width: 0.5 ms
Lead Channel Sensing Intrinsic Amplitude: 11.9 mV
Lead Channel Sensing Intrinsic Amplitude: 5 mV
Lead Channel Setting Pacing Amplitude: 3.5 V
Lead Channel Setting Pacing Amplitude: 3.5 V
Lead Channel Setting Pacing Amplitude: 3.5 V
Lead Channel Setting Pacing Pulse Width: 0.5 ms
Lead Channel Setting Pacing Pulse Width: 0.5 ms
Lead Channel Setting Sensing Sensitivity: 0.5 mV
Pulse Gen Serial Number: 5559500
Zone Setting Status: 755011

## 2023-01-20 MED ORDER — CEPHALEXIN 500 MG PO CAPS
500.0000 mg | ORAL_CAPSULE | Freq: Two times a day (BID) | ORAL | 0 refills | Status: AC
  Filled 2023-01-20: qty 10, 5d supply, fill #0

## 2023-01-20 NOTE — Patient Instructions (Addendum)
   After Your ICD (Implantable Cardiac Defibrillator)    Monitor your defibrillator site for redness, swelling, and drainage. Call the device clinic at [phone] if you experience these symptoms or fever/chills.  Your incision was closed with Steri-strips or staples:  You may shower 7 days after your procedure and wash your incision with soap and water. Avoid lotions, ointments, or perfumes over your incision until it is well-healed.  You do have some mild redness at incision site.  As a result and to protect you from any early development  of infection, we are sending you in an antibiotic.  Keflex 581m 1 tablet take twice daily with food.  This will be for a 5 day course.  Please continue to monitor the site and if any increase in redness or new s/s of infection, or if area is not improving, please give [place] call at the above device clinic number.   You may use a hot tub or a pool after your wound check appointment if the incision is completely closed.  Do not lift, push or pull greater than 10 pounds with the affected arm until 6 weeks after your procedure. Until AFTER March 27th.  There are no other restrictions in arm movement after your wound check appointment.  Your ICD is designed to protect you from life threatening heart rhythms. Because of this, you may receive a shock.   1 shock with no symptoms:  Call the office during business hours. 1 shock with symptoms (chest pain, chest pressure, dizziness, lightheadedness, shortness of breath, overall feeling unwell):  Call 911. If you experience 2 or more shocks in 24 hours:  Call 911. If you receive a shock, you should not drive.  [place] DMV - no driving for 6 months if you receive appropriate therapy from your ICD.   ICD Alerts:  Some alerts are vibratory and others beep. These are NOT emergencies. Please call our office to let [place]. If this occurs at night or on weekends, it can wait until the next business day. Send a remote  transmission.  If your device is capable of reading fluid status (for heart failure), you will be offered monthly monitoring to review this with you.   Remote monitoring is used to monitor your ICD from home. This monitoring is scheduled every 91 days by our office. It allows [place] keep an eye on the functioning of your device to ensure it is working properly. You will routinely see your Electrophysiologist annually (more often if necessary).

## 2023-01-20 NOTE — Progress Notes (Signed)
Wound check appointment/CRT-D device. Steri-strips removed.  Incision edges approximated, however, there is noted scabbing in incision line as well as small amount of erythema encircling wound that is warm to touch.  There is no noted drainage and patient denies any tenderness.  Dr. Lovena Le reviewed and ordered Keflex '500mg'$  1 po bid X 5 days.  patient informed of RX and wound care instructions, okay to shower and clean area gently with soap/water.  He will continue to monitor for infection and call our office if doesn't improve or worsens.   Normal device function.  BIVP effectively: 98%. Thresholds, sensing, and impedances consistent with implant measurements. Device programmed at 3.5V for extra safety margin until 3 month visit. Histogram distribution appropriate for patient and level of activity. No mode switches or ventricular arrhythmias noted. Patient educated about wound care, arm mobility, lifting restrictions, shock plan. ROV in 3 months with implanting physician.

## 2023-02-01 ENCOUNTER — Other Ambulatory Visit (HOSPITAL_COMMUNITY): Payer: Self-pay

## 2023-02-01 ENCOUNTER — Other Ambulatory Visit: Payer: Self-pay | Admitting: Internal Medicine

## 2023-02-01 ENCOUNTER — Other Ambulatory Visit (HOSPITAL_COMMUNITY): Payer: Self-pay | Admitting: Internal Medicine

## 2023-02-01 DIAGNOSIS — F3342 Major depressive disorder, recurrent, in full remission: Secondary | ICD-10-CM

## 2023-02-01 DIAGNOSIS — E785 Hyperlipidemia, unspecified: Secondary | ICD-10-CM

## 2023-02-01 MED ORDER — PAROXETINE HCL 20 MG PO TABS
20.0000 mg | ORAL_TABLET | Freq: Every day | ORAL | 0 refills | Status: DC
  Filled 2023-02-01: qty 30, 30d supply, fill #0
  Filled 2023-03-03: qty 30, 30d supply, fill #1
  Filled 2023-04-09: qty 30, 30d supply, fill #2

## 2023-02-01 MED ORDER — ROSUVASTATIN CALCIUM 10 MG PO TABS
10.0000 mg | ORAL_TABLET | Freq: Every day | ORAL | 0 refills | Status: DC
  Filled 2023-02-01: qty 30, 30d supply, fill #0
  Filled 2023-03-03: qty 30, 30d supply, fill #1
  Filled 2023-04-09: qty 30, 30d supply, fill #2

## 2023-02-01 MED ORDER — METOPROLOL SUCCINATE ER 100 MG PO TB24
150.0000 mg | ORAL_TABLET | Freq: Every day | ORAL | 6 refills | Status: DC
  Filled 2023-02-01: qty 45, 30d supply, fill #0
  Filled 2023-03-03: qty 45, 30d supply, fill #1
  Filled 2023-04-09: qty 45, 30d supply, fill #2
  Filled 2023-05-12: qty 45, 30d supply, fill #3
  Filled 2023-06-14: qty 45, 30d supply, fill #4
  Filled 2023-07-21: qty 45, 30d supply, fill #5
  Filled 2023-08-25: qty 45, 30d supply, fill #6

## 2023-02-02 ENCOUNTER — Other Ambulatory Visit (HOSPITAL_COMMUNITY): Payer: Self-pay

## 2023-02-02 ENCOUNTER — Other Ambulatory Visit: Payer: Self-pay

## 2023-02-15 ENCOUNTER — Telehealth (HOSPITAL_COMMUNITY): Payer: Self-pay | Admitting: Vascular Surgery

## 2023-02-15 NOTE — Telephone Encounter (Signed)
LVM to make f/u appt w/ db next ava

## 2023-03-03 ENCOUNTER — Other Ambulatory Visit (HOSPITAL_COMMUNITY): Payer: Self-pay

## 2023-04-05 NOTE — Progress Notes (Signed)
ADVANCED HF CLINIC NOTE  Referring Physician: Dr. Jens Som Primary Care: Etta Grandchild, MD   HPI:  Gregory Stevenson is a 47 y/o male with h/o ETOH (quit 2021), DM2, OSA, HTN referred by Dr. 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   He was admitted 1/22 with pulmonary embolus.  Echocardiogram at that time revealed ejection fraction 30 to 35%, mild left ventricular enlargement, mild right ventricular enlargement, mild RV dysfunction, moderate pulmonary hypertension, mild left atrial enlargement and mild mitral regurgitation. Patient was treated medically. LV dysfunction felt possibly related to ETOH use.  Echocardiogram May 2022: EF 30 to 35%.  CTA August 2022 showed calcium score 0, normal coronary arteries and mildly dilated pulmonary artery.    Cardiac MRI August 2022: LVEF 20%, RVEF 43%, LGE pattern suggestive of sarcoid   PET SCAN 10/24/21  1. Extensive active myocardial inflammation involving nearly the entire left ventricle. Inflammatory findings are most severe in the anterior and inferior walls, and spare the basal anterolateral/ inferolateral and apical inferior segments. The metabolic and perfusion findings above are compatible with cardiac sarcoidosis and cardiomyopathy in the appropriate clinical setting.   2. FDG-avid thoracic and upper abdominal lymphadenopathy, hypermetabolic foci in the liver and spleen, and several FDG avid lesions in a mid- thoracic vertebral body. Taken together these findings most likely represent extensive extra-cardiac sarcoidosis, although correlation with any prior contrast-enhanced cross-sectional imaging is suggested. If prior imaging is unavailable and if clinically warranted, further imaging could be obtained including a liver MRI with contrast and whole body bone scan.   Last seen for f/u 12/11/21. Started on prednisone + MTX for sarcoid. Volume appeared stable.  14 day Zio 01/19-02/01/23: Sinus rhythm with 25  runs NSVT up to 13 beats in duration, 1.6% PVCs, 5 beat run SVT, wenckebach AV block present  Completed prednisone in 7/23. On MTX 20mg  q wed.  S/p MDT CRT-D 2/24   Here for routine f/u. Feels pretty good. Very busy writing comic books. Not overly active. Has gained 10-15 pounds. No edema, orthopnea or PND. Can do activities without problem. On MTX 20 weekly. No ICD firings  ICD interrogation >98% biv pacing impedance ok. NO VT. Personally reviewed    Review of Systems: Cardiac and Respiratory - Negative except as mentioned in HPI  Past Medical History:  Diagnosis Date   Alcoholic fatty liver    Alcoholism (HCC)    sober since 10/2020   Anxiety    Chronic systolic CHF (congestive heart failure) (HCC)    HTN (hypertension)    OSA (obstructive sleep apnea)    on CPAP   Type 2 diabetes mellitus (HCC)     Current Outpatient Medications  Medication Sig Dispense Refill   apixaban (ELIQUIS) 2.5 MG TABS tablet Take 1 tablet (2.5 mg total) by mouth 2 (two) times daily. 60 tablet 6   Cyanocobalamin (B-12 PO) Take 1 tablet by mouth daily.     empagliflozin (JARDIANCE) 10 MG TABS tablet Take 1 tablet (10 mg total) by mouth daily before breakfast. 90 tablet 3   folic acid (FOLVITE) 1 MG tablet Take 1 tablet (1 mg total) by mouth daily. 30 tablet 3   methotrexate (RHEUMATREX) 2.5 MG tablet Take 8 tablets (20 mg total) by mouth once a week. 96 tablet 3   metoprolol succinate (TOPROL-XL) 100 MG 24 hr tablet Take 1.5 tablets (150 mg total) by mouth daily. Take with or immediately following a meal. 45 tablet 6   PARoxetine (PAXIL)  20 MG tablet Take 1 tablet (20 mg total) by mouth daily. 90 tablet 0   rosuvastatin (CRESTOR) 10 MG tablet Take 1 tablet (10 mg total) by mouth daily. 90 tablet 0   sacubitril-valsartan (ENTRESTO) 49-51 MG Take 1 tablet by mouth 2 (two) times daily. 60 tablet 11   spironolactone (ALDACTONE) 25 MG tablet Take 1/2 tablet (12.5 mg total) by mouth daily. 45 tablet 3   No  current facility-administered medications for this encounter.    No Known Allergies    Social History   Socioeconomic History   Marital status: Divorced    Spouse name: Not on file   Number of children: Not on file   Years of education: Not on file   Highest education level: Not on file  Occupational History   Not on file  Tobacco Use   Smoking status: Former    Years: 3    Types: Cigarettes   Smokeless tobacco: Never   Tobacco comments:    Patient quit 20 years ago  Substance and Sexual Activity   Alcohol use: Yes    Alcohol/week: 3.0 standard drinks of alcohol    Types: 3 Standard drinks or equivalent per week    Comment: prior alcohol abuse; sober for 1 month   Drug use: Never   Sexual activity: Not Currently  Other Topics Concern   Not on file  Social History Narrative   Not on file   Social Determinants of Health   Financial Resource Strain: Not on file  Food Insecurity: Not on file  Transportation Needs: Not on file  Physical Activity: Not on file  Stress: Not on file  Social Connections: Not on file  Intimate Partner Violence: Not on file      Family History  Problem Relation Age of Onset   Diabetes Mellitus II Maternal Grandfather    Pulmonary embolism Neg Hx    CAD Neg Hx     Vitals:   04/06/23 1414  BP: 124/80  Pulse: 82  SpO2: 94%  Weight: 104.2 kg (229 lb 12.8 oz)     PHYSICAL EXAM: General:  Well appearing. No resp difficulty HEENT: normal Neck: supple. no JVD. Carotids 2+ bilat; no bruits. No lymphadenopathy or thryomegaly appreciated. Cor: PMI nondisplaced. Regular rate & rhythm. No rubs, gallops or murmurs. Lungs: clear Abdomen: obese soft, nontender, nondistended. No hepatosplenomegaly. No bruits or masses. Good bowel sounds. Extremities: no cyanosis, clubbing, rash, edema Neuro: alert & orientedx3, cranial nerves grossly intact. moves all 4 extremities w/o difficulty. Affect pleasant   ASSESSMENT & PLAN:   1. 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 On MTX 20mg  q wed. - s/p MDT CRT-D 2/24  - ICD interrogated in clinic today - Doing well NYHA I - Volume status ok. Lasix prn onlty - Increase Entresto to 97/103 mg BID - Continue spiro 12.5 daily - Continue toprol150 mg daily - Continue Farxiga 10 daily - Labs today - Reassess EF and sarcoid activity with PET  2. Cardiac sarcoidosis - cMRI 8/22 EF 20% RVEF 43%  LGE c/w sarcoid - PET 12/22 c/w diffuse active sarcoid EF 28% - Has RBBB on ECG. 14 day zio w/ 25 runs NSVT, 1 SVT, 1.6% PVCs, wenckebach AV block - Completed prednisone in 7/23. On MTX 20mg  q wed. -  s/p MDT CRT-D 2/24  - Labs today - Needs f/u PET - Refer to Rheum to discuss other long-term anti-inflammatory agents  3. DM2 - Continue Farxiga  4. ETOH abuse - has cut back. Limit as much s possible  5. PE - continue apixaban - On Eliquis 2.5 BID based on Amplify-EXT trial  6. OSA - On CPAP   Arvilla Meres, MD  2:46 PM

## 2023-04-06 ENCOUNTER — Ambulatory Visit (HOSPITAL_COMMUNITY)
Admission: RE | Admit: 2023-04-06 | Discharge: 2023-04-06 | Disposition: A | Discharge status: Home / Self Care | Payer: 59 | Source: Ambulatory Visit | Attending: Internal Medicine | Admitting: Internal Medicine

## 2023-04-06 ENCOUNTER — Other Ambulatory Visit (HOSPITAL_COMMUNITY): Payer: Self-pay

## 2023-04-06 ENCOUNTER — Encounter (HOSPITAL_COMMUNITY): Payer: Self-pay | Admitting: Internal Medicine

## 2023-04-06 ENCOUNTER — Telehealth (HOSPITAL_COMMUNITY): Payer: Self-pay

## 2023-04-06 VITALS — BP 124/80 | HR 82 | Wt 229.8 lb

## 2023-04-06 DIAGNOSIS — I4510 Unspecified right bundle-branch block: Secondary | ICD-10-CM | POA: Insufficient documentation

## 2023-04-06 DIAGNOSIS — Z79899 Other long term (current) drug therapy: Secondary | ICD-10-CM | POA: Insufficient documentation

## 2023-04-06 DIAGNOSIS — I272 Pulmonary hypertension, unspecified: Secondary | ICD-10-CM | POA: Diagnosis not present

## 2023-04-06 DIAGNOSIS — Z87891 Personal history of nicotine dependence: Secondary | ICD-10-CM | POA: Diagnosis not present

## 2023-04-06 DIAGNOSIS — E119 Type 2 diabetes mellitus without complications: Secondary | ICD-10-CM | POA: Diagnosis not present

## 2023-04-06 DIAGNOSIS — I110 Hypertensive heart disease with heart failure: Secondary | ICD-10-CM | POA: Insufficient documentation

## 2023-04-06 DIAGNOSIS — I2699 Other pulmonary embolism without acute cor pulmonale: Secondary | ICD-10-CM | POA: Diagnosis not present

## 2023-04-06 DIAGNOSIS — Z7901 Long term (current) use of anticoagulants: Secondary | ICD-10-CM | POA: Insufficient documentation

## 2023-04-06 DIAGNOSIS — Z86711 Personal history of pulmonary embolism: Secondary | ICD-10-CM | POA: Insufficient documentation

## 2023-04-06 DIAGNOSIS — I5022 Chronic systolic (congestive) heart failure: Secondary | ICD-10-CM | POA: Diagnosis not present

## 2023-04-06 DIAGNOSIS — Z7984 Long term (current) use of oral hypoglycemic drugs: Secondary | ICD-10-CM | POA: Insufficient documentation

## 2023-04-06 DIAGNOSIS — D869 Sarcoidosis, unspecified: Secondary | ICD-10-CM | POA: Diagnosis not present

## 2023-04-06 DIAGNOSIS — F1010 Alcohol abuse, uncomplicated: Secondary | ICD-10-CM | POA: Insufficient documentation

## 2023-04-06 DIAGNOSIS — Z4502 Encounter for adjustment and management of automatic implantable cardiac defibrillator: Secondary | ICD-10-CM | POA: Insufficient documentation

## 2023-04-06 DIAGNOSIS — G4733 Obstructive sleep apnea (adult) (pediatric): Secondary | ICD-10-CM | POA: Diagnosis not present

## 2023-04-06 DIAGNOSIS — F419 Anxiety disorder, unspecified: Secondary | ICD-10-CM | POA: Insufficient documentation

## 2023-04-06 DIAGNOSIS — Z8249 Family history of ischemic heart disease and other diseases of the circulatory system: Secondary | ICD-10-CM | POA: Diagnosis not present

## 2023-04-06 DIAGNOSIS — D8685 Sarcoid myocarditis: Secondary | ICD-10-CM

## 2023-04-06 LAB — CBC
HCT: 43.5 % (ref 39.0–52.0)
Hemoglobin: 14.9 g/dL (ref 13.0–17.0)
MCH: 33.9 pg (ref 26.0–34.0)
MCHC: 34.3 g/dL (ref 30.0–36.0)
MCV: 98.9 fL (ref 80.0–100.0)
Platelets: 159 10*3/uL (ref 150–400)
RBC: 4.4 MIL/uL (ref 4.22–5.81)
RDW: 12.9 % (ref 11.5–15.5)
WBC: 4 10*3/uL (ref 4.0–10.5)
nRBC: 0 % (ref 0.0–0.2)

## 2023-04-06 LAB — BRAIN NATRIURETIC PEPTIDE: B Natriuretic Peptide: 65.4 pg/mL (ref 0.0–100.0)

## 2023-04-06 LAB — COMPREHENSIVE METABOLIC PANEL
ALT: 118 U/L — ABNORMAL HIGH (ref 0–44)
AST: 102 U/L — ABNORMAL HIGH (ref 15–41)
Albumin: 4.3 g/dL (ref 3.5–5.0)
Alkaline Phosphatase: 75 U/L (ref 38–126)
Anion gap: 12 (ref 5–15)
BUN: 11 mg/dL (ref 6–20)
CO2: 23 mmol/L (ref 22–32)
Calcium: 9.4 mg/dL (ref 8.9–10.3)
Chloride: 103 mmol/L (ref 98–111)
Creatinine, Ser: 0.78 mg/dL (ref 0.61–1.24)
GFR, Estimated: >60 mL/min (ref >60)
Glucose, Bld: 183 mg/dL — ABNORMAL HIGH (ref 70–99)
Potassium: 4.1 mmol/L (ref 3.5–5.1)
Sodium: 138 mmol/L (ref 135–145)
Total Bilirubin: 0.8 mg/dL (ref 0.3–1.2)
Total Protein: 7.3 g/dL (ref 6.5–8.1)

## 2023-04-06 MED ORDER — ENTRESTO 97-103 MG PO TABS
1.0000 | ORAL_TABLET | Freq: Two times a day (BID) | ORAL | 11 refills | Status: DC
  Filled 2023-04-06: qty 60, 30d supply, fill #0
  Filled 2023-05-12: qty 60, 30d supply, fill #1
  Filled 2023-06-14: qty 60, 30d supply, fill #2
  Filled 2023-07-21: qty 60, 30d supply, fill #3
  Filled 2023-08-25: qty 60, 30d supply, fill #4
  Filled 2023-09-27: qty 60, 30d supply, fill #5
  Filled 2023-10-28: qty 60, 30d supply, fill #6
  Filled 2023-12-06: qty 60, 30d supply, fill #7
  Filled 2024-01-10: qty 60, 30d supply, fill #8
  Filled 2024-03-23: qty 60, 30d supply, fill #9

## 2023-04-06 NOTE — Patient Instructions (Signed)
Good to see you today!  INCREASE Entresto 97/103 mg  Twice daily  How to Prepare for Your Cardiac PET/CT Stress Test:  1. Please do not take these medications before your test:   Medications that may interfere with the cardiac pharmacological stress agent (ex. nitrates - including erectile dysfunction medications, isosorbide mononitrate, tamulosin or beta-blockers) the day of the exam. (Erectile dysfunction medication should be held for at least 72 hrs prior to test) Theophylline containing medications for 12 hours. Dipyridamole 48 hours prior to the test. Your remaining medications may be taken with water.  2. Nothing to eat or drink, except water, 3 hours prior to arrival time.   NO caffeine/decaffeinated products, or chocolate 12 hours prior to arrival.  3. NO perfume, cologne or lotion  4. Total time is 1 to 2 hours; you may want to bring reading material for the waiting time.  5. Please report to Radiology at the [facility] Main Entrance 30 minutes early for your test.  [address] [place], [place] [address]  Diabetic Preparation:  Hold oral medications. You may take NPH and Lantus insulin. Do not take Humalog or Humulin R (Regular Insulin) the day of your test. Check blood sugars prior to leaving the house. If able to eat breakfast prior to 3 hour fasting, you may take all medications, including your insulin, Do not worry if you miss your breakfast dose of insulin - start at your next meal.  IF YOU THINK YOU MAY BE PREGNANT, OR ARE NURSING PLEASE INFORM THE TECHNOLOGIST.  In preparation for your appointment, medication and supplies will be purchased.  Appointment availability is limited, so if you need to cancel or reschedule, please call the Radiology Department at [phone]  24 hours in advance to avoid a cancellation fee of $100.00  What to Expect After you Arrive:  Once you arrive and check in for your appointment, you will be taken to a preparation  room within the Radiology Department.  A technologist or Nurse will obtain your medical history, verify that you are correctly prepped for the exam, and explain the procedure.  Afterwards,  an IV will be started in your arm and electrodes will be placed on your skin for EKG monitoring during the stress portion of the exam. Then you will be escorted to the PET/CT scanner.  There, staff will get you positioned on the scanner and obtain a blood pressure and EKG.  During the exam, you will continue to be connected to the EKG and blood pressure machines.  A small, safe amount of a radioactive tracer will be injected in your IV to obtain a series of pictures of your heart along with an injection of a stress agent.    After your Exam:  It is recommended that you eat a meal and drink a caffeinated beverage to counter act any effects of the stress agent.  Drink plenty of fluids for the remainder of the day and urinate frequently for the first couple of hours after the exam.  Your doctor will inform you of your test results within 7-10 business days.  For questions about your test or how to prepare for your test, please call: Rockwell Alexandria, Cardiac Imaging Nurse Navigator  Larey Brick, Cardiac Imaging Nurse Navigator Office: [phone]  You have been referred to Rheumatology ( Dr. Dierdre Forth) They will call to schedule an appointment  Your physician recommends that you schedule a follow-up appointment in: 6 months(November) Call office in September to schedule an  appointment  If you have any questions or concerns before your next appointment please send [place] a message through [place] or call our office at [phone].    TO LEAVE A MESSAGE FOR THE NURSE SELECT OPTION 2, PLEASE LEAVE A MESSAGE INCLUDING: YOUR NAME DATE OF BIRTH CALL BACK NUMBER REASON FOR CALL**this is important as we prioritize the call backs  YOU WILL RECEIVE A CALL BACK THE SAME DAY AS LONG AS YOU CALL BEFORE 4:00 PM At the [facility], you and your health needs are our priority. As part of our continuing mission to provide you with exceptional heart care, we have created designated Provider Care Teams. These Care Teams include your primary Cardiologist (physician) and Advanced Practice Providers (APPs- Physician Assistants and Nurse Practitioners) who all work together to provide you with the care you need, when you need it.   You may see any of the following providers on your designated Care Team at your next follow up: Dr Arvilla Meres Dr Marca Ancona Dr. Marcos Eke, NP Robbie Lis, [place] [facility] [place], [place] Brynda Peon, NP Karle Plumber, PharmD   Please be sure to bring in all your medications bottles to every appointment.    Thank you for choosing [facility]

## 2023-04-06 NOTE — Telephone Encounter (Signed)
Referral faxed to [facility]. 

## 2023-04-09 ENCOUNTER — Other Ambulatory Visit: Payer: Self-pay

## 2023-04-09 ENCOUNTER — Ambulatory Visit (INDEPENDENT_AMBULATORY_CARE_PROVIDER_SITE_OTHER): Payer: 59

## 2023-04-09 ENCOUNTER — Other Ambulatory Visit (HOSPITAL_COMMUNITY): Payer: Self-pay

## 2023-04-09 ENCOUNTER — Other Ambulatory Visit (HOSPITAL_COMMUNITY): Payer: Self-pay | Admitting: Internal Medicine

## 2023-04-09 DIAGNOSIS — I420 Dilated cardiomyopathy: Secondary | ICD-10-CM

## 2023-04-09 LAB — CUP PACEART REMOTE DEVICE CHECK
Battery Remaining Longevity: 49 mo
Battery Remaining Percentage: 91 %
Battery Voltage: 3.11 V
Brady Statistic AP VP Percent: 1.6 %
Brady Statistic AP VS Percent: 1 %
Brady Statistic AS VP Percent: 97 %
Brady Statistic AS VS Percent: 1 %
Brady Statistic RA Percent Paced: 1 %
Date Time Interrogation Session: 20240517020030
HighPow Impedance: 79 Ohm
HighPow Impedance: 79 Ohm
Implantable Lead Connection Status: 753985
Implantable Lead Connection Status: 753985
Implantable Lead Connection Status: 753985
Implantable Lead Implant Date: 20240214
Implantable Lead Implant Date: 20240214
Implantable Lead Implant Date: 20240214
Implantable Lead Location: 753859
Implantable Lead Location: 753860
Implantable Lead Location: 753860
Implantable Lead Model: 3830
Implantable Lead Model: 7122
Implantable Pulse Generator Implant Date: 20240214
Lead Channel Impedance Value: 450 Ohm
Lead Channel Impedance Value: 550 Ohm
Lead Channel Impedance Value: 550 Ohm
Lead Channel Pacing Threshold Amplitude: 0.5 V
Lead Channel Pacing Threshold Amplitude: 0.5 V
Lead Channel Pacing Threshold Amplitude: 0.75 V
Lead Channel Pacing Threshold Pulse Width: 0.5 ms
Lead Channel Pacing Threshold Pulse Width: 0.5 ms
Lead Channel Pacing Threshold Pulse Width: 0.5 ms
Lead Channel Sensing Intrinsic Amplitude: 12 mV
Lead Channel Sensing Intrinsic Amplitude: 5 mV
Lead Channel Setting Pacing Amplitude: 3.5 V
Lead Channel Setting Pacing Amplitude: 3.5 V
Lead Channel Setting Pacing Amplitude: 3.5 V
Lead Channel Setting Pacing Pulse Width: 0.5 ms
Lead Channel Setting Pacing Pulse Width: 0.5 ms
Lead Channel Setting Sensing Sensitivity: 0.5 mV
Pulse Gen Serial Number: 5559500
Zone Setting Status: 755011

## 2023-04-09 MED ORDER — APIXABAN 2.5 MG PO TABS
2.5000 mg | ORAL_TABLET | Freq: Two times a day (BID) | ORAL | 6 refills | Status: DC
  Filled 2023-04-09: qty 60, 30d supply, fill #0
  Filled 2023-05-12: qty 60, 30d supply, fill #1
  Filled 2023-06-14: qty 60, 30d supply, fill #2
  Filled 2023-07-21: qty 60, 30d supply, fill #3
  Filled 2023-08-25: qty 60, 30d supply, fill #4
  Filled 2023-09-27: qty 60, 30d supply, fill #5
  Filled 2023-10-28: qty 60, 30d supply, fill #6

## 2023-04-09 MED ORDER — METHOTREXATE SODIUM 2.5 MG PO TABS
20.0000 mg | ORAL_TABLET | ORAL | 1 refills | Status: DC
  Filled 2023-04-09: qty 32, 28d supply, fill #0
  Filled 2023-05-12: qty 32, 28d supply, fill #1
  Filled 2023-06-14: qty 32, 28d supply, fill #2
  Filled 2023-07-21: qty 32, 28d supply, fill #3
  Filled 2023-08-25: qty 32, 28d supply, fill #4
  Filled 2023-09-27: qty 32, 28d supply, fill #5

## 2023-04-12 ENCOUNTER — Other Ambulatory Visit (HOSPITAL_COMMUNITY): Payer: Self-pay

## 2023-04-21 NOTE — Progress Notes (Signed)
Remote ICD transmission.   

## 2023-05-03 ENCOUNTER — Encounter: Payer: Self-pay | Admitting: Internal Medicine

## 2023-05-03 ENCOUNTER — Ambulatory Visit: Payer: 59 | Attending: Internal Medicine | Admitting: Internal Medicine

## 2023-05-03 VITALS — BP 116/84 | HR 81 | Ht 72.0 in | Wt 230.0 lb

## 2023-05-03 DIAGNOSIS — I5022 Chronic systolic (congestive) heart failure: Secondary | ICD-10-CM

## 2023-05-03 DIAGNOSIS — Z95810 Presence of automatic (implantable) cardiac defibrillator: Secondary | ICD-10-CM

## 2023-05-03 LAB — CUP PACEART INCLINIC DEVICE CHECK
Battery Remaining Longevity: 73 mo
Brady Statistic RA Percent Paced: 0.6 %
Brady Statistic RV Percent Paced: 98 %
Date Time Interrogation Session: 20240610165521
HighPow Impedance: 94.5 Ohm
Implantable Lead Connection Status: 753985
Implantable Lead Connection Status: 753985
Implantable Lead Connection Status: 753985
Implantable Lead Implant Date: 20240214
Implantable Lead Implant Date: 20240214
Implantable Lead Implant Date: 20240214
Implantable Lead Location: 753859
Implantable Lead Location: 753860
Implantable Lead Location: 753860
Implantable Lead Model: 3830
Implantable Lead Model: 7122
Implantable Pulse Generator Implant Date: 20240214
Lead Channel Impedance Value: 487.5 Ohm
Lead Channel Impedance Value: 587.5 Ohm
Lead Channel Impedance Value: 612.5 Ohm
Lead Channel Pacing Threshold Amplitude: 0.5 V
Lead Channel Pacing Threshold Amplitude: 0.5 V
Lead Channel Pacing Threshold Amplitude: 0.75 V
Lead Channel Pacing Threshold Amplitude: 0.75 V
Lead Channel Pacing Threshold Amplitude: 1 V
Lead Channel Pacing Threshold Amplitude: 1 V
Lead Channel Pacing Threshold Pulse Width: 0.5 ms
Lead Channel Pacing Threshold Pulse Width: 0.5 ms
Lead Channel Pacing Threshold Pulse Width: 0.5 ms
Lead Channel Pacing Threshold Pulse Width: 0.5 ms
Lead Channel Pacing Threshold Pulse Width: 0.5 ms
Lead Channel Pacing Threshold Pulse Width: 0.5 ms
Lead Channel Sensing Intrinsic Amplitude: 12 mV
Lead Channel Sensing Intrinsic Amplitude: 3 mV
Lead Channel Setting Pacing Amplitude: 2 V
Lead Channel Setting Pacing Amplitude: 2.5 V
Lead Channel Setting Pacing Amplitude: 2.5 V
Lead Channel Setting Pacing Pulse Width: 0.5 ms
Lead Channel Setting Pacing Pulse Width: 0.5 ms
Lead Channel Setting Sensing Sensitivity: 0.5 mV
Pulse Gen Serial Number: 5559500
Zone Setting Status: 755011

## 2023-05-03 NOTE — Progress Notes (Signed)
Patient Care Team: Etta Grandchild, MD as PCP - General (Internal Medicine) Jens Som Madolyn Frieze, MD as PCP - Cardiology (Cardiology) Duke Salvia, MD as PCP - Electrophysiology (Cardiology)   HPI  Gregory Stevenson is a 47 y.o. male seen in follow-up for CRT- ICD [place] Jude-DOI 2/24) for sarcoid cardiomyopathy and persistent LV dysfunction    1/22 admitted with PEmbolism and found to have LVEF 30-35%  No significant change in functional status following CRT implant.  Feels well.  Repeat PET scan pending.   Denies chest pain or shortness of breath but not very active.   DATE TEST EF    1/22 Echo   30-35 % RV dysfunction    5/22 Echo   30-35 %    8/22 CTA   CaScore 0  8/22 cMRI 20% LGE cw/Sarcoid  12/22 PET   Extensive active myocardial inflammation involving nearly the entire left ventricle  9/23 cMRI 29% Multifocal LGE     Date Cr K Hgb LFT  2/23 0.95 4.1     7/23     14.9   2/24 0.68 4.6 14.8 69  5/24 0.78 4.1 14.9 118      Records and Results Reviewed    Past Medical History:  Diagnosis Date   Alcoholic fatty liver    Alcoholism (HCC)    sober since 10/2020   Anxiety    Chronic systolic CHF (congestive heart failure) (HCC)    HTN (hypertension)    OSA (obstructive sleep apnea)    on CPAP   Type 2 diabetes mellitus (HCC)     Past Surgical History:  Procedure Laterality Date   ICD IMPLANT N/A 01/06/2023   Procedure: ICD IMPLANT;  Surgeon: Duke Salvia, MD;  Location: [facility] INVASIVE CV LAB;  Service: Cardiovascular;  Laterality: N/A;    Current Outpatient Medications  Medication Sig Dispense Refill   apixaban (ELIQUIS) 2.5 MG TABS tablet Take 1 tablet (2.5 mg total) by mouth 2 (two) times daily. 60 tablet 6   Cyanocobalamin (B-12 PO) Take 1 tablet by mouth daily.     empagliflozin (JARDIANCE) 10 MG TABS tablet Take 1 tablet (10 mg total) by mouth daily before breakfast. 90 tablet 3   folic acid (FOLVITE) 1 MG tablet Take 1 tablet (1 mg total) by mouth  daily. 30 tablet 3   methotrexate (RHEUMATREX) 2.5 MG tablet Take 8 tablets (20 mg total) by mouth once a week. 96 tablet 1   metoprolol succinate (TOPROL-XL) 100 MG 24 hr tablet Take 1.5 tablets (150 mg total) by mouth daily. Take with or immediately following a meal. 45 tablet 6   PARoxetine (PAXIL) 20 MG tablet Take 1 tablet (20 mg total) by mouth daily. 90 tablet 0   rosuvastatin (CRESTOR) 10 MG tablet Take 1 tablet (10 mg total) by mouth daily. 90 tablet 0   sacubitril-valsartan (ENTRESTO) 97-103 MG Take 1 tablet by mouth 2 (two) times daily. 60 tablet 11   spironolactone (ALDACTONE) 25 MG tablet Take 1/2 tablet (12.5 mg total) by mouth daily. 45 tablet 3   No current facility-administered medications for this visit.    No Known Allergies        Current Meds  Medication Sig   apixaban (ELIQUIS) 2.5 MG TABS tablet Take 1 tablet (2.5 mg total) by mouth 2 (two) times daily.   Cyanocobalamin (B-12 PO) Take 1 tablet by mouth daily.   empagliflozin (JARDIANCE) 10 MG TABS tablet Take 1 tablet (10 mg  total) by mouth daily before breakfast.   folic acid (FOLVITE) 1 MG tablet Take 1 tablet (1 mg total) by mouth daily.   methotrexate (RHEUMATREX) 2.5 MG tablet Take 8 tablets (20 mg total) by mouth once a week.   metoprolol succinate (TOPROL-XL) 100 MG 24 hr tablet Take 1.5 tablets (150 mg total) by mouth daily. Take with or immediately following a meal.   PARoxetine (PAXIL) 20 MG tablet Take 1 tablet (20 mg total) by mouth daily.   rosuvastatin (CRESTOR) 10 MG tablet Take 1 tablet (10 mg total) by mouth daily.   sacubitril-valsartan (ENTRESTO) 97-103 MG Take 1 tablet by mouth 2 (two) times daily.   spironolactone (ALDACTONE) 25 MG tablet Take 1/2 tablet (12.5 mg total) by mouth daily.   [DISCONTINUED] methotrexate (RHEUMATREX) 2.5 MG tablet Take 8 tablets (20 mg total) by mouth once a week.     Review of Systems negative except from HPI and PMH  Physical Exam BP 116/84   Pulse 81   Ht  6' (1.829 m)   Wt 230 lb (104.3 kg)   SpO2 96%   BMI 31.19 kg/m  Well developed and well nourished in no acute distress HENT normal Neck supple with JVP-flat Clear Device pocket well healed; without hematoma or erythema.  There is no tethering  Regular rate and rhythm, no murmur Abd-soft with active BS No Clubbing cyanosis  edema Skin-warm and dry A & Oriented  Grossly normal sensory and motor function  ECG sinus with P synchronous pacing with a QRSd 128 ms Preimplant QRSd 164 ms  Device function is normal. 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  Medications per Dr. Dorthea Cove.  Continue Eliquis for pulmonary embolism

## 2023-05-03 NOTE — Patient Instructions (Signed)
Medication Instructions:  Your physician recommends that you continue on your current medications as directed. Please refer to the Current Medication list given to you today.  *If you need a refill on your cardiac medications before your next appointment, please call your pharmacy*   Lab Work: None ordered.  If you have labs (blood work) drawn today and your tests are completely normal, you will receive your results only by: MyChart Message (if you have MyChart) OR A paper copy in the mail If you have any lab test that is abnormal or we need to change your treatment, we will call you to review the results.   Testing/Procedures: None ordered.    Follow-Up: At [facility], you and your health needs are our priority.  As part of our continuing mission to provide you with exceptional heart care, we have created designated Provider Care Teams.  These Care Teams include your primary Cardiologist (physician) and Advanced Practice Providers (APPs -  Physician Assistants and Nurse Practitioners) who all work together to provide you with the care you need, when you need it.  We recommend signing up for the patient portal called "MyChart".  Sign up information is provided on this After Visit Summary.  MyChart is used to connect with patients for Virtual Visits (Telemedicine).  Patients are able to view lab/test results, encounter notes, upcoming appointments, etc.  Non-urgent messages can be sent to your provider as well.   To learn more about what you can do with MyChart, go to [email or web address].    Your next appointment:   9 months with Dr Klein 

## 2023-05-04 NOTE — Addendum Note (Signed)
Addended by: Mariam Dollar on: 05/04/2023 11:32 AM   Modules accepted: Orders

## 2023-05-12 ENCOUNTER — Other Ambulatory Visit: Payer: Self-pay

## 2023-05-12 ENCOUNTER — Other Ambulatory Visit (HOSPITAL_COMMUNITY): Payer: Self-pay

## 2023-05-12 ENCOUNTER — Other Ambulatory Visit: Payer: Self-pay | Admitting: Internal Medicine

## 2023-05-12 DIAGNOSIS — E785 Hyperlipidemia, unspecified: Secondary | ICD-10-CM

## 2023-05-12 DIAGNOSIS — F3342 Major depressive disorder, recurrent, in full remission: Secondary | ICD-10-CM

## 2023-05-12 MED ORDER — PAROXETINE HCL 20 MG PO TABS
20.0000 mg | ORAL_TABLET | Freq: Every day | ORAL | 0 refills | Status: AC
Start: 2023-05-12 — End: ?
  Filled 2023-05-12: qty 30, 30d supply, fill #0
  Filled 2023-06-14: qty 30, 30d supply, fill #1
  Filled 2023-07-21: qty 30, 30d supply, fill #2

## 2023-05-12 MED ORDER — ROSUVASTATIN CALCIUM 10 MG PO TABS
10.0000 mg | ORAL_TABLET | Freq: Every day | ORAL | 0 refills | Status: AC
Start: 2023-05-12 — End: ?
  Filled 2023-05-12: qty 30, 30d supply, fill #0
  Filled 2023-06-14: qty 30, 30d supply, fill #1
  Filled 2023-07-21: qty 30, 30d supply, fill #2

## 2023-06-14 ENCOUNTER — Other Ambulatory Visit: Payer: Self-pay

## 2023-06-14 ENCOUNTER — Other Ambulatory Visit (HOSPITAL_COMMUNITY): Payer: Self-pay

## 2023-06-15 ENCOUNTER — Other Ambulatory Visit (HOSPITAL_COMMUNITY): Payer: Self-pay

## 2023-06-15 ENCOUNTER — Other Ambulatory Visit: Payer: Self-pay

## 2023-06-16 ENCOUNTER — Other Ambulatory Visit (HOSPITAL_COMMUNITY): Payer: Self-pay

## 2023-07-05 DIAGNOSIS — Z6831 Body mass index (BMI) 31.0-31.9, adult: Secondary | ICD-10-CM | POA: Diagnosis not present

## 2023-07-05 DIAGNOSIS — R7989 Other specified abnormal findings of blood chemistry: Secondary | ICD-10-CM | POA: Diagnosis not present

## 2023-07-05 DIAGNOSIS — E669 Obesity, unspecified: Secondary | ICD-10-CM | POA: Diagnosis not present

## 2023-07-05 DIAGNOSIS — D8685 Sarcoid myocarditis: Secondary | ICD-10-CM | POA: Diagnosis not present

## 2023-07-06 ENCOUNTER — Telehealth (HOSPITAL_COMMUNITY): Payer: Self-pay | Admitting: *Deleted

## 2023-07-09 ENCOUNTER — Ambulatory Visit (INDEPENDENT_AMBULATORY_CARE_PROVIDER_SITE_OTHER): Payer: 59

## 2023-07-09 DIAGNOSIS — I420 Dilated cardiomyopathy: Secondary | ICD-10-CM

## 2023-07-09 LAB — CUP PACEART REMOTE DEVICE CHECK
Battery Remaining Longevity: 70 mo
Battery Remaining Percentage: 87 %
Battery Voltage: 3.05 V
Brady Statistic AP VP Percent: 1.4 %
Brady Statistic AP VS Percent: 1 %
Brady Statistic AS VP Percent: 97 %
Brady Statistic AS VS Percent: 1 %
Brady Statistic RA Percent Paced: 1 %
Date Time Interrogation Session: 20240816020026
HighPow Impedance: 83 Ohm
HighPow Impedance: 83 Ohm
Implantable Lead Connection Status: 753985
Implantable Lead Connection Status: 753985
Implantable Lead Connection Status: 753985
Implantable Lead Implant Date: 20240214
Implantable Lead Implant Date: 20240214
Implantable Lead Implant Date: 20240214
Implantable Lead Location: 753859
Implantable Lead Location: 753860
Implantable Lead Location: 753860
Implantable Lead Model: 3830
Implantable Lead Model: 7122
Implantable Pulse Generator Implant Date: 20240214
Lead Channel Impedance Value: 400 Ohm
Lead Channel Impedance Value: 490 Ohm
Lead Channel Impedance Value: 550 Ohm
Lead Channel Pacing Threshold Amplitude: 0.5 V
Lead Channel Pacing Threshold Amplitude: 0.75 V
Lead Channel Pacing Threshold Amplitude: 1 V
Lead Channel Pacing Threshold Pulse Width: 0.5 ms
Lead Channel Pacing Threshold Pulse Width: 0.5 ms
Lead Channel Pacing Threshold Pulse Width: 0.5 ms
Lead Channel Sensing Intrinsic Amplitude: 12 mV
Lead Channel Sensing Intrinsic Amplitude: 4.4 mV
Lead Channel Setting Pacing Amplitude: 2 V
Lead Channel Setting Pacing Amplitude: 2.5 V
Lead Channel Setting Pacing Amplitude: 2.5 V
Lead Channel Setting Pacing Pulse Width: 0.5 ms
Lead Channel Setting Pacing Pulse Width: 0.5 ms
Lead Channel Setting Sensing Sensitivity: 0.5 mV
Pulse Gen Serial Number: 5559500
Zone Setting Status: 755011

## 2023-07-19 NOTE — Progress Notes (Signed)
Remote ICD transmission.   

## 2023-07-29 ENCOUNTER — Telehealth (HOSPITAL_COMMUNITY): Payer: Self-pay | Admitting: *Deleted

## 2023-08-25 ENCOUNTER — Encounter: Payer: Self-pay | Admitting: Internal Medicine

## 2023-08-25 ENCOUNTER — Other Ambulatory Visit (HOSPITAL_COMMUNITY): Payer: Self-pay

## 2023-08-25 ENCOUNTER — Other Ambulatory Visit: Payer: Self-pay | Admitting: Internal Medicine

## 2023-08-25 ENCOUNTER — Other Ambulatory Visit: Payer: Self-pay

## 2023-08-25 ENCOUNTER — Encounter (HOSPITAL_COMMUNITY): Payer: Self-pay

## 2023-08-25 DIAGNOSIS — E785 Hyperlipidemia, unspecified: Secondary | ICD-10-CM

## 2023-08-25 DIAGNOSIS — F3342 Major depressive disorder, recurrent, in full remission: Secondary | ICD-10-CM

## 2023-08-26 ENCOUNTER — Other Ambulatory Visit (HOSPITAL_COMMUNITY): Payer: Self-pay

## 2023-08-26 ENCOUNTER — Other Ambulatory Visit: Payer: Self-pay | Admitting: Internal Medicine

## 2023-08-26 ENCOUNTER — Ambulatory Visit (INDEPENDENT_AMBULATORY_CARE_PROVIDER_SITE_OTHER): Payer: 59 | Admitting: Internal Medicine

## 2023-08-26 ENCOUNTER — Encounter: Payer: Self-pay | Admitting: Internal Medicine

## 2023-08-26 VITALS — BP 132/82 | HR 85 | Temp 98.2°F | Resp 16 | Ht 72.0 in | Wt 225.0 lb

## 2023-08-26 DIAGNOSIS — I5022 Chronic systolic (congestive) heart failure: Secondary | ICD-10-CM | POA: Diagnosis not present

## 2023-08-26 DIAGNOSIS — E785 Hyperlipidemia, unspecified: Secondary | ICD-10-CM | POA: Diagnosis not present

## 2023-08-26 DIAGNOSIS — Z7984 Long term (current) use of oral hypoglycemic drugs: Secondary | ICD-10-CM | POA: Diagnosis not present

## 2023-08-26 DIAGNOSIS — Z1211 Encounter for screening for malignant neoplasm of colon: Secondary | ICD-10-CM

## 2023-08-26 DIAGNOSIS — R7989 Other specified abnormal findings of blood chemistry: Secondary | ICD-10-CM

## 2023-08-26 DIAGNOSIS — E781 Pure hyperglyceridemia: Secondary | ICD-10-CM | POA: Diagnosis not present

## 2023-08-26 DIAGNOSIS — F3342 Major depressive disorder, recurrent, in full remission: Secondary | ICD-10-CM | POA: Diagnosis not present

## 2023-08-26 DIAGNOSIS — Z23 Encounter for immunization: Secondary | ICD-10-CM

## 2023-08-26 DIAGNOSIS — E119 Type 2 diabetes mellitus without complications: Secondary | ICD-10-CM | POA: Diagnosis not present

## 2023-08-26 LAB — HEPATIC FUNCTION PANEL
ALT: 70 U/L — ABNORMAL HIGH (ref 0–53)
AST: 49 U/L — ABNORMAL HIGH (ref 0–37)
Albumin: 4.4 g/dL (ref 3.5–5.2)
Alkaline Phosphatase: 88 U/L (ref 39–117)
Bilirubin, Direct: 0.3 mg/dL (ref 0.0–0.3)
Total Bilirubin: 0.9 mg/dL (ref 0.2–1.2)
Total Protein: 7.4 g/dL (ref 6.0–8.3)

## 2023-08-26 LAB — BASIC METABOLIC PANEL
BUN: 14 mg/dL (ref 6–23)
CO2: 28 meq/L (ref 19–32)
Calcium: 9.3 mg/dL (ref 8.4–10.5)
Chloride: 96 meq/L (ref 96–112)
Creatinine, Ser: 0.75 mg/dL (ref 0.40–1.50)
GFR: 106.33 mL/min (ref >60.00)
Glucose, Bld: 256 mg/dL — ABNORMAL HIGH (ref 70–99)
Potassium: 3.9 meq/L (ref 3.5–5.1)
Sodium: 134 meq/L — ABNORMAL LOW (ref 135–145)

## 2023-08-26 LAB — HEMOGLOBIN A1C: Hgb A1c MFr Bld: 7.4 % — ABNORMAL HIGH (ref 4.6–6.5)

## 2023-08-26 LAB — PROTIME-INR
INR: 1.1 {ratio} — ABNORMAL HIGH (ref 0.8–1.0)
Prothrombin Time: 11.5 s (ref 9.6–13.1)

## 2023-08-26 LAB — TRIGLYCERIDES: Triglycerides: 449 mg/dL — ABNORMAL HIGH (ref 0.0–149.0)

## 2023-08-26 MED ORDER — ROSUVASTATIN CALCIUM 10 MG PO TABS
10.0000 mg | ORAL_TABLET | Freq: Every day | ORAL | 1 refills | Status: DC
Start: 2023-08-26 — End: 2024-03-30
  Filled 2023-08-26: qty 30, 30d supply, fill #0
  Filled 2023-09-27: qty 30, 30d supply, fill #1
  Filled 2023-10-28: qty 30, 30d supply, fill #2
  Filled 2023-12-06: qty 30, 30d supply, fill #3
  Filled 2024-01-10: qty 30, 30d supply, fill #4
  Filled 2024-02-16: qty 30, 30d supply, fill #5

## 2023-08-26 MED ORDER — PAROXETINE HCL 20 MG PO TABS
20.0000 mg | ORAL_TABLET | Freq: Every day | ORAL | 1 refills | Status: DC
Start: 2023-08-26 — End: 2024-03-30
  Filled 2023-08-26: qty 30, 30d supply, fill #0
  Filled 2023-09-27: qty 30, 30d supply, fill #1
  Filled 2023-10-28: qty 30, 30d supply, fill #2
  Filled 2023-12-06: qty 30, 30d supply, fill #3
  Filled 2024-01-10: qty 30, 30d supply, fill #4
  Filled 2024-02-16: qty 30, 30d supply, fill #5

## 2023-08-26 MED ORDER — RYBELSUS 3 MG PO TABS
3.0000 mg | ORAL_TABLET | Freq: Every day | ORAL | 0 refills | Status: DC
Start: 2023-08-26 — End: 2023-09-16
  Filled 2023-08-26 – 2023-09-08 (×3): qty 30, 30d supply, fill #0

## 2023-08-26 NOTE — Patient Instructions (Signed)

## 2023-08-26 NOTE — Progress Notes (Signed)
Subjective:  Patient ID: Gregory Stevenson, male    DOB: 1976/06/12  Age: 47 y.o. MRN: [id number]  CC: Hypertension, Hyperlipidemia, and Diabetes   HPI Gregory Stevenson presents for f/up ----  Discussed the use of AI scribe software for clinical note transcription with the patient, who gave verbal consent to proceed.  History of Present Illness   The patient, diagnosed with cardiac sarcoidosis, reports no cardiac symptoms such as chest pain or shortness of breath. They occasionally experience lightheadedness upon standing rapidly after prolonged sitting. They have been under the care of a heart failure specialist and a rheumatologist for the management of their sarcoidosis.  The patient is currently on methotrexate for the treatment of sarcoidosis, having previously been on a regimen of steroids and methotrexate. They have a scheduled PET scan to monitor the progress of their condition.  The patient denies any symptoms of diabetes such as excessive thirst or urination. They also report no symptoms of colon cancer such as blood in the stool or abdominal pain. A previous attempt at colon cancer screening was unsuccessful due to improper following of instructions.  Regarding their sarcoidosis, the patient reports no associated symptoms such as rashes, shortness of breath, or wheezing. They state that they would be unaware of their condition if not for their diagnosis. Their initial scan for sarcoidosis was conducted at [facility] due to the unavailability of the required machine in [place], where they reside.       Outpatient Medications Prior to Visit  Medication Sig Dispense Refill   apixaban (ELIQUIS) 2.5 MG TABS tablet Take 1 tablet (2.5 mg total) by mouth 2 (two) times daily. 60 tablet 6   Cyanocobalamin (B-12 PO) Take 1 tablet by mouth daily.     empagliflozin (JARDIANCE) 10 MG TABS tablet Take 1 tablet (10 mg total) by mouth daily before breakfast. 90 tablet 3   folic acid (FOLVITE) 1 MG  tablet Take 1 tablet (1 mg total) by mouth daily. 30 tablet 3   methotrexate (RHEUMATREX) 2.5 MG tablet Take 8 tablets (20 mg total) by mouth once a week. 96 tablet 1   metoprolol succinate (TOPROL-XL) 100 MG 24 hr tablet Take 1.5 tablets (150 mg total) by mouth daily. Take with or immediately following a meal. 45 tablet 6   sacubitril-valsartan (ENTRESTO) 97-103 MG Take 1 tablet by mouth 2 (two) times daily. 60 tablet 11   spironolactone (ALDACTONE) 25 MG tablet Take 1/2 tablet (12.5 mg total) by mouth daily. 45 tablet 3   PARoxetine (PAXIL) 20 MG tablet Take 1 tablet (20 mg total) by mouth daily. 90 tablet 0   rosuvastatin (CRESTOR) 10 MG tablet Take 1 tablet (10 mg total) by mouth daily. 90 tablet 0   No facility-administered medications prior to visit.    ROS Review of Systems  Constitutional:  Negative for appetite change, diaphoresis, fatigue, fever and unexpected weight change.  HENT: Negative.  Negative for sore throat.   Eyes: Negative.   Respiratory:  Negative for cough, chest tightness, shortness of breath and wheezing.   Cardiovascular:  Negative for chest pain, palpitations and leg swelling.  Gastrointestinal:  Negative for abdominal pain, constipation, diarrhea, nausea and vomiting.  Endocrine: Negative.   Genitourinary: Negative.  Negative for difficulty urinating.  Musculoskeletal: Negative.  Negative for arthralgias and myalgias.  Neurological: Negative.  Negative for dizziness.  Hematological:  Negative for adenopathy. Does not bruise/bleed easily.  Psychiatric/Behavioral: Negative.  Negative for confusion, decreased concentration, dysphoric mood and sleep disturbance. The patient  is not nervous/anxious.     Objective:  BP 132/82 (BP Location: Left Arm, Patient Position: Sitting, Cuff Size: Large)   Pulse 85   Temp 98.2 F (36.8 C) (Oral)   Resp 16   Ht 6' (1.829 m)   Wt 225 lb (102.1 kg)   SpO2 94%   BMI 30.52 kg/m   BP Readings from Last 3 Encounters:   08/26/23 132/82  05/03/23 116/84  04/06/23 124/80    Wt Readings from Last 3 Encounters:  08/26/23 225 lb (102.1 kg)  05/03/23 230 lb (104.3 kg)  04/06/23 229 lb 12.8 oz (104.2 kg)    Physical Exam Vitals reviewed.  Constitutional:      Appearance: Normal appearance. He is not ill-appearing.  HENT:     Nose: Nose normal.     Mouth/Throat:     Mouth: Mucous membranes are moist.  Eyes:     General: No scleral icterus.    Conjunctiva/sclera: Conjunctivae normal.  Cardiovascular:     Rate and Rhythm: Normal rate and regular rhythm.     Heart sounds: No murmur heard.    No friction rub. No gallop.  Pulmonary:     Effort: Pulmonary effort is normal.     Breath sounds: No stridor. No wheezing, rhonchi or rales.  Abdominal:     General: Abdomen is flat.     Palpations: There is no mass.     Tenderness: There is no abdominal tenderness. There is no guarding.     Hernia: No hernia is present.  Musculoskeletal:        General: Normal range of motion.     Cervical back: Neck supple.     Right lower leg: No edema.     Left lower leg: No edema.  Lymphadenopathy:     Cervical: No cervical adenopathy.  Skin:    General: Skin is warm and dry.  Neurological:     General: No focal deficit present.     Mental Status: He is alert. Mental status is at baseline.  Psychiatric:        Mood and Affect: Mood normal.        Behavior: Behavior normal.        Thought Content: Thought content normal.        Judgment: Judgment normal.     Lab Results  Component Value Date   WBC 4.0 04/06/2023   HGB 14.9 04/06/2023   HCT 43.5 04/06/2023   PLT 159 04/06/2023   GLUCOSE 256 (H) 08/26/2023   CHOL 175 12/30/2022   TRIG 449.0 (H) 08/26/2023   HDL 52.10 12/30/2022   LDLDIRECT 85.0 12/30/2022   LDLCALC 111 (H) 12/09/2020   ALT 70 (H) 08/26/2023   AST 49 (H) 08/26/2023   NA 134 (L) 08/26/2023   K 3.9 08/26/2023   CL 96 08/26/2023   CREATININE 0.75 08/26/2023   BUN 14 08/26/2023   CO2  28 08/26/2023   TSH 1.30 12/30/2022   PSA 0.90 12/30/2022   INR 1.1 (H) 08/26/2023   HGBA1C 7.4 (H) 08/26/2023   MICROALBUR 2.8 (H) 12/30/2022    No results found.  Assessment & Plan:   Type 2 diabetes mellitus without complication, without long-term current use of insulin (HCC)- A1C remains too high. Will add a GLP-1 agonist. -     Hemoglobin A1c; Future -     Hepatic function panel; Future -     Ambulatory referral to Ophthalmology -     Rybelsus; Take 1 tablet (3  mg total) by mouth daily.  Dispense: 30 tablet; Refill: 0  Chronic systolic heart failure (HCC) - He has a normal volume status. -     Basic metabolic panel; Future  Hyperlipidemia LDL goal <70 - LDL goal achieved. Doing well on the statin  -     Hepatic function panel; Future -     Rosuvastatin Calcium; Take 1 tablet (10 mg total) by mouth daily.  Dispense: 90 tablet; Refill: 1  Elevated LFTs -     Protime-INR; Future  Flu vaccine need -     Flu vaccine trivalent PF, 6mos and older(Flulaval,Afluria,Fluarix,Fluzone)  Screening for colon cancer -     Cologuard  Hyperglyceridemia -     Triglycerides; Future  Recurrent major depressive disorder, in full remission (HCC) -     PARoxetine HCl; Take 1 tablet (20 mg total) by mouth daily.  Dispense: 90 tablet; Refill: 1     Follow-up: Return in about 6 months (around 02/24/2024).  Sanda Linger, MD

## 2023-08-27 ENCOUNTER — Other Ambulatory Visit (HOSPITAL_COMMUNITY): Payer: Self-pay

## 2023-08-30 ENCOUNTER — Other Ambulatory Visit (HOSPITAL_COMMUNITY): Payer: Self-pay

## 2023-09-03 ENCOUNTER — Encounter (HOSPITAL_COMMUNITY): Payer: Self-pay

## 2023-09-06 ENCOUNTER — Telehealth (HOSPITAL_COMMUNITY): Payer: Self-pay | Admitting: *Deleted

## 2023-09-06 NOTE — Telephone Encounter (Signed)
Attempted to call patient regarding upcoming cardiac PET appointment. Left message on voicemail with name and callback number  Larey Brick RN Navigator Cardiac Imaging [facility] [phone] Office [phone] Cell

## 2023-09-08 ENCOUNTER — Telehealth (HOSPITAL_COMMUNITY): Payer: Self-pay | Admitting: Cardiology

## 2023-09-08 ENCOUNTER — Other Ambulatory Visit (HOSPITAL_COMMUNITY): Payer: Self-pay

## 2023-09-08 ENCOUNTER — Ambulatory Visit (HOSPITAL_COMMUNITY)
Admission: RE | Admit: 2023-09-08 | Discharge: 2023-09-08 | Disposition: A | Discharge status: Home / Self Care | Payer: 59 | Source: Ambulatory Visit | Attending: Internal Medicine | Admitting: Internal Medicine

## 2023-09-08 DIAGNOSIS — D869 Sarcoidosis, unspecified: Secondary | ICD-10-CM | POA: Insufficient documentation

## 2023-09-08 DIAGNOSIS — R918 Other nonspecific abnormal finding of lung field: Secondary | ICD-10-CM

## 2023-09-08 MED ORDER — FLUDEOXYGLUCOSE F - 18 (FDG) INJECTION
9.3800 | Freq: Once | INTRAVENOUS | Status: AC
  Administered 2023-09-08: 9.38 via INTRAVENOUS

## 2023-09-08 MED ORDER — RUBIDIUM RB82 GENERATOR (RUBYFILL)
26.5000 | PACK | Freq: Once | INTRAVENOUS | Status: AC
  Administered 2023-09-08: 26.5 via INTRAVENOUS

## 2023-09-08 NOTE — Telephone Encounter (Signed)
Call report from [facility] See details below     IMPRESSION: 5.5 x 2.1 cm masslike focus of consolidative airspace disease in the left lung base. While this may be infectious/inflammatory, neoplasm not excluded. Dedicated chest CT recommended to further evaluate.

## 2023-09-08 NOTE — Telephone Encounter (Signed)
  Geroge Baseman [place], RN 09/08/2023  4:22 PM EDT Back to Top    Pt aware, referral placed   Dolores Patty, MD 09/08/2023  2:02 PM EDT     Please refer to Dr. Tonia Brooms  in Pulmonary for acute visit to review with him.

## 2023-09-09 ENCOUNTER — Telehealth: Payer: Self-pay | Admitting: Pulmonary Disease

## 2023-09-09 DIAGNOSIS — R918 Other nonspecific abnormal finding of lung field: Secondary | ICD-10-CM

## 2023-09-09 LAB — NM PET CT MYOCARDIAL SARCOIDOSIS
LV dias vol: 172 mL (ref 62–150)
LV sys vol: 99 mL
Nuc Stress EF: 42 %
Rest Nuclear Isotope Dose: 26.5 mCi

## 2023-09-09 NOTE — Telephone Encounter (Signed)
Pulmonary referral to Dr. Tonia Brooms for left lung mass.  Patient had PET scan with cardiology showing mass left lower lung and suggesting CT chest follow up may be needed. CT chest wo confirmed with Icard and order placed.  Notified patient that he has the pulmonary referral and we had placed order for CT chest to evaluate this area in more detail before the consult with Dr. Tonia Brooms.  Patient acknowledged understanding and will be expecting calls to schedule CT and OV.

## 2023-09-10 ENCOUNTER — Telehealth: Payer: Self-pay | Admitting: Pharmacy Technician

## 2023-09-10 ENCOUNTER — Other Ambulatory Visit (HOSPITAL_COMMUNITY): Payer: Self-pay

## 2023-09-10 NOTE — Telephone Encounter (Signed)
Pharmacy Patient Advocate Encounter   Received notification from CoverMyMeds that prior authorization for rybelsus is required/requested.   Insurance verification completed.   The patient is insured through [employer] [facility] .   Per test claim: PA required; PA started via CoverMyMeds. KEY [id number] . Waiting for clinical questions to populate.

## 2023-09-10 NOTE — Telephone Encounter (Signed)
Clinical Questions Have been submitted

## 2023-09-14 NOTE — Telephone Encounter (Signed)
Patient medication was denied, Do you want me to just inform the patient that the medication is denied or would you let him try one that's covered ?

## 2023-09-14 NOTE — Telephone Encounter (Signed)
Pharmacy Patient Advocate Encounter  Received notification from [employer] [facility] that Prior Authorization for Rybelsus 3MG  tablets has been DENIED.  Full denial letter will be uploaded to the media tab. See denial reason below.  Your plan only covers this drug when you meet one of these options: A) You have tried other products your plan covers (preferred products), and they did not work well for you, or B) Your doctor gives [place] a medical reason you cannot take those other products. For your plan, you may need to try up to three preferred products, Thre preferred products for your plan are: Trulicity, Victoza. Your doctor may need to get approval from your plan for preferred products. For this drug, you may have to meet other criteria.   PA #/Case ID/Reference #: [id number]   Please be advised we currently do not have a Pharmacist to review denials, therefore you will need to process appeals accordingly as needed. Thanks for your support at this time. Contact for appeals are as follows: Phone: [phone] member services, [phone] providers call/precert, Fax: xxx

## 2023-09-16 ENCOUNTER — Encounter: Payer: Self-pay | Admitting: Internal Medicine

## 2023-09-16 ENCOUNTER — Ambulatory Visit (HOSPITAL_COMMUNITY)
Admission: RE | Admit: 2023-09-16 | Discharge: 2023-09-16 | Disposition: A | Discharge status: Home / Self Care | Payer: 59 | Source: Ambulatory Visit | Attending: Pulmonary Disease | Admitting: Pulmonary Disease

## 2023-09-16 ENCOUNTER — Other Ambulatory Visit (HOSPITAL_COMMUNITY): Payer: Self-pay

## 2023-09-16 ENCOUNTER — Other Ambulatory Visit: Payer: Self-pay | Admitting: Internal Medicine

## 2023-09-16 DIAGNOSIS — R918 Other nonspecific abnormal finding of lung field: Secondary | ICD-10-CM | POA: Diagnosis not present

## 2023-09-16 DIAGNOSIS — E119 Type 2 diabetes mellitus without complications: Secondary | ICD-10-CM

## 2023-09-16 DIAGNOSIS — R590 Localized enlarged lymph nodes: Secondary | ICD-10-CM | POA: Diagnosis not present

## 2023-09-16 DIAGNOSIS — R911 Solitary pulmonary nodule: Secondary | ICD-10-CM | POA: Diagnosis not present

## 2023-09-16 MED ORDER — METFORMIN HCL ER 750 MG PO TB24
1500.0000 mg | ORAL_TABLET | Freq: Every day | ORAL | 0 refills | Status: DC
  Filled 2023-09-16: qty 60, 30d supply, fill #0
  Filled 2023-10-28: qty 60, 30d supply, fill #1
  Filled 2023-12-06: qty 60, 30d supply, fill #2

## 2023-09-16 NOTE — Telephone Encounter (Signed)
Patient is aware of the denial and the medication change. He gave a verbal understanding.

## 2023-09-27 ENCOUNTER — Other Ambulatory Visit (HOSPITAL_COMMUNITY): Payer: Self-pay | Admitting: Internal Medicine

## 2023-09-27 ENCOUNTER — Other Ambulatory Visit (HOSPITAL_COMMUNITY): Payer: Self-pay

## 2023-09-27 ENCOUNTER — Other Ambulatory Visit: Payer: Self-pay | Admitting: Cardiology

## 2023-09-27 ENCOUNTER — Other Ambulatory Visit: Payer: Self-pay

## 2023-09-27 MED ORDER — SPIRONOLACTONE 25 MG PO TABS
12.5000 mg | ORAL_TABLET | Freq: Every day | ORAL | 2 refills | Status: DC
  Filled 2023-09-27: qty 15, 30d supply, fill #0
  Filled 2023-10-28: qty 15, 30d supply, fill #1
  Filled 2023-12-06: qty 15, 30d supply, fill #2
  Filled 2024-01-10: qty 15, 30d supply, fill #3
  Filled 2024-02-16: qty 15, 30d supply, fill #4
  Filled 2024-03-23: qty 15, 30d supply, fill #5
  Filled 2024-04-24: qty 15, 30d supply, fill #6
  Filled 2024-06-03: qty 15, 30d supply, fill #7
  Filled 2024-07-18: qty 15, 30d supply, fill #8

## 2023-09-27 MED ORDER — METOPROLOL SUCCINATE ER 100 MG PO TB24
150.0000 mg | ORAL_TABLET | Freq: Every day | ORAL | 6 refills | Status: DC
  Filled 2023-09-27: qty 45, 30d supply, fill #0
  Filled 2023-10-28: qty 45, 30d supply, fill #1
  Filled 2023-12-06: qty 45, 30d supply, fill #2
  Filled 2024-01-10: qty 45, 30d supply, fill #3
  Filled 2024-02-16: qty 45, 30d supply, fill #4
  Filled 2024-03-23: qty 45, 30d supply, fill #5
  Filled 2024-04-24: qty 45, 30d supply, fill #6

## 2023-09-28 ENCOUNTER — Ambulatory Visit: Payer: 59 | Admitting: Acute Care

## 2023-09-28 ENCOUNTER — Encounter: Payer: Self-pay | Admitting: Acute Care

## 2023-09-28 ENCOUNTER — Other Ambulatory Visit (HOSPITAL_COMMUNITY): Payer: Self-pay

## 2023-09-28 VITALS — BP 96/60 | HR 78 | Temp 98.1°F | Ht 72.0 in | Wt 218.6 lb

## 2023-09-28 DIAGNOSIS — D8685 Sarcoid myocarditis: Secondary | ICD-10-CM

## 2023-09-28 DIAGNOSIS — R918 Other nonspecific abnormal finding of lung field: Secondary | ICD-10-CM | POA: Diagnosis not present

## 2023-09-28 DIAGNOSIS — Z87891 Personal history of nicotine dependence: Secondary | ICD-10-CM | POA: Diagnosis not present

## 2023-09-28 NOTE — Progress Notes (Signed)
History of Present Illness Gregory Stevenson is a 47 y.o. male former smoker ( quit 30 years ago) referred 09/2023 to see Dr. Tonia Brooms for a left lung mass.   Synopsis Gregory Stevenson is a 47 y.o. male former smoker ( quit 30 years ago) referred 09/2023 to see Dr. Tonia Brooms for a left lung mass. Patient had PET scan with cardiology showing mass left lower lung and suggesting CT chest follow up may be needed. CT chest without contrast was confirmed with Icard and order placed to evaluate this area in more detail before the consult . He is here today to go over the results of the CT Chest without contrast.    09/28/2023 Pt. Presents for  follow up after CT Chest without contrast to better evaluate a left lung mass.  Pt. was sick the whole month of September and October. 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 He states symptoms have subsequently resolved. He was not treated with antibiotics, this apparently self resolved.  We have reviewed the follow up Ct chest which shows an Interval decreased size of left lower lobe consolidation with residual linear opacity and adjacent nodularity, likely due to resolving infection or aspiration. New clustered solid nodules of the left upper lobe, almost certainly due to infection or aspiration given rapid interval development, and enlarged mediastinal lymph nodes, likely reactive that are most likely reactive in nature. Radiology recommend a 3 month follow up Ct chest.   Test Results: NM PET Myocardial Sarcoidosis 09/08/2023 5.5 x 2.1 cm masslike focus of consolidative airspace disease in the left lung base. While this may be infectious/inflammatory, neoplasm not excluded. Dedicated chest CT recommended to further evaluate.  09/16/2023 CT Chest  Mediastinum/Nodes: Esophagus and thyroid are unremarkable. Enlarged mediastinal lymph nodes, reference right upper paratracheal  lymph node measuring 14 mm in short axis on series 2, image 37.   Lungs/Pleura: Central airways are patent. Interval decreased size of left lower lobe consolidation with residual linear opacity with adjacent nodularity. Reference left lower lobe nodule measuring 4 mm on series 6, image 114. New clustered solid nodules of the left upper lobe, largest measures 7 mm on series 6, image 86. Pleural effusion or pneumothorax.  Interval decreased size of left lower lobe consolidation with residual linear opacity and adjacent nodularity, likely due to resolving infection or aspiration. Recommend follow-up chest CT in 3 months to ensure complete resolution. 2. New clustered solid nodules of the left upper lobe, almost certainly due to infection or aspiration given rapid interval development. 3. Enlarged mediastinal lymph nodes, likely reactive. Recommend attention on follow-up. 4. Hepatic steatosis and aortic Atherosclerosis (ICD10-I70.0).      Latest Ref Rng & Units 04/06/2023    2:55 PM 12/30/2022   12:37 PM 06/04/2022    3:43 PM  CBC  WBC 4.0 - 10.5 K/uL 4.0  4.1  5.5   Hemoglobin 13.0 - 17.0 g/dL 16.1  09.6  04.5   Hematocrit 39.0 - 52.0 % 43.5  42.7  42.8   Platelets 150 - 400 K/uL 159  153.0  191        Latest Ref Rng & Units 08/26/2023   10:58 AM 04/06/2023    2:55 PM 12/30/2022   12:37 PM  BMP  Glucose 70 - 99 mg/dL 409  811  914   BUN 6 - 23 mg/dL 14  11  9    Creatinine 0.40 - 1.50  mg/dL 2.59  5.63  8.75   Sodium 135 - 145 mEq/L 134  138  143   Potassium 3.5 - 5.1 mEq/L 3.9  4.1  4.6   Chloride 96 - 112 mEq/L 96  103  102   CO2 19 - 32 mEq/L 28  23  29    Calcium 8.4 - 10.5 mg/dL 9.3  9.4  9.6     BNP    Component Value Date/Time   BNP 65.4 04/06/2023 1455    ProBNP No results found for: "PROBNP"  PFT No results found for: "FEV1PRE", "FEV1POST", "FVCPRE", "FVCPOST", "TLC", "DLCOUNC", "PREFEV1FVCRT", "PSTFEV1FVCRT"  CT Chest Wo Contrast  Result Date:  09/28/2023 CLINICAL DATA:  Masslike consolidation seen on PET-CT EXAM: CT CHEST WITHOUT CONTRAST TECHNIQUE: Multidetector CT imaging of the chest was performed following the standard protocol without IV contrast. RADIATION DOSE REDUCTION: This exam was performed according to the departmental dose-optimization program which includes automated exposure control, adjustment of the mA and/or kV according to patient size and/or use of iterative reconstruction technique. COMPARISON:  Cardiac PET-CT dated September 08, 2023 FINDINGS: Cardiovascular: Normal heart size. No pericardial effusion. Normal caliber thoracic aorta with mild calcified plaque. Left chest wall AICD with leads in the right atrium and right ventricle. Mediastinum/Nodes: Esophagus and thyroid are unremarkable. Enlarged mediastinal lymph nodes, reference right upper paratracheal lymph node measuring 14 mm in short axis on series 2, image 37. Lungs/Pleura: Central airways are patent. Interval decreased size of left lower lobe consolidation with residual linear opacity with adjacent nodularity. Reference left lower lobe nodule measuring 4 mm on series 6, image 114. New clustered solid nodules of the left upper lobe, largest measures 7 mm on series 6, image 86. Pleural effusion or pneumothorax. Upper Abdomen: Hepatic steatosis. Musculoskeletal: No chest wall mass or suspicious bone lesions identified. IMPRESSION: 1. Interval decreased size of left lower lobe consolidation with residual linear opacity and adjacent nodularity, likely due to resolving infection or aspiration. Recommend follow-up chest CT in 3 months to ensure complete resolution. 2. New clustered solid nodules of the left upper lobe, almost certainly due to infection or aspiration given rapid interval development. 3. Enlarged mediastinal lymph nodes, likely reactive. Recommend attention on follow-up. 4. Hepatic steatosis and aortic Atherosclerosis (ICD10-I70.0). Electronically Signed   By: Allegra Lai M.D.   On: 09/28/2023 08:59   NM PET CT MYOCARDIAL SARCOIDOSIS  Result Date: 09/09/2023   Severe, medium size perfusion defect in the basal to mid anteroseptum, anterior, anterolateral segments. Severe, small size perfusion defect present in the basal to mid inferior segments. No FDG uptake observed in either defect. 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   FDG uptake was not observed. LV perfusion is abnormal. Defect 1: There is a medium defect with severe reduction in uptake present in the mid to basal anterior, anterolateral and anteroseptal location(s). FDG uptake was not present in the described defect segment(s). There is abnormal wall motion in the defect area. Defect 2: There is a small defect with severe reduction in uptake present in the mid to basal inferior location(s). There is abnormal wall motion in the defect area.   Left ventricular function is abnormal. Global function is mildly reduced. There were multiple regional abnormalities. EF: 42%. End diastolic cavity size is moderately enlarged.   Coronary calcium was absent on the attenuation correction CT images.   FDG uptake findings  are inconsistent with active myocardial inflammation/sarcoidosis. Electronically signed by Lennie Odor, MD ___________________________________________________________________________ ______________________________ Francia Greaves: OVER-READ INTERPRETATION  PET-CT CHEST The following report is an over-read performed by radiologist Dr. Leatha Gilding [facility] on 09/08/2023. This over-read does not include interpretation of cardiac or coronary anatomy or pathology. The cardiac PET and cardiac CT interpretation by the cardiologist is to be attached. COMPARISON:  None. FINDINGS: No evidence for lymphadenopathy within the visualized mediastinum or hilar regions. 5.5 x 2.1  cm masslike focus of consolidative airspace disease is seen in the left lung base (53/3). No effusion. Visualized portions of the upper abdomen are unremarkable. No suspicious lytic or sclerotic osseous abnormality. IMPRESSION: 5.5 x 2.1 cm masslike focus of consolidative airspace disease in the left lung base. While this may be infectious/inflammatory, neoplasm not excluded. Dedicated chest CT recommended to further evaluate. These results will be called to the ordering clinician or representative by the Radiologist Assistant, and communication documented in the PACS or [employer]. Electronically Signed   By: Kennith Center M.D.   On: 09/08/2023 10:13    Past medical hx Past Medical History:  Diagnosis Date   Alcoholic fatty liver    Alcoholism (HCC)    sober since 10/2020   Anxiety    Chronic systolic CHF (congestive heart failure) (HCC)    HTN (hypertension)    OSA (obstructive sleep apnea)    on CPAP   Type 2 diabetes mellitus (HCC)      Social History   Tobacco Use   Smoking status: Former    Types: Cigarettes   Smokeless tobacco: Never   Tobacco comments:    Patient quit 20 years ago  Substance Use Topics   Alcohol use: Yes    Alcohol/week: 3.0 standard drinks of alcohol    Types: 3 Standard drinks or equivalent per week    Comment: prior alcohol abuse; sober for 1 month   Drug use: Never    Mr.Dubey reports that he has quit smoking. His smoking use included cigarettes. He has never used smokeless tobacco. He reports current alcohol use of about 3.0 standard drinks of alcohol per week. He reports that he does not use drugs.  Tobacco Cessation: Counseling given: Not Answered Tobacco comments: Patient quit 20 years ago   Past surgical hx, Family hx, Social hx all reviewed.  Current Outpatient Medications on File Prior to Visit  Medication Sig   apixaban (ELIQUIS) 2.5 MG TABS tablet Take 1 tablet (2.5 mg total) by mouth 2 (two) times daily.   Cyanocobalamin (B-12  PO) Take 1 tablet by mouth daily.   empagliflozin (JARDIANCE) 10 MG TABS tablet Take 1 tablet (10 mg total) by mouth daily before breakfast.   folic acid (FOLVITE) 1 MG tablet Take 1 tablet (1 mg total) by mouth daily.   metFORMIN (GLUCOPHAGE-XR) 750 MG 24 hr tablet Take 2 tablets (1,500 mg total) by mouth daily with breakfast.   methotrexate (RHEUMATREX) 2.5 MG tablet Take 8 tablets (20 mg total) by mouth once a week.   metoprolol succinate (TOPROL-XL) 100 MG 24 hr tablet Take 1.5 tablets (150 mg total) by mouth daily. Take with or immediately following a meal.   PARoxetine (PAXIL) 20 MG tablet Take 1 tablet (20 mg total) by mouth daily.   rosuvastatin (CRESTOR) 10 MG tablet Take 1 tablet (10 mg total) by mouth daily.   sacubitril-valsartan (ENTRESTO) 97-103 MG Take 1 tablet by mouth 2 (two) times daily.   spironolactone (ALDACTONE) 25 MG tablet Take 1/2  tablet (12.5 mg total) by mouth daily.   [DISCONTINUED] carvedilol (COREG) 12.5 MG tablet Take 1 tablet (12.5 mg total) by mouth 2 (two) times daily with a meal.   No current facility-administered medications on file prior to visit.     No Known Allergies  Review Of Systems:  Constitutional:   No  weight loss, night sweats,  Fevers, chills, fatigue, or  lassitude.  HEENT:   No headaches,  Difficulty swallowing,  Tooth/dental problems, or  Sore throat,                No sneezing, itching, ear ache, nasal congestion, post nasal drip,   CV:  No chest pain,  Orthopnea, PND, swelling in lower extremities, anasarca, dizziness, palpitations, syncope.   GI  No heartburn, indigestion, abdominal pain, nausea, vomiting, diarrhea, change in bowel habits, loss of appetite, bloody stools.   Resp: No shortness of breath with exertion or at rest.  No excess mucus, no productive cough,  No non-productive cough,  No coughing up of blood.  No change in color of mucus.  No wheezing.  No chest wall deformity  Skin: no rash or lesions.  GU: no dysuria,  change in color of urine, no urgency or frequency.  No flank pain, no hematuria   MS:  No joint pain or swelling.  No decreased range of motion.  No back pain.  Psych:  No change in mood or affect. No depression or anxiety.  No memory loss.   Vital Signs BP 96/60 (BP Location: Left Arm, Cuff Size: Normal)   Pulse 78   Temp 98.1 F (36.7 C) (Temporal)   Ht 6' (1.829 m)   Wt 218 lb 9.6 oz (99.2 kg)   SpO2 93%   BMI 29.65 kg/m    Physical Exam:  General- No distress,  A&Ox3, pleasant ENT: No sinus tenderness, TM clear, pale nasal mucosa, no oral exudate,no post nasal drip, no LAN Cardiac: S1, S2, regular rate and rhythm, no murmur Chest: No wheeze/ rales/ dullness; no accessory muscle use, no nasal flaring, no sternal retractions, diminished per bases Abd.: Soft Non-tender, ND, BS +, Body mass index is 29.65 kg/m.  Ext: No clubbing cyanosis, edema Neuro:  normal strength, MAE x 4, A&O x 3, appropriate Skin: No rashes, warm and dry, no lesions  Psych: normal mood and behavior   Assessment/Plan Interval decrease in size of previously noted left lower lobe consolidation  Cardiac Sarcoidosis Plan The area  of concern in the left lower lobe has decreased in size on the dedicated CT Chest and is most likely infectious or inflammatory. This is good news. We will do a follow up scan in 3 months , this will be sometime after 12/17/2023. You will get a call to get this scheduled.  Then we will see you in the office to review the results.  Call if you have any questions or concerns.  Please contact office for sooner follow up if symptoms do not improve or worsen or seek emergency care     I spent 20 minutes dedicated to the care of this patient on the date of this encounter to include pre-visit review of records, face-to-face time with the patient discussing conditions above, post visit ordering of testing, clinical documentation with the electronic health record, making appropriate  referrals as documented, and communicating necessary information to the patient's healthcare team.    Bevelyn Ngo, NP 09/28/2023  9:06 AM

## 2023-09-28 NOTE — Progress Notes (Signed)
OV with SG today   Thanks,  BLI  Josephine Igo, DO  Pulmonary Critical Care 09/28/2023 10:18 AM

## 2023-09-28 NOTE — Patient Instructions (Addendum)
It is good to see you today. The area  of concern in the left lower lobe has decreased in size on the dedicated CT Chest and is most likely infectious or inflammatory. This is good news. We will do a follow up scan in 3 months , this will be sometime after 12/17/2023. You will get a call to get this scheduled.  Then we will see you in the office to review the results.  Call if you have any questions or concerns.  Please contact office for sooner follow up if symptoms do not improve or worsen or seek emergency care

## 2023-10-05 ENCOUNTER — Encounter: Payer: Self-pay | Admitting: Acute Care

## 2023-10-08 ENCOUNTER — Ambulatory Visit (INDEPENDENT_AMBULATORY_CARE_PROVIDER_SITE_OTHER): Payer: 59

## 2023-10-08 DIAGNOSIS — I420 Dilated cardiomyopathy: Secondary | ICD-10-CM | POA: Diagnosis not present

## 2023-10-09 LAB — CUP PACEART REMOTE DEVICE CHECK
Battery Remaining Longevity: 67 mo
Battery Remaining Percentage: 84 %
Battery Voltage: 3.02 V
Brady Statistic AP VP Percent: 1.6 %
Brady Statistic AP VS Percent: 1 %
Brady Statistic AS VP Percent: 97 %
Brady Statistic AS VS Percent: 1 %
Brady Statistic RA Percent Paced: 1 %
Date Time Interrogation Session: 20241115020017
HighPow Impedance: 91 Ohm
HighPow Impedance: 91 Ohm
Implantable Lead Connection Status: 753985
Implantable Lead Connection Status: 753985
Implantable Lead Connection Status: 753985
Implantable Lead Implant Date: 20240214
Implantable Lead Implant Date: 20240214
Implantable Lead Implant Date: 20240214
Implantable Lead Location: 753859
Implantable Lead Location: 753860
Implantable Lead Location: 753860
Implantable Lead Model: 3830
Implantable Lead Model: 7122
Implantable Pulse Generator Implant Date: 20240214
Lead Channel Impedance Value: 390 Ohm
Lead Channel Impedance Value: 510 Ohm
Lead Channel Impedance Value: 590 Ohm
Lead Channel Pacing Threshold Amplitude: 0.5 V
Lead Channel Pacing Threshold Amplitude: 0.75 V
Lead Channel Pacing Threshold Amplitude: 1 V
Lead Channel Pacing Threshold Pulse Width: 0.5 ms
Lead Channel Pacing Threshold Pulse Width: 0.5 ms
Lead Channel Pacing Threshold Pulse Width: 0.5 ms
Lead Channel Sensing Intrinsic Amplitude: 11.9 mV
Lead Channel Sensing Intrinsic Amplitude: 2.8 mV
Lead Channel Setting Pacing Amplitude: 2 V
Lead Channel Setting Pacing Amplitude: 2.5 V
Lead Channel Setting Pacing Amplitude: 2.5 V
Lead Channel Setting Pacing Pulse Width: 0.5 ms
Lead Channel Setting Pacing Pulse Width: 0.5 ms
Lead Channel Setting Sensing Sensitivity: 0.5 mV
Pulse Gen Serial Number: 5559500
Zone Setting Status: 755011

## 2023-10-11 ENCOUNTER — Other Ambulatory Visit (HOSPITAL_COMMUNITY): Payer: Self-pay

## 2023-10-11 DIAGNOSIS — E669 Obesity, unspecified: Secondary | ICD-10-CM | POA: Diagnosis not present

## 2023-10-11 DIAGNOSIS — R7989 Other specified abnormal findings of blood chemistry: Secondary | ICD-10-CM | POA: Diagnosis not present

## 2023-10-11 DIAGNOSIS — D8685 Sarcoid myocarditis: Secondary | ICD-10-CM | POA: Diagnosis not present

## 2023-10-11 DIAGNOSIS — Z6830 Body mass index (BMI) 30.0-30.9, adult: Secondary | ICD-10-CM | POA: Diagnosis not present

## 2023-10-11 MED ORDER — FOLIC ACID 1 MG PO TABS
1.0000 mg | ORAL_TABLET | Freq: Every day | ORAL | 3 refills | Status: DC
  Filled 2023-10-11: qty 30, 30d supply, fill #0
  Filled 2024-03-23: qty 90, 90d supply, fill #0

## 2023-10-11 MED ORDER — METHOTREXATE SODIUM 2.5 MG PO TABS
15.0000 mg | ORAL_TABLET | ORAL | 1 refills | Status: DC
  Filled 2023-10-11 – 2024-02-16 (×3): qty 24, 28d supply, fill #0
  Filled 2024-03-23: qty 78, 91d supply, fill #0

## 2023-10-13 ENCOUNTER — Other Ambulatory Visit (HOSPITAL_COMMUNITY): Payer: Self-pay

## 2023-10-13 LAB — HM DIABETES EYE EXAM

## 2023-10-13 MED ORDER — METHOTREXATE SODIUM 2.5 MG PO TABS
12.5000 mg | ORAL_TABLET | ORAL | 0 refills | Status: DC
  Filled 2023-10-13: qty 20, 28d supply, fill #0
  Filled 2023-10-13: qty 20, 30d supply, fill #0
  Filled 2023-10-28: qty 20, 28d supply, fill #0
  Filled 2023-12-06: qty 20, 28d supply, fill #1
  Filled 2024-02-16 – 2024-03-23 (×2): qty 20, 28d supply, fill #2

## 2023-10-13 MED ORDER — FOLIC ACID 1 MG PO TABS
2.0000 mg | ORAL_TABLET | Freq: Every day | ORAL | 3 refills | Status: DC
  Filled 2023-10-13: qty 180, 30d supply, fill #0
  Filled 2023-10-13 – 2023-10-28 (×2): qty 60, 30d supply, fill #0
  Filled 2024-03-23: qty 60, 30d supply, fill #1

## 2023-10-20 ENCOUNTER — Other Ambulatory Visit (HOSPITAL_COMMUNITY): Payer: Self-pay

## 2023-10-20 NOTE — Progress Notes (Signed)
Remote ICD transmission.   

## 2023-10-27 ENCOUNTER — Other Ambulatory Visit (HOSPITAL_COMMUNITY): Payer: Self-pay | Admitting: Internal Medicine

## 2023-10-28 ENCOUNTER — Other Ambulatory Visit: Payer: Self-pay

## 2023-10-28 ENCOUNTER — Other Ambulatory Visit (HOSPITAL_COMMUNITY): Payer: Self-pay

## 2023-10-29 ENCOUNTER — Ambulatory Visit: Payer: 59 | Admitting: Acute Care

## 2023-11-01 ENCOUNTER — Other Ambulatory Visit (HOSPITAL_COMMUNITY): Payer: Self-pay

## 2023-12-06 ENCOUNTER — Other Ambulatory Visit: Payer: Self-pay

## 2023-12-06 ENCOUNTER — Other Ambulatory Visit (HOSPITAL_COMMUNITY): Payer: Self-pay | Admitting: Internal Medicine

## 2023-12-07 ENCOUNTER — Other Ambulatory Visit (HOSPITAL_COMMUNITY): Payer: Self-pay

## 2023-12-07 MED ORDER — APIXABAN 2.5 MG PO TABS
2.5000 mg | ORAL_TABLET | Freq: Two times a day (BID) | ORAL | 3 refills | Status: DC
  Filled 2023-12-07: qty 60, 30d supply, fill #0
  Filled 2024-03-23: qty 60, 30d supply, fill #1
  Filled 2024-06-03: qty 60, 30d supply, fill #2
  Filled 2024-07-18: qty 60, 30d supply, fill #3

## 2023-12-08 ENCOUNTER — Other Ambulatory Visit (HOSPITAL_COMMUNITY): Payer: Self-pay

## 2023-12-20 ENCOUNTER — Ambulatory Visit
Admission: RE | Admit: 2023-12-20 | Discharge: 2023-12-20 | Disposition: A | Discharge status: Home / Self Care | Payer: 59 | Source: Ambulatory Visit | Attending: Acute Care | Admitting: Acute Care

## 2023-12-20 DIAGNOSIS — I700 Atherosclerosis of aorta: Secondary | ICD-10-CM | POA: Diagnosis not present

## 2023-12-20 DIAGNOSIS — I2510 Atherosclerotic heart disease of native coronary artery without angina pectoris: Secondary | ICD-10-CM | POA: Diagnosis not present

## 2023-12-20 DIAGNOSIS — R918 Other nonspecific abnormal finding of lung field: Secondary | ICD-10-CM

## 2023-12-20 DIAGNOSIS — N62 Hypertrophy of breast: Secondary | ICD-10-CM | POA: Diagnosis not present

## 2023-12-20 DIAGNOSIS — R599 Enlarged lymph nodes, unspecified: Secondary | ICD-10-CM | POA: Diagnosis not present

## 2023-12-29 ENCOUNTER — Ambulatory Visit: Payer: 59 | Admitting: Pulmonary Disease

## 2024-01-07 ENCOUNTER — Ambulatory Visit (INDEPENDENT_AMBULATORY_CARE_PROVIDER_SITE_OTHER): Payer: 59

## 2024-01-07 DIAGNOSIS — I420 Dilated cardiomyopathy: Secondary | ICD-10-CM

## 2024-01-07 LAB — CUP PACEART REMOTE DEVICE CHECK
Battery Remaining Longevity: 65 mo
Battery Remaining Percentage: 81 %
Battery Voltage: 2.99 V
Brady Statistic AP VP Percent: 1.3 %
Brady Statistic AP VS Percent: 1 %
Brady Statistic AS VP Percent: 97 %
Brady Statistic AS VS Percent: 1 %
Brady Statistic RA Percent Paced: 1 %
Date Time Interrogation Session: 20250214020018
HighPow Impedance: 86 Ohm
HighPow Impedance: 86 Ohm
Implantable Lead Connection Status: 753985
Implantable Lead Connection Status: 753985
Implantable Lead Connection Status: 753985
Implantable Lead Implant Date: 20240214
Implantable Lead Implant Date: 20240214
Implantable Lead Implant Date: 20240214
Implantable Lead Location: 753859
Implantable Lead Location: 753860
Implantable Lead Location: 753860
Implantable Lead Model: 3830
Implantable Lead Model: 7122
Implantable Pulse Generator Implant Date: 20240214
Lead Channel Impedance Value: 390 Ohm
Lead Channel Impedance Value: 540 Ohm
Lead Channel Impedance Value: 590 Ohm
Lead Channel Pacing Threshold Amplitude: 0.5 V
Lead Channel Pacing Threshold Amplitude: 0.75 V
Lead Channel Pacing Threshold Amplitude: 1 V
Lead Channel Pacing Threshold Pulse Width: 0.5 ms
Lead Channel Pacing Threshold Pulse Width: 0.5 ms
Lead Channel Pacing Threshold Pulse Width: 0.5 ms
Lead Channel Sensing Intrinsic Amplitude: 12 mV
Lead Channel Sensing Intrinsic Amplitude: 2.8 mV
Lead Channel Setting Pacing Amplitude: 2 V
Lead Channel Setting Pacing Amplitude: 2.5 V
Lead Channel Setting Pacing Amplitude: 2.5 V
Lead Channel Setting Pacing Pulse Width: 0.5 ms
Lead Channel Setting Pacing Pulse Width: 0.5 ms
Lead Channel Setting Sensing Sensitivity: 0.5 mV
Pulse Gen Serial Number: 5559500
Zone Setting Status: 755011

## 2024-01-10 ENCOUNTER — Other Ambulatory Visit (HOSPITAL_COMMUNITY): Payer: Self-pay

## 2024-01-10 ENCOUNTER — Other Ambulatory Visit: Payer: Self-pay

## 2024-01-10 ENCOUNTER — Other Ambulatory Visit (HOSPITAL_COMMUNITY): Payer: Self-pay | Admitting: Cardiology

## 2024-01-10 ENCOUNTER — Other Ambulatory Visit: Payer: Self-pay | Admitting: Internal Medicine

## 2024-01-10 DIAGNOSIS — E119 Type 2 diabetes mellitus without complications: Secondary | ICD-10-CM

## 2024-01-10 MED ORDER — EMPAGLIFLOZIN 10 MG PO TABS
10.0000 mg | ORAL_TABLET | Freq: Every day | ORAL | 3 refills | Status: AC
  Filled 2024-01-10: qty 30, 30d supply, fill #0
  Filled 2024-02-16: qty 30, 30d supply, fill #1
  Filled 2024-03-23: qty 30, 30d supply, fill #2
  Filled 2024-04-24: qty 30, 30d supply, fill #3
  Filled 2024-06-03: qty 30, 30d supply, fill #4
  Filled 2024-07-18: qty 30, 30d supply, fill #5
  Filled 2024-08-28: qty 30, 30d supply, fill #6
  Filled 2024-09-30: qty 30, 30d supply, fill #7
  Filled 2024-11-05: qty 30, 30d supply, fill #8
  Filled 2024-12-20: qty 30, 30d supply, fill #9

## 2024-01-11 DIAGNOSIS — Z6830 Body mass index (BMI) 30.0-30.9, adult: Secondary | ICD-10-CM | POA: Diagnosis not present

## 2024-01-11 DIAGNOSIS — R7989 Other specified abnormal findings of blood chemistry: Secondary | ICD-10-CM | POA: Diagnosis not present

## 2024-01-11 DIAGNOSIS — D8685 Sarcoid myocarditis: Secondary | ICD-10-CM | POA: Diagnosis not present

## 2024-01-11 DIAGNOSIS — E669 Obesity, unspecified: Secondary | ICD-10-CM | POA: Diagnosis not present

## 2024-01-12 ENCOUNTER — Other Ambulatory Visit (HOSPITAL_COMMUNITY): Payer: Self-pay

## 2024-01-12 ENCOUNTER — Other Ambulatory Visit: Payer: Self-pay

## 2024-01-12 LAB — LAB REPORT - SCANNED: EGFR: 107

## 2024-01-12 MED ORDER — METHOTREXATE SODIUM 2.5 MG PO TABS
10.0000 mg | ORAL_TABLET | ORAL | 1 refills | Status: DC
  Filled 2024-01-12 (×2): qty 16, 28d supply, fill #0
  Filled 2024-02-16: qty 16, 28d supply, fill #1
  Filled 2024-03-23 – 2024-03-27 (×2): qty 16, 28d supply, fill #2
  Filled 2024-04-24: qty 16, 28d supply, fill #3
  Filled 2024-06-03: qty 16, 28d supply, fill #4
  Filled 2024-07-18: qty 16, 28d supply, fill #5
  Filled 2024-08-28: qty 16, 28d supply, fill #6

## 2024-01-12 MED ORDER — FOLIC ACID 1 MG PO TABS
2.0000 mg | ORAL_TABLET | Freq: Every day | ORAL | 1 refills | Status: DC
  Filled 2024-01-12: qty 60, 30d supply, fill #0
  Filled 2024-03-29: qty 60, 30d supply, fill #1
  Filled 2024-04-24: qty 60, 30d supply, fill #2
  Filled 2024-06-03: qty 60, 30d supply, fill #3
  Filled 2024-07-18: qty 60, 30d supply, fill #4
  Filled 2024-08-28: qty 60, 30d supply, fill #5

## 2024-01-13 ENCOUNTER — Other Ambulatory Visit (HOSPITAL_COMMUNITY): Payer: Self-pay

## 2024-01-13 MED ORDER — METFORMIN HCL ER 750 MG PO TB24
1500.0000 mg | ORAL_TABLET | Freq: Every day | ORAL | 1 refills | Status: DC
  Filled 2024-01-13: qty 60, 30d supply, fill #0
  Filled 2024-02-16: qty 60, 30d supply, fill #1
  Filled 2024-03-23: qty 60, 30d supply, fill #2
  Filled 2024-04-24: qty 60, 30d supply, fill #3
  Filled 2024-06-03: qty 60, 30d supply, fill #4
  Filled 2024-07-18: qty 60, 30d supply, fill #5

## 2024-02-07 NOTE — Progress Notes (Signed)
 Remote ICD transmission.

## 2024-02-08 ENCOUNTER — Encounter: Payer: Self-pay | Admitting: Internal Medicine

## 2024-02-16 ENCOUNTER — Other Ambulatory Visit (HOSPITAL_COMMUNITY): Payer: Self-pay

## 2024-02-17 ENCOUNTER — Other Ambulatory Visit: Payer: Self-pay

## 2024-02-18 ENCOUNTER — Other Ambulatory Visit (HOSPITAL_COMMUNITY): Payer: Self-pay

## 2024-03-20 ENCOUNTER — Telehealth (HOSPITAL_COMMUNITY): Payer: Self-pay | Admitting: Internal Medicine

## 2024-03-23 ENCOUNTER — Telehealth (HOSPITAL_COMMUNITY): Payer: Self-pay | Admitting: Internal Medicine

## 2024-03-23 ENCOUNTER — Other Ambulatory Visit (HOSPITAL_COMMUNITY): Payer: Self-pay

## 2024-03-23 ENCOUNTER — Other Ambulatory Visit: Payer: Self-pay | Admitting: Internal Medicine

## 2024-03-23 ENCOUNTER — Other Ambulatory Visit: Payer: Self-pay

## 2024-03-23 DIAGNOSIS — E785 Hyperlipidemia, unspecified: Secondary | ICD-10-CM

## 2024-03-23 DIAGNOSIS — F3342 Major depressive disorder, recurrent, in full remission: Secondary | ICD-10-CM

## 2024-03-24 ENCOUNTER — Other Ambulatory Visit (HOSPITAL_COMMUNITY): Payer: Self-pay

## 2024-03-29 ENCOUNTER — Other Ambulatory Visit (HOSPITAL_COMMUNITY): Payer: Self-pay

## 2024-03-29 ENCOUNTER — Other Ambulatory Visit: Payer: Self-pay | Admitting: Internal Medicine

## 2024-03-29 ENCOUNTER — Telehealth: Payer: Self-pay | Admitting: Internal Medicine

## 2024-03-29 DIAGNOSIS — F3342 Major depressive disorder, recurrent, in full remission: Secondary | ICD-10-CM

## 2024-03-29 DIAGNOSIS — E785 Hyperlipidemia, unspecified: Secondary | ICD-10-CM

## 2024-03-29 NOTE — Telephone Encounter (Signed)
 Copied from CRM [id number]. Topic: Clinical - Medication Question >> Mar 29, 2024 12:28 PM Marlan Silva wrote: Reason for CRM: Patient called in with questions about his medications, he wanted to know if he should still be taking them PARoxetine  (PAXIL ) 20 MG tablet and rosuvastatin  (CRESTOR ) 10 MG tablet. Patient is requesting a call back to the number on file.

## 2024-03-30 ENCOUNTER — Other Ambulatory Visit: Payer: Self-pay | Admitting: Internal Medicine

## 2024-03-30 ENCOUNTER — Other Ambulatory Visit: Payer: Self-pay

## 2024-03-30 ENCOUNTER — Other Ambulatory Visit (HOSPITAL_COMMUNITY): Payer: Self-pay

## 2024-03-30 DIAGNOSIS — F3342 Major depressive disorder, recurrent, in full remission: Secondary | ICD-10-CM

## 2024-03-30 DIAGNOSIS — E785 Hyperlipidemia, unspecified: Secondary | ICD-10-CM

## 2024-03-30 MED ORDER — PAROXETINE HCL 20 MG PO TABS
20.0000 mg | ORAL_TABLET | Freq: Every day | ORAL | 0 refills | Status: DC
  Filled 2024-03-30: qty 19, 19d supply, fill #0

## 2024-03-30 MED ORDER — ROSUVASTATIN CALCIUM 10 MG PO TABS
10.0000 mg | ORAL_TABLET | Freq: Every day | ORAL | 0 refills | Status: DC
  Filled 2024-03-30: qty 19, 19d supply, fill #0

## 2024-03-30 NOTE — Telephone Encounter (Signed)
 Patient has been scheduled for a follow up to see Dr. Rochelle Chu. I sent in just enough medication to get him to his appointment. Patient gave a verbal understanding that he will not receive any more medication until he is seen.

## 2024-03-30 NOTE — Telephone Encounter (Signed)
 Copied from CRM [id number]. Topic: Clinical - Medication Refill >> Mar 30, 2024  2:37 PM Juleen Oakland F wrote: Medication: PARoxetine  rosuvastatin   Has the patient contacted their pharmacy? Yes (Agent: If no, request that the patient contact the pharmacy for the refill. If patient does not wish to contact the pharmacy document the reason why and proceed with request.) (Agent: If yes, when and what did the pharmacy advise?)  This is the patient's preferred pharmacy:  [facility] [address] [place] [address] Phone: [phone] Fax: [phone]  Is this the correct pharmacy for this prescription? Yes If no, delete pharmacy and type the correct one.   Has the prescription been filled recently? Yes  Is the patient out of the medication? Yes  Has the patient been seen for an appointment in the last year OR does the patient have an upcoming appointment? Yes  Can we respond through MyChart? Yes  Agent: Please be advised that Rx refills may take up to 3 business days. We ask that you follow-up with your pharmacy.

## 2024-03-30 NOTE — Telephone Encounter (Signed)
 Copied from CRM [id number]. Topic: Clinical - Medication Question >> Mar 29, 2024 12:28 PM Marlan Silva wrote: Reason for CRM: Patient called in with questions about his medications, he wanted to know if he should still be taking them PARoxetine  (PAXIL ) 20 MG tablet and rosuvastatin  (CRESTOR ) 10 MG tablet. Patient is requesting a call back to the number on file. >> Mar 29, 2024  4:09 PM Chuck Crater wrote: Patient is calling for an update. He stated that pharmacy advised him that he needs approval for those and he is worried. He has been out of those 2 for a week.

## 2024-03-31 ENCOUNTER — Other Ambulatory Visit (HOSPITAL_COMMUNITY): Payer: Self-pay

## 2024-04-07 ENCOUNTER — Ambulatory Visit (INDEPENDENT_AMBULATORY_CARE_PROVIDER_SITE_OTHER): Payer: 59

## 2024-04-07 DIAGNOSIS — I420 Dilated cardiomyopathy: Secondary | ICD-10-CM | POA: Diagnosis not present

## 2024-04-07 LAB — CUP PACEART REMOTE DEVICE CHECK
Battery Remaining Longevity: 64 mo
Battery Remaining Percentage: 78 %
Battery Voltage: 2.99 V
Brady Statistic AP VP Percent: 1.3 %
Brady Statistic AP VS Percent: 1 %
Brady Statistic AS VP Percent: 97 %
Brady Statistic AS VS Percent: 1 %
Brady Statistic RA Percent Paced: 1 %
Date Time Interrogation Session: 20250516020017
HighPow Impedance: 91 Ohm
HighPow Impedance: 91 Ohm
Implantable Lead Connection Status: 753985
Implantable Lead Connection Status: 753985
Implantable Lead Connection Status: 753985
Implantable Lead Implant Date: 20240214
Implantable Lead Implant Date: 20240214
Implantable Lead Implant Date: 20240214
Implantable Lead Location: 753859
Implantable Lead Location: 753860
Implantable Lead Location: 753860
Implantable Lead Model: 3830
Implantable Lead Model: 7122
Implantable Pulse Generator Implant Date: 20240214
Lead Channel Impedance Value: 400 Ohm
Lead Channel Impedance Value: 530 Ohm
Lead Channel Impedance Value: 590 Ohm
Lead Channel Pacing Threshold Amplitude: 0.5 V
Lead Channel Pacing Threshold Amplitude: 0.75 V
Lead Channel Pacing Threshold Amplitude: 1 V
Lead Channel Pacing Threshold Pulse Width: 0.5 ms
Lead Channel Pacing Threshold Pulse Width: 0.5 ms
Lead Channel Pacing Threshold Pulse Width: 0.5 ms
Lead Channel Sensing Intrinsic Amplitude: 3.4 mV
Lead Channel Sensing Intrinsic Amplitude: 9.7 mV
Lead Channel Setting Pacing Amplitude: 2 V
Lead Channel Setting Pacing Amplitude: 2.5 V
Lead Channel Setting Pacing Amplitude: 2.5 V
Lead Channel Setting Pacing Pulse Width: 0.5 ms
Lead Channel Setting Pacing Pulse Width: 0.5 ms
Lead Channel Setting Sensing Sensitivity: 0.5 mV
Pulse Gen Serial Number: 5559500
Zone Setting Status: 755011

## 2024-04-09 ENCOUNTER — Ambulatory Visit: Payer: Self-pay | Admitting: Cardiology

## 2024-04-18 ENCOUNTER — Ambulatory Visit (INDEPENDENT_AMBULATORY_CARE_PROVIDER_SITE_OTHER): Admitting: Internal Medicine

## 2024-04-18 ENCOUNTER — Other Ambulatory Visit (HOSPITAL_COMMUNITY): Payer: Self-pay

## 2024-04-18 VITALS — BP 144/86 | HR 83 | Temp 99.2°F | Resp 16 | Ht 72.0 in | Wt 225.4 lb

## 2024-04-18 DIAGNOSIS — R7989 Other specified abnormal findings of blood chemistry: Secondary | ICD-10-CM

## 2024-04-18 DIAGNOSIS — E119 Type 2 diabetes mellitus without complications: Secondary | ICD-10-CM

## 2024-04-18 DIAGNOSIS — E785 Hyperlipidemia, unspecified: Secondary | ICD-10-CM | POA: Diagnosis not present

## 2024-04-18 DIAGNOSIS — I10 Essential (primary) hypertension: Secondary | ICD-10-CM | POA: Diagnosis not present

## 2024-04-18 DIAGNOSIS — Z23 Encounter for immunization: Secondary | ICD-10-CM | POA: Diagnosis not present

## 2024-04-18 DIAGNOSIS — I5022 Chronic systolic (congestive) heart failure: Secondary | ICD-10-CM | POA: Diagnosis not present

## 2024-04-18 DIAGNOSIS — D8685 Sarcoid myocarditis: Secondary | ICD-10-CM | POA: Insufficient documentation

## 2024-04-18 DIAGNOSIS — F3342 Major depressive disorder, recurrent, in full remission: Secondary | ICD-10-CM | POA: Diagnosis not present

## 2024-04-18 DIAGNOSIS — Z1211 Encounter for screening for malignant neoplasm of colon: Secondary | ICD-10-CM | POA: Insufficient documentation

## 2024-04-18 DIAGNOSIS — Z0001 Encounter for general adult medical examination with abnormal findings: Secondary | ICD-10-CM

## 2024-04-18 DIAGNOSIS — Z0000 Encounter for general adult medical examination without abnormal findings: Secondary | ICD-10-CM | POA: Diagnosis not present

## 2024-04-18 DIAGNOSIS — D696 Thrombocytopenia, unspecified: Secondary | ICD-10-CM | POA: Diagnosis not present

## 2024-04-18 DIAGNOSIS — Z125 Encounter for screening for malignant neoplasm of prostate: Secondary | ICD-10-CM | POA: Diagnosis not present

## 2024-04-18 MED ORDER — PAROXETINE HCL 20 MG PO TABS
20.0000 mg | ORAL_TABLET | Freq: Every day | ORAL | 1 refills | Status: DC
  Filled 2024-04-18: qty 30, 30d supply, fill #0
  Filled 2024-06-03: qty 30, 30d supply, fill #1
  Filled 2024-07-18: qty 30, 30d supply, fill #2
  Filled 2024-08-28: qty 30, 30d supply, fill #3
  Filled 2024-09-30: qty 30, 30d supply, fill #4
  Filled 2024-11-05: qty 30, 30d supply, fill #5

## 2024-04-18 MED ORDER — ROSUVASTATIN CALCIUM 10 MG PO TABS
10.0000 mg | ORAL_TABLET | Freq: Every day | ORAL | 1 refills | Status: DC
  Filled 2024-04-18: qty 30, 30d supply, fill #0
  Filled 2024-06-03: qty 30, 30d supply, fill #1
  Filled 2024-07-18: qty 30, 30d supply, fill #2
  Filled 2024-08-28: qty 30, 30d supply, fill #3
  Filled 2024-09-30: qty 30, 30d supply, fill #4
  Filled 2024-11-05: qty 30, 30d supply, fill #5

## 2024-04-18 NOTE — Progress Notes (Unsigned)
 Subjective:  Patient ID: Gregory Stevenson, male    DOB: June 14, 1976  Age: 48 y.o. MRN: [id number]  CC: Annual Exam, Hypertension, Hyperlipidemia, and Diabetes   HPI Gregory Stevenson presents for a CPX and f/up ----  Discussed the use of AI scribe software for clinical note transcription with the patient, who gave verbal consent to proceed.  History of Present Illness   Gregory Stevenson "Gregory Stevenson" is a 48 year old male who presents for an annual physical exam.  He experiences occasional dizziness or lightheadedness when standing up after sitting for long periods, a symptom he has noticed for years. He manages this by sitting back down and getting up more slowly.  He tries to stay active by joining his roommate for long walks three times a week, covering about a mile and a half each time. He feels good during these walks without panting or wheezing, although he acknowledges he should probably do more.  He is a nonsmoker and an occasional drinker, having reduced his alcohol intake significantly from his past heavy drinking habits. He notes a tendency to drink more during baseball season but has since cut back to a few beers on weekends.  He had an eye exam in November, which resulted in a new prescription. He is not aware of any signs of diabetic eye damage from that exam.  He attempted a colon cancer screening with a home test last year but did not follow the instructions correctly and has not repeated the test since. He denies any family history of colon cancer and has not experienced symptoms such as blood in the stool.  No chest pain, shortness of breath, fever, chills, or abdominal pain.        Outpatient Medications Prior to Visit  Medication Sig Dispense Refill   apixaban  (ELIQUIS ) 2.5 MG TABS tablet Take 1 tablet (2.5 mg total) by mouth 2 (two) times daily. NEEDS FOLLOW UP APPOINTMENT FOR MORE REFILLS 60 tablet 3   Cyanocobalamin (B-12 PO) Take 1 tablet by mouth daily.     empagliflozin   (JARDIANCE ) 10 MG TABS tablet Take 1 tablet (10 mg total) by mouth daily before breakfast. 90 tablet 3   folic acid  (FOLVITE ) 1 MG tablet Take 2 tablets (2 mg total) by mouth daily. (Patient taking differently: Take 1 mg by mouth 2 (two) times daily.) 180 tablet 1   metFORMIN  (GLUCOPHAGE -XR) 750 MG 24 hr tablet Take 2 tablets (1,500 mg total) by mouth daily with breakfast. 180 tablet 1   methotrexate  (RHEUMATREX) 2.5 MG tablet Take 4 tablets (10 mg total) by mouth once a week. 52 tablet 1   metoprolol  succinate (TOPROL -XL) 100 MG 24 hr tablet Take 1.5 tablets (150 mg total) by mouth daily. Take with or immediately following a meal. 45 tablet 6   sacubitril -valsartan  (ENTRESTO ) 97-103 MG Take 1 tablet by mouth 2 (two) times daily. 60 tablet 11   spironolactone  (ALDACTONE ) 25 MG tablet Take 1/2 tablet (12.5 mg total) by mouth daily. 45 tablet 2   PARoxetine  (PAXIL ) 20 MG tablet Take 1 tablet (20 mg total) by mouth daily. 19 tablet 0   rosuvastatin  (CRESTOR ) 10 MG tablet Take 1 tablet (10 mg total) by mouth daily. 19 tablet 0   folic acid  (FOLVITE ) 1 MG tablet Take 1 tablet (1 mg total) by mouth daily. 30 tablet 3   folic acid  (FOLVITE ) 1 MG tablet Take 1 tablet (1 mg total) by mouth daily. 90 tablet 3   folic acid  (FOLVITE ) 1 MG tablet  Take 2 tablets (2 mg total) by mouth daily. 180 tablet 3   methotrexate  (RHEUMATREX) 2.5 MG tablet Take 6 tablets (15 mg total) by mouth once a week. 78 tablet 1   methotrexate  (RHEUMATREX) 2.5 MG tablet Take 5 tablets (12.5 mg total) by mouth once a week. 65 tablet 0   No facility-administered medications prior to visit.    ROS Review of Systems  Constitutional:  Negative for appetite change, chills, diaphoresis, fatigue and fever.  HENT: Negative.    Respiratory: Negative.  Negative for cough, chest tightness, shortness of breath and wheezing.   Cardiovascular:  Negative for chest pain, palpitations and leg swelling.  Gastrointestinal: Negative.  Negative for  abdominal pain, constipation, diarrhea, nausea and vomiting.  Genitourinary: Negative.  Negative for difficulty urinating.  Musculoskeletal: Negative.  Negative for arthralgias and myalgias.  Skin: Negative.  Negative for color change and pallor.  Neurological:  Positive for dizziness and light-headedness. Negative for weakness and headaches.  Hematological:  Negative for adenopathy. Does not bruise/bleed easily.  Psychiatric/Behavioral: Negative.  Negative for sleep disturbance. The patient is not nervous/anxious.     Objective:  BP (!) 144/86 (BP Location: Left Arm, Patient Position: Sitting, Cuff Size: Normal)   Pulse 83   Temp 99.2 F (37.3 C) (Oral)   Resp 16   Ht 6' (1.829 m)   Wt 225 lb 6.4 oz (102.2 kg)   SpO2 97%   BMI 30.57 kg/m   BP Readings from Last 3 Encounters:  04/18/24 (!) 144/86  09/28/23 96/60  08/26/23 132/82    Wt Readings from Last 3 Encounters:  04/18/24 225 lb 6.4 oz (102.2 kg)  09/28/23 218 lb 9.6 oz (99.2 kg)  08/26/23 225 lb (102.1 kg)    Physical Exam Vitals reviewed.  Constitutional:      Appearance: Normal appearance. He is not ill-appearing.  HENT:     Nose: Nose normal.     Mouth/Throat:     Mouth: Mucous membranes are moist.  Eyes:     General: No scleral icterus.    Conjunctiva/sclera: Conjunctivae normal.  Cardiovascular:     Rate and Rhythm: Normal rate and regular rhythm.     Heart sounds: No murmur heard.    No friction rub. No gallop.     Comments: EKG--- Atrial-sensed ventricular-paced rhythm, 78 bpm Pulmonary:     Effort: Pulmonary effort is normal.     Breath sounds: No stridor. No wheezing, rhonchi or rales.  Abdominal:     General: Abdomen is flat.     Palpations: There is no mass.     Tenderness: There is no abdominal tenderness. There is no guarding.     Hernia: No hernia is present.  Musculoskeletal:        General: Normal range of motion.     Cervical back: Neck supple.     Right lower leg: No edema.      Left lower leg: No edema.  Lymphadenopathy:     Cervical: No cervical adenopathy.  Skin:    General: Skin is warm and dry.  Neurological:     General: No focal deficit present.     Mental Status: He is alert and oriented to person, place, and time. Mental status is at baseline.  Psychiatric:        Mood and Affect: Mood normal.        Behavior: Behavior normal.     Lab Results  Component Value Date   WBC 3.2 (L) 04/18/2024  HGB 14.6 04/18/2024   HCT 42.8 04/18/2024   PLT 122.0 (L) 04/18/2024   GLUCOSE 138 (H) 04/18/2024   CHOL 156 04/18/2024   TRIG 257.0 (H) 04/18/2024   HDL 56.80 04/18/2024   LDLDIRECT 85.0 12/30/2022   LDLCALC 48 04/18/2024   ALT 63 (H) 04/18/2024   AST 51 (H) 04/18/2024   NA 140 04/18/2024   K 3.9 04/18/2024   CL 99 04/18/2024   CREATININE 0.72 04/18/2024   BUN 11 04/18/2024   CO2 31 04/18/2024   TSH 1.10 04/18/2024   PSA 1.25 04/18/2024   INR 1.2 (H) 04/18/2024   HGBA1C 6.6 (H) 04/18/2024   MICROALBUR 2.6 (H) 04/18/2024    CT CHEST WO CONTRAST Result Date: 12/26/2023 CLINICAL DATA:  Follow-up pulmonary nodules. History of sarcoidosis. EXAM: CT CHEST WITHOUT CONTRAST TECHNIQUE: Multidetector CT imaging of the chest was performed following the standard protocol without IV contrast. RADIATION DOSE REDUCTION: This exam was performed according to the departmental dose-optimization program which includes automated exposure control, adjustment of the mA and/or kV according to patient size and/or use of iterative reconstruction technique. COMPARISON:  09/16/2023 chest CT. FINDINGS: Cardiovascular: Normal heart size. No significant pericardial effusion/thickening. Left anterior descending coronary atherosclerosis. Stable 3 lead left subclavian ICD with lead tips in the right atrium and right ventricle. Great vessels are normal in course and caliber. Mediastinum/Nodes: No significant thyroid  nodules. Unremarkable esophagus. No axillary adenopathy. Mildly enlarged  1.3 cm high right paratracheal node (series 2/image 28), mildly enlarged 1.0 cm left paratracheal node (series 2/image 58) and mildly enlarged 1.4 cm subcarinal node (series 2/image 73), all stable. No new pathologically enlarged mediastinal nodes. No discrete hilar adenopathy on these noncontrast images. Lungs/Pleura: No pneumothorax. No pleural effusion. No acute consolidative airspace disease, lung masses or significant pulmonary nodules. Previously described clustered nodularity in the posterior left upper lobe from 09/16/2023 chest CT has resolved. Previously described linear consolidation with adjacent clustered indistinct nodularity in the anterior left lower lobe from 09/16/2023 chest CT has resolved. Upper abdomen: Diffuse hepatic steatosis. Mildly enlarged 1.0 cm gastrohepatic ligament node (series 2/image 143), stable. Musculoskeletal: No aggressive appearing focal osseous lesions. Symmetric mild gynecomastia, similar. IMPRESSION: 1. Resolved multilobar left lung pneumonia. No active pulmonary disease. 2. Stable mild mediastinal adenopathy, compatible with reported history of sarcoidosis. 3. One vessel coronary atherosclerosis. 4. Diffuse hepatic steatosis. 5. Symmetric mild gynecomastia, similar. 6.  Aortic Atherosclerosis (ICD10-I70.0). Electronically Signed   By: Levell Reach M.D.   On: 12/26/2023 23:14    Assessment & Plan:   Type 2 diabetes mellitus without complication, without long-term current use of insulin (HCC)- Blood sugar is well controlled. -     Urinalysis, Routine w reflex microscopic; Future -     Hepatic function panel; Future -     Hemoglobin A1c; Future -     Microalbumin / creatinine urine ratio; Future -     Basic metabolic panel with GFR; Future -     HM Diabetes Foot Exam  Hyperlipidemia LDL goal <70- LDL goal achieved. Doing well on the statin  -     Lipid panel; Future -     TSH; Future -     Hepatic function panel; Future -     Rosuvastatin  Calcium ; Take 1  tablet (10 mg total) by mouth daily.  Dispense: 90 tablet; Refill: 1  Encounter for general adult medical examination with abnormal findings- Exam completed, labs reviewed, vaccines reviewed and updated, cancer screenings addressed, pt ed material was given.  -  PSA; Future  Chronic systolic heart failure (HCC) -     CBC with Differential/Platelet; Future -     Basic metabolic panel with GFR; Future -     EKG 12-Lead  Immunization due -     Pneumococcal conjugate vaccine 20-valent  Hypertension, unspecified type- His SBP remains elevated. He is working on his lifestyle modifications. -     EKG 12-Lead  Elevated LFTs -     Protime-INR; Future  Screening for colon cancer -     Cologuard  Recurrent major depressive disorder, in full remission (HCC) -     PARoxetine  HCl; Take 1 tablet (20 mg total) by mouth daily.  Dispense: 90 tablet; Refill: 1  Thrombocytopenia (HCC) -     Vitamin B12; Future     Follow-up: Return in about 6 months (around 10/19/2024).  Sandra Crouch, MD

## 2024-04-18 NOTE — Patient Instructions (Signed)
 Health Maintenance, Male  Adopting a healthy lifestyle and getting preventive care are important in promoting health and wellness. Ask your health care provider about:  The right schedule for you to have regular tests and exams.  Things you can do on your own to prevent diseases and keep yourself healthy.  What should I know about diet, weight, and exercise?  Eat a healthy diet    Eat a diet that includes plenty of vegetables, fruits, low-fat dairy products, and lean protein.  Do not eat a lot of foods that are high in solid fats, added sugars, or sodium.  Maintain a healthy weight  Body mass index (BMI) is a measurement that can be used to identify possible weight problems. It estimates body fat based on height and weight. Your health care provider can help determine your BMI and help you achieve or maintain a healthy weight.  Get regular exercise  Get regular exercise. This is one of the most important things you can do for your health. Most adults should:  Exercise for at least 150 minutes each week. The exercise should increase your heart rate and make you sweat (moderate-intensity exercise).  Do strengthening exercises at least twice a week. This is in addition to the moderate-intensity exercise.  Spend less time sitting. Even light physical activity can be beneficial.  Watch cholesterol and blood lipids  Have your blood tested for lipids and cholesterol at 50 years of age, then have this test every 5 years.  You may need to have your cholesterol levels checked more often if:  Your lipid or cholesterol levels are high.  You are older than 50 years of age.  You are at high risk for heart disease.  What should I know about cancer screening?  Many types of cancers can be detected early and may often be prevented. Depending on your health history and family history, you may need to have cancer screening at various ages. This may include screening for:  Colorectal cancer.  Prostate cancer.  Skin cancer.  Lung  cancer.  What should I know about heart disease, diabetes, and high blood pressure?  Blood pressure and heart disease  High blood pressure causes heart disease and increases the risk of stroke. This is more likely to develop in people who have high blood pressure readings or are overweight.  Talk with your health care provider about your target blood pressure readings.  Have your blood pressure checked:  Every 3-5 years if you are 9-95 years of age.  Every year if you are 85 years old or older.  If you are between the ages of 29 and 29 and are a current or former smoker, ask your health care provider if you should have a one-time screening for abdominal aortic aneurysm (AAA).  Diabetes  Have regular diabetes screenings. This checks your fasting blood sugar level. Have the screening done:  Once every three years after age 23 if you are at a normal weight and have a low risk for diabetes.  More often and at a younger age if you are overweight or have a high risk for diabetes.  What should I know about preventing infection?  Hepatitis B  If you have a higher risk for hepatitis B, you should be screened for this virus. Talk with your health care provider to find out if you are at risk for hepatitis B infection.  Hepatitis C  Blood testing is recommended for:  Everyone born from [birth year] through 1967.  Anyone  with known risk factors for hepatitis C.  Sexually transmitted infections (STIs)  You should be screened each year for STIs, including gonorrhea and chlamydia, if:  You are sexually active and are younger than 50 years of age.  You are older than 50 years of age and your health care provider tells you that you are at risk for this type of infection.  Your sexual activity has changed since you were last screened, and you are at increased risk for chlamydia or gonorrhea. Ask your health care provider if you are at risk.  Ask your health care provider about whether you are at high risk for HIV. Your health care provider  may recommend a prescription medicine to help prevent HIV infection. If you choose to take medicine to prevent HIV, you should first get tested for HIV. You should then be tested every 3 months for as long as you are taking the medicine.  Follow these instructions at home:  Alcohol use  Do not drink alcohol if your health care provider tells you not to drink.  If you drink alcohol:  Limit how much you have to 0-2 drinks a day.  Know how much alcohol is in your drink. In the [place] one drink equals one 12 oz bottle of beer (355 mL), one 5 oz glass of wine (148 mL), or one 1 oz glass of hard liquor (44 mL).  Lifestyle  Do not use any products that contain nicotine or tobacco. These products include cigarettes, chewing tobacco, and vaping devices, such as e-cigarettes. If you need help quitting, ask your health care provider.  Do not use street drugs.  Do not share needles.  Ask your health care provider for help if you need support or information about quitting drugs.  General instructions  Schedule regular health, dental, and eye exams.  Stay current with your vaccines.  Tell your health care provider if:  You often feel depressed.  You have ever been abused or do not feel safe at home.  Summary  Adopting a healthy lifestyle and getting preventive care are important in promoting health and wellness.  Follow your health care provider's instructions about healthy diet, exercising, and getting tested or screened for diseases.  Follow your health care provider's instructions on monitoring your cholesterol and blood pressure.  This information is not intended to replace advice given to you by your health care provider. Make sure you discuss any questions you have with your health care provider.  Document Revised: 03/31/2021 Document Reviewed: 03/31/2021  Elsevier Patient Education  2024 [employer].

## 2024-04-19 ENCOUNTER — Ambulatory Visit: Payer: Self-pay | Admitting: Internal Medicine

## 2024-04-19 DIAGNOSIS — D696 Thrombocytopenia, unspecified: Secondary | ICD-10-CM | POA: Insufficient documentation

## 2024-04-19 LAB — LIPID PANEL
Cholesterol: 156 mg/dL (ref 0–200)
HDL: 56.8 mg/dL (ref >39.00)
LDL Cholesterol: 48 mg/dL (ref 0–99)
NonHDL: 99.13
Total CHOL/HDL Ratio: 3
Triglycerides: 257 mg/dL — ABNORMAL HIGH (ref 0.0–149.0)
VLDL: 51.4 mg/dL — ABNORMAL HIGH (ref 0.0–40.0)

## 2024-04-19 LAB — CBC WITH DIFFERENTIAL/PLATELET
Basophils Absolute: 0 10*3/uL (ref 0.0–0.1)
Basophils Relative: 1 % (ref 0.0–3.0)
Eosinophils Absolute: 0 10*3/uL (ref 0.0–0.7)
Eosinophils Relative: 1.5 % (ref 0.0–5.0)
HCT: 42.8 % (ref 39.0–52.0)
Hemoglobin: 14.6 g/dL (ref 13.0–17.0)
Lymphocytes Relative: 25.9 % (ref 12.0–46.0)
Lymphs Abs: 0.8 10*3/uL (ref 0.7–4.0)
MCHC: 34.2 g/dL (ref 30.0–36.0)
MCV: 98.5 fl (ref 78.0–100.0)
Monocytes Absolute: 0.6 10*3/uL (ref 0.1–1.0)
Monocytes Relative: 17.9 % — ABNORMAL HIGH (ref 3.0–12.0)
Neutro Abs: 1.7 10*3/uL (ref 1.4–7.7)
Neutrophils Relative %: 53.7 % (ref 43.0–77.0)
Platelets: 122 10*3/uL — ABNORMAL LOW (ref 150.0–400.0)
RBC: 4.34 Mil/uL (ref 4.22–5.81)
RDW: 13 % (ref 11.5–15.5)
WBC: 3.2 10*3/uL — ABNORMAL LOW (ref 4.0–10.5)

## 2024-04-19 LAB — URINALYSIS, ROUTINE W REFLEX MICROSCOPIC
Bilirubin Urine: NEGATIVE
Hgb urine dipstick: NEGATIVE
Ketones, ur: NEGATIVE
Leukocytes,Ua: NEGATIVE
Nitrite: NEGATIVE
RBC / HPF: NONE SEEN (ref 0–?)
Specific Gravity, Urine: 1.01 (ref 1.000–1.030)
Total Protein, Urine: NEGATIVE
Urine Glucose: >=1000 — AB
Urobilinogen, UA: 1 (ref 0.0–1.0)
pH: 7 (ref 5.0–8.0)

## 2024-04-19 LAB — HEPATIC FUNCTION PANEL
ALT: 63 U/L — ABNORMAL HIGH (ref 0–53)
AST: 51 U/L — ABNORMAL HIGH (ref 0–37)
Albumin: 4.7 g/dL (ref 3.5–5.2)
Alkaline Phosphatase: 78 U/L (ref 39–117)
Bilirubin, Direct: 0.2 mg/dL (ref 0.0–0.3)
Total Bilirubin: 0.6 mg/dL (ref 0.2–1.2)
Total Protein: 7.4 g/dL (ref 6.0–8.3)

## 2024-04-19 LAB — PROTIME-INR
INR: 1.2 ratio — ABNORMAL HIGH (ref 0.8–1.0)
Prothrombin Time: 12.3 s (ref 9.6–13.1)

## 2024-04-19 LAB — MICROALBUMIN / CREATININE URINE RATIO
Creatinine,U: 67.8 mg/dL
Microalb Creat Ratio: 37.8 mg/g — ABNORMAL HIGH (ref 0.0–30.0)
Microalb, Ur: 2.6 mg/dL — ABNORMAL HIGH (ref 0.0–1.9)

## 2024-04-19 LAB — BASIC METABOLIC PANEL WITH GFR
BUN: 11 mg/dL (ref 6–23)
CO2: 31 meq/L (ref 19–32)
Calcium: 9.7 mg/dL (ref 8.4–10.5)
Chloride: 99 meq/L (ref 96–112)
Creatinine, Ser: 0.72 mg/dL (ref 0.40–1.50)
GFR: 107.16 mL/min (ref >60.00)
Glucose, Bld: 138 mg/dL — ABNORMAL HIGH (ref 70–99)
Potassium: 3.9 meq/L (ref 3.5–5.1)
Sodium: 140 meq/L (ref 135–145)

## 2024-04-19 LAB — HEMOGLOBIN A1C: Hgb A1c MFr Bld: 6.6 % — ABNORMAL HIGH (ref 4.6–6.5)

## 2024-04-19 LAB — TSH: TSH: 1.1 u[IU]/mL (ref 0.35–5.50)

## 2024-04-19 LAB — PSA: PSA: 1.25 ng/mL (ref 0.10–4.00)

## 2024-05-15 NOTE — Progress Notes (Signed)
 Remote ICD transmission.

## 2024-06-03 ENCOUNTER — Other Ambulatory Visit (HOSPITAL_COMMUNITY): Payer: Self-pay | Admitting: Internal Medicine

## 2024-06-05 ENCOUNTER — Other Ambulatory Visit (HOSPITAL_COMMUNITY): Payer: Self-pay

## 2024-06-05 ENCOUNTER — Other Ambulatory Visit: Payer: Self-pay

## 2024-06-05 MED ORDER — METOPROLOL SUCCINATE ER 100 MG PO TB24
150.0000 mg | ORAL_TABLET | Freq: Every day | ORAL | 0 refills | Status: DC
  Filled 2024-06-05: qty 45, 30d supply, fill #0

## 2024-06-07 ENCOUNTER — Other Ambulatory Visit (HOSPITAL_COMMUNITY): Payer: Self-pay

## 2024-07-07 ENCOUNTER — Ambulatory Visit (INDEPENDENT_AMBULATORY_CARE_PROVIDER_SITE_OTHER): Payer: 59

## 2024-07-07 DIAGNOSIS — I420 Dilated cardiomyopathy: Secondary | ICD-10-CM

## 2024-07-10 LAB — CUP PACEART REMOTE DEVICE CHECK
Battery Remaining Longevity: 60 mo
Battery Remaining Percentage: 75 %
Battery Voltage: 2.98 V
Brady Statistic AP VP Percent: 1.4 %
Brady Statistic AP VS Percent: 1 %
Brady Statistic AS VP Percent: 97 %
Brady Statistic AS VS Percent: 1 %
Brady Statistic RA Percent Paced: 1 %
Date Time Interrogation Session: 20250815020021
HighPow Impedance: 90 Ohm
HighPow Impedance: 90 Ohm
Implantable Lead Connection Status: 753985
Implantable Lead Connection Status: 753985
Implantable Lead Connection Status: 753985
Implantable Lead Implant Date: 20240214
Implantable Lead Implant Date: 20240214
Implantable Lead Implant Date: 20240214
Implantable Lead Location: 753859
Implantable Lead Location: 753860
Implantable Lead Location: 753860
Implantable Lead Model: 3830
Implantable Lead Model: 7122
Implantable Pulse Generator Implant Date: 20240214
Lead Channel Impedance Value: 430 Ohm
Lead Channel Impedance Value: 530 Ohm
Lead Channel Impedance Value: 610 Ohm
Lead Channel Pacing Threshold Amplitude: 0.5 V
Lead Channel Pacing Threshold Amplitude: 0.75 V
Lead Channel Pacing Threshold Amplitude: 1 V
Lead Channel Pacing Threshold Pulse Width: 0.5 ms
Lead Channel Pacing Threshold Pulse Width: 0.5 ms
Lead Channel Pacing Threshold Pulse Width: 0.5 ms
Lead Channel Sensing Intrinsic Amplitude: 11.9 mV
Lead Channel Sensing Intrinsic Amplitude: 2.1 mV
Lead Channel Setting Pacing Amplitude: 2 V
Lead Channel Setting Pacing Amplitude: 2.5 V
Lead Channel Setting Pacing Amplitude: 2.5 V
Lead Channel Setting Pacing Pulse Width: 0.5 ms
Lead Channel Setting Pacing Pulse Width: 0.5 ms
Lead Channel Setting Sensing Sensitivity: 0.5 mV
Pulse Gen Serial Number: 5559500
Zone Setting Status: 755011

## 2024-07-14 ENCOUNTER — Ambulatory Visit: Payer: Self-pay | Admitting: Cardiology

## 2024-07-18 ENCOUNTER — Other Ambulatory Visit (HOSPITAL_COMMUNITY): Payer: Self-pay | Admitting: Internal Medicine

## 2024-07-18 ENCOUNTER — Other Ambulatory Visit: Payer: Self-pay

## 2024-07-18 ENCOUNTER — Other Ambulatory Visit (HOSPITAL_COMMUNITY): Payer: Self-pay

## 2024-07-18 MED ORDER — METOPROLOL SUCCINATE ER 100 MG PO TB24
150.0000 mg | ORAL_TABLET | Freq: Every day | ORAL | 0 refills | Status: DC
  Filled 2024-07-18: qty 45, 30d supply, fill #0

## 2024-08-16 NOTE — Progress Notes (Signed)
Remote ICD Transmission.

## 2024-08-25 ENCOUNTER — Other Ambulatory Visit: Payer: Self-pay | Admitting: Internal Medicine

## 2024-08-25 DIAGNOSIS — Z1211 Encounter for screening for malignant neoplasm of colon: Secondary | ICD-10-CM

## 2024-08-28 ENCOUNTER — Other Ambulatory Visit: Payer: Self-pay | Admitting: Internal Medicine

## 2024-08-28 ENCOUNTER — Other Ambulatory Visit (HOSPITAL_COMMUNITY): Payer: Self-pay

## 2024-08-28 ENCOUNTER — Other Ambulatory Visit: Payer: Self-pay | Admitting: Cardiology

## 2024-08-28 ENCOUNTER — Encounter (HOSPITAL_COMMUNITY): Payer: Self-pay

## 2024-08-28 ENCOUNTER — Other Ambulatory Visit: Payer: Self-pay

## 2024-08-28 ENCOUNTER — Other Ambulatory Visit (HOSPITAL_COMMUNITY): Payer: Self-pay | Admitting: Internal Medicine

## 2024-08-28 DIAGNOSIS — E119 Type 2 diabetes mellitus without complications: Secondary | ICD-10-CM

## 2024-08-28 MED ORDER — APIXABAN 2.5 MG PO TABS
2.5000 mg | ORAL_TABLET | Freq: Two times a day (BID) | ORAL | 0 refills | Status: DC
  Filled 2024-08-28: qty 60, 30d supply, fill #0

## 2024-08-28 MED ORDER — METOPROLOL SUCCINATE ER 100 MG PO TB24
150.0000 mg | ORAL_TABLET | Freq: Every day | ORAL | 0 refills | Status: DC
  Filled 2024-08-28: qty 45, 30d supply, fill #0

## 2024-08-29 ENCOUNTER — Other Ambulatory Visit: Payer: Self-pay

## 2024-08-29 ENCOUNTER — Other Ambulatory Visit (HOSPITAL_COMMUNITY): Payer: Self-pay

## 2024-08-29 MED ORDER — SPIRONOLACTONE 25 MG PO TABS
12.5000 mg | ORAL_TABLET | Freq: Every day | ORAL | 0 refills | Status: DC
  Filled 2024-08-29: qty 15, 30d supply, fill #0

## 2024-08-29 MED ORDER — METFORMIN HCL ER 750 MG PO TB24
1500.0000 mg | ORAL_TABLET | Freq: Every day | ORAL | 0 refills | Status: DC
  Filled 2024-08-29: qty 60, 30d supply, fill #0
  Filled 2024-09-01 – 2024-09-30 (×2): qty 60, 30d supply, fill #1
  Filled 2024-11-05: qty 60, 30d supply, fill #2

## 2024-09-01 ENCOUNTER — Other Ambulatory Visit (HOSPITAL_COMMUNITY): Payer: Self-pay

## 2024-09-01 ENCOUNTER — Other Ambulatory Visit (HOSPITAL_COMMUNITY): Payer: Self-pay | Admitting: Internal Medicine

## 2024-09-01 MED ORDER — SACUBITRIL-VALSARTAN 97-103 MG PO TABS
1.0000 | ORAL_TABLET | Freq: Two times a day (BID) | ORAL | 0 refills | Status: DC
  Filled 2024-09-01: qty 60, 30d supply, fill #0

## 2024-09-12 ENCOUNTER — Telehealth (HOSPITAL_COMMUNITY): Payer: Self-pay

## 2024-09-12 NOTE — Telephone Encounter (Signed)
 Called to confirm/remind patient of their appointment [facility] on 09/13/24 3:30.   Appointment:   [] Confirmed  [x] Left mess   [] No answer/No voice mail  [] VM Full/unable to leave message  [] Phone not in service  Patient reminded to bring all medications and/or complete list.  Confirmed patient has transportation. Gave directions, instructed to utilize valet parking.

## 2024-09-13 ENCOUNTER — Other Ambulatory Visit (HOSPITAL_COMMUNITY): Payer: Self-pay

## 2024-09-13 ENCOUNTER — Ambulatory Visit (HOSPITAL_COMMUNITY)
Admission: RE | Admit: 2024-09-13 | Discharge: 2024-09-13 | Disposition: A | Discharge status: Home / Self Care | Source: Ambulatory Visit | Attending: Internal Medicine | Admitting: Internal Medicine

## 2024-09-13 ENCOUNTER — Encounter (HOSPITAL_COMMUNITY): Payer: Self-pay

## 2024-09-13 VITALS — BP 128/74 | HR 87 | Ht 72.0 in | Wt 224.0 lb

## 2024-09-13 DIAGNOSIS — Z4502 Encounter for adjustment and management of automatic implantable cardiac defibrillator: Secondary | ICD-10-CM | POA: Insufficient documentation

## 2024-09-13 DIAGNOSIS — G4733 Obstructive sleep apnea (adult) (pediatric): Secondary | ICD-10-CM | POA: Insufficient documentation

## 2024-09-13 DIAGNOSIS — I5022 Chronic systolic (congestive) heart failure: Secondary | ICD-10-CM | POA: Insufficient documentation

## 2024-09-13 DIAGNOSIS — R42 Dizziness and giddiness: Secondary | ICD-10-CM | POA: Diagnosis not present

## 2024-09-13 DIAGNOSIS — Z86711 Personal history of pulmonary embolism: Secondary | ICD-10-CM | POA: Diagnosis not present

## 2024-09-13 DIAGNOSIS — Z7984 Long term (current) use of oral hypoglycemic drugs: Secondary | ICD-10-CM | POA: Diagnosis not present

## 2024-09-13 DIAGNOSIS — D8685 Sarcoid myocarditis: Secondary | ICD-10-CM | POA: Diagnosis not present

## 2024-09-13 DIAGNOSIS — F1010 Alcohol abuse, uncomplicated: Secondary | ICD-10-CM | POA: Diagnosis not present

## 2024-09-13 DIAGNOSIS — R9431 Abnormal electrocardiogram [ECG] [EKG]: Secondary | ICD-10-CM | POA: Diagnosis not present

## 2024-09-13 DIAGNOSIS — Z79631 Long term (current) use of antimetabolite agent: Secondary | ICD-10-CM | POA: Insufficient documentation

## 2024-09-13 DIAGNOSIS — I2699 Other pulmonary embolism without acute cor pulmonale: Secondary | ICD-10-CM | POA: Diagnosis not present

## 2024-09-13 DIAGNOSIS — Z79899 Other long term (current) drug therapy: Secondary | ICD-10-CM | POA: Insufficient documentation

## 2024-09-13 DIAGNOSIS — I110 Hypertensive heart disease with heart failure: Secondary | ICD-10-CM | POA: Diagnosis not present

## 2024-09-13 DIAGNOSIS — E119 Type 2 diabetes mellitus without complications: Secondary | ICD-10-CM | POA: Diagnosis not present

## 2024-09-13 DIAGNOSIS — Z7901 Long term (current) use of anticoagulants: Secondary | ICD-10-CM | POA: Insufficient documentation

## 2024-09-13 DIAGNOSIS — Z87891 Personal history of nicotine dependence: Secondary | ICD-10-CM | POA: Diagnosis not present

## 2024-09-13 DIAGNOSIS — Z59868 Other specified financial insecurity: Secondary | ICD-10-CM | POA: Insufficient documentation

## 2024-09-13 LAB — BASIC METABOLIC PANEL WITH GFR
Anion gap: 16 — ABNORMAL HIGH (ref 5–15)
BUN: 16 mg/dL (ref 6–20)
CO2: 28 mmol/L (ref 22–32)
Calcium: 10.1 mg/dL (ref 8.9–10.3)
Chloride: 91 mmol/L — ABNORMAL LOW (ref 98–111)
Creatinine, Ser: 0.92 mg/dL (ref 0.61–1.24)
GFR, Estimated: >60 mL/min (ref >60)
Glucose, Bld: 240 mg/dL — ABNORMAL HIGH (ref 70–99)
Potassium: 4.4 mmol/L (ref 3.5–5.1)
Sodium: 135 mmol/L (ref 135–145)

## 2024-09-13 LAB — BRAIN NATRIURETIC PEPTIDE: B Natriuretic Peptide: 25 pg/mL (ref 0.0–100.0)

## 2024-09-13 MED ORDER — SPIRONOLACTONE 25 MG PO TABS
25.0000 mg | ORAL_TABLET | Freq: Every day | ORAL | 5 refills | Status: AC
  Filled 2024-09-13 – 2024-09-30 (×2): qty 30, 30d supply, fill #0
  Filled 2024-11-05: qty 30, 30d supply, fill #1
  Filled 2024-12-20: qty 30, 30d supply, fill #2

## 2024-09-13 MED ORDER — METHOTREXATE SODIUM 2.5 MG PO TABS
10.0000 mg | ORAL_TABLET | ORAL | 0 refills | Status: AC
  Filled 2024-09-13 – 2024-09-30 (×2): qty 16, 28d supply, fill #0

## 2024-09-13 NOTE — Progress Notes (Signed)
 ADVANCED [facility] CLINIC NOTE  Referring Physician: Dr. Pietro Primary Care: Gregory Debby CROME, MD  HPI: Gregory Stevenson is a 48 y.o. male with h/o ETOH (quit 2021), DM2, OSA, HTN.   Admitted 1/22 with pulmonary embolus.  Echo at that time revealed EF 30 to 35%, mild left ventricular enlargement, mild right ventricular enlargement, mild RV dysfunction, moderate pulmonary hypertension, mild left atrial enlargement and mild mitral regurgitation. Patient was treated medically. LV dysfunction felt possibly related to ETOH use.  Echo 5/22: EF 30-35%.  CTA August 2022 showed calcium  score 0, normal coronary arteries and mildly dilated pulmonary artery.    Cardiac MRI August 2022: LVEF 20%, RVEF 43%, LGE pattern suggestive of sarcoid   PET 12/22  1. Extensive active myocardial inflammation involving nearly the entire left ventricle. Inflammatory findings are most severe in the anterior and inferior walls, and spare the basal anterolateral/ inferolateral and apical inferior segments. The metabolic and perfusion findings above are compatible with cardiac sarcoidosis and cardiomyopathy in the appropriate clinical setting.   2. FDG-avid thoracic and upper abdominal lymphadenopathy, hypermetabolic foci in the liver and spleen, and several FDG avid lesions in a mid- thoracic vertebral body. Taken together these findings most likely represent extensive extra-cardiac sarcoidosis, although correlation with any prior contrast-enhanced cross-sectional imaging is suggested. If prior imaging is unavailable and if clinically warranted, further imaging could be obtained including a liver MRI with contrast and whole body bone scan.   Last seen for f/u 12/11/21. Started on prednisone  + MTX for sarcoid. Volume appeared stable.  14 day Zio 01/19-02/01/23: Sinus rhythm with 25 runs NSVT up to 13 beats in duration, 1.6% PVCs, 5 beat run SVT, wenckebach AV block present  Completed prednisone  in 7/23. On MTX 20mg  q  wed.  S/p MDT CRT-D 2/24   PET 10/24: EF 42%, no active sarcoid. 5.5 x 2.1 cm masslike focus of consolidative airspace disease is seen in the left lung base. Referral placed for pulmonology.   Pulmonology f/u. Area of concern suspected to be infectious vs inflammatory. Follow up CT 1/25 with resolved multilobular LL PNA, no active pulmonary disease.  Today he returns for AHF follow up, last visit here was 5/24. Overall feeling good. Denies palpitations, CP, edema, or PND/Orthopnea. Only has dizziness if he stands too fast. No SOB. Appetite ok, occasionally eats out. No fever or chills. Weight at home 220-223 pounds, weights weekly. Taking all medications. Denies tobacco or drug use. Drinks ETOH on the weekends, beers. Does not drive, walks everywhere. Goes up and down stairs without issues. Writes and draws Physiological scientist. Has not used lasix  in years.   ST Jude ICD interrogation 98% biv pacing impedance ok. No VT. NSVT x1 0.06 seconds.  Corvue stable.   Review of Systems: Cardiac and Respiratory - Negative except as mentioned in HPI  Past Medical History:  Diagnosis Date   Alcoholic fatty liver    Alcoholism (HCC)    sober since 10/2020   Anxiety    Chronic systolic CHF (congestive heart failure) (HCC)    HTN (hypertension)    OSA (obstructive sleep apnea)    on CPAP   Type 2 diabetes mellitus (HCC)     Current Outpatient Medications  Medication Sig Dispense Refill   apixaban  (ELIQUIS ) 2.5 MG TABS tablet Take 1 tablet (2.5 mg total) by mouth 2 (two) times daily. PLEASE CALL [phone] OPTION 2 TO SCHEDULE APPOINTMENT FOR MORE REFILLS 60 tablet 0   Cyanocobalamin (B-12 PO) Take 1 tablet by mouth  daily.     empagliflozin  (JARDIANCE ) 10 MG TABS tablet Take 1 tablet (10 mg total) by mouth daily before breakfast. 90 tablet 3   folic acid  (FOLVITE ) 1 MG tablet Take 2 tablets (2 mg total) by mouth daily. (Patient taking differently: Take 1 mg by mouth 2 (two) times daily.) 180 tablet 1    metFORMIN  (GLUCOPHAGE -XR) 750 MG 24 hr tablet Take 2 tablets (1,500 mg total) by mouth daily with breakfast. Please schedule an appointment with Dr. Joshua for further refills. 180 tablet 0   metoprolol  succinate (TOPROL -XL) 100 MG 24 hr tablet Take 1 & 1/2 tablets (150 mg total) by mouth daily. Take with or immediately following a meal. PLEASE CALL [phone] OPTION 2 TO SCHEDULE APPOINTMENT FOR MORE REFILLS 45 tablet 0   PARoxetine  (PAXIL ) 20 MG tablet Take 1 tablet (20 mg total) by mouth daily. 90 tablet 1   rosuvastatin  (CRESTOR ) 10 MG tablet Take 1 tablet (10 mg total) by mouth daily. 90 tablet 1   sacubitril -valsartan  (ENTRESTO ) 97-103 MG Take 1 tablet by mouth 2 (two) times daily. 60 tablet 0   methotrexate  (RHEUMATREX) 2.5 MG tablet Take 4 tablets (10 mg total) by mouth once a week. 16 tablet 0   spironolactone  (ALDACTONE ) 25 MG tablet Take 1 tablet (25 mg total) by mouth daily. 30 tablet 5   No current facility-administered medications for this encounter.    No Known Allergies    Social History   Socioeconomic History   Marital status: Divorced    Spouse name: Not on file   Number of children: Not on file   Years of education: Not on file   Highest education level: Bachelor's degree (e.g., BA, AB, BS)  Occupational History   Not on file  Tobacco Use   Smoking status: Former    Types: Cigarettes   Smokeless tobacco: Never   Tobacco comments:    Patient quit 20 years ago  Substance and Sexual Activity   Alcohol use: Yes    Alcohol/week: 3.0 standard drinks of alcohol    Types: 3 Standard drinks or equivalent per week    Comment: prior alcohol abuse; sober for 1 month   Drug use: Never   Sexual activity: Not Currently  Other Topics Concern   Not on file  Social History Narrative   Not on file   Social Drivers of Health   Financial Resource Strain: Medium Risk (04/18/2024)   Overall Financial Resource Strain (CARDIA)    Difficulty of Paying Living Expenses: Somewhat  hard  Food Insecurity: No Food Insecurity (04/18/2024)   Hunger Vital Sign    Worried About Running Out of Food in the Last Year: Never true    Ran Out of Food in the Last Year: Never true  Transportation Needs: No Transportation Needs (04/18/2024)   PRAPARE - Administrator, Civil Service (Medical): No    Lack of Transportation (Non-Medical): No  Physical Activity: Insufficiently Active (04/18/2024)   Exercise Vital Sign    Days of Exercise per Week: 3 days    Minutes of Exercise per Session: 40 min  Stress: No Stress Concern Present (04/18/2024)   [employer] of Occupational Health - Occupational Stress Questionnaire    Feeling of Stress : Only a little  Social Connections: Socially Isolated (04/18/2024)   Social Connection and Isolation Panel    Frequency of Communication with Friends and Family: Twice a week    Frequency of Social Gatherings with Friends and Family: More than three  times a week    Attends Religious Services: Never    Active Member of Clubs or Organizations: No    Attends Engineer, structural: Not on file    Marital Status: Divorced  Catering manager Violence: Not on file      Family History  Problem Relation Age of Onset   Diabetes Mellitus II Maternal Grandfather    Pulmonary embolism Neg Hx    CAD Neg Hx     Vitals:   09/13/24 1532  BP: 128/74  Pulse: 87  SpO2: 96%  Weight: 101.6 kg (224 lb)  Height: 6' (1.829 m)    PHYSICAL EXAM: General:  well appearing.  No respiratory difficulty. Walked into clinic.  Neck: JVD flat.  Cor: Regular rate & rhythm. No murmurs. Lungs: clear Extremities: no edema  Neuro: alert & oriented x 3. Affect pleasant.   EKG A sensed- V paced 85 bpm (Personally reviewed)    ASSESSMENT & PLAN:  1. 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 On MTX 20mg  q wed. - s/p MDT CRT-D 2/24  - Doing well NYHA I - Volume status ok. Lasix  prn only thought he has not had to use in years.  - Continue Entresto  97/103 mg BID - Increase spiro 12.5>25 daily. BMET/BNP today, repeat BMET in 1 week.  - Continue toprol  150 mg daily - Continue Farxiga  10 daily. Denies GU symptoms - PET 10/24 with EF 42%, no active sarcoid. 5.5 x 2.1 cm masslike focus of consolidative airspace disease is seen in the left lung base. Referral placed for pulmonology (Now resolved, CT's consistent with resolved LL PNA) - Update echo  2. Cardiac sarcoidosis - cMRI 8/22 EF 20% RVEF 43%  LGE c/w sarcoid - PET 12/22 c/w diffuse active sarcoid EF 28% - Has RBBB on ECG. 14 day zio w/ 25 runs NSVT, 1 SVT, 1.6% PVCs, wenckebach AV block - Completed prednisone  in 7/23. On MTX 10mg  q wed. (Dose cut back 2/2 leukopenia and thrombocytopenia) - Pharmacy did not fill his methotrexate  during last fill, has not taken in 2 weeks. Suspect because he is overdue for f/u with Dr. Mai. Will do curtesy fill today. I asked him to call Dr. Eustacio office tomorrow to arrange f/u.  - s/p MDT CRT-D 2/24  - Followed by Dr. Mai with [facility] rheumatology.   3. DM2 - Continue Farxiga   4. ETOH abuse - has cut back but still drinks several beers on the weekends. Limit as much as possible  5. PE - continue apixaban  - On Eliquis  2.5 BID based on [facility]-EXT trial  6. OSA - On CPAP - Refer to Dr. Shlomo for further options. May need to repeat sleep study.   Follow up in 6 months with Dr. Bensimhon. Update echo in the meantime.   Gregory LITTIE Coe, NP  4:22 PM

## 2024-09-13 NOTE — Patient Instructions (Addendum)
 Medication Changes:  INCREASE SPIRONOLACTONE  TO 25MG  ONCE DAILY   METHOTREXATE  REFILLED--PLEASE CONTACT DR. BEEKMAN'S OFFICE FOR FUTURE REFILLS  Lab Work:  Labs done today, your results will be available in MyChart, we will contact you for abnormal readings.  REPEAT LABS IN 7 DAYS AS SCHEDULED   Testing/Procedures:  Your physician has requested that you have an echocardiogram. Echocardiography is a painless test that uses sound waves to create images of your heart. It provides your doctor with information about the size and shape of your heart and how well your heart's chambers and valves are working. This procedure takes approximately one hour. There are no restrictions for this procedure. Please do NOT wear cologne, perfume, aftershave, or lotions (deodorant is allowed). Please arrive 15 minutes prior to your appointment time.  Please note: We ask at that you not bring children with you during ultrasound (echo/ vascular) testing. Due to room size and safety concerns, children are not allowed in the ultrasound rooms during exams. Our front office staff cannot provide observation of children in our lobby area while testing is being conducted. An adult accompanying a patient to their appointment will only be allowed in the ultrasound room at the discretion of the ultrasound technician under special circumstances. We apologize for any inconvenience. AS SCHEDULED   YOU HAVE BEEN REFERRED TO Dr. Shlomo  THEY WILL REACH OUT TO YOU OR CALL TO ARRANGE THIS. PLEASE CALL US  WITH ANY CONCERNS   Follow-Up in: 6 MONTHS WITH DR. CHERRIE PLEASE CALL OUR OFFICE AROUND FEBRUARY TO GET SCHEDULED FOR YOUR APPOINTMENT. PHONE NUMBER IS [phone] OPTION 2   At the [facility], you and your health needs are our priority. We have a designated team specialized in the treatment of Heart Failure. This Care Team includes your primary Heart Failure Specialized Cardiologist (physician), Advanced  Practice Providers (APPs- Physician Assistants and Nurse Practitioners), and Pharmacist who all work together to provide you with the care you need, when you need it.   You may see any of the following providers on your designated Care Team at your next follow up:  Dr. Toribio CHERRIE Dr. Ezra Shuck Dr. Ria Commander Dr. Odis Brownie Greig Mosses, NP [place]affie Shed, [place] [facility] [place], [place] Beckey Coe, NP [place] Lee, NP Tinnie Redman, PharmD   Please be sure to bring in all your medications bottles to every appointment.   Need to Contact Us :  If you have any questions or concerns before your next appointment please send us  a message through [place] or call our office at [phone].    TO LEAVE A MESSAGE FOR THE NURSE SELECT OPTION 2, PLEASE LEAVE A MESSAGE INCLUDING: YOUR NAME DATE OF BIRTH CALL BACK NUMBER REASON FOR CALL**this is important as we prioritize the call backs  YOU WILL RECEIVE A CALL BACK THE SAME DAY AS LONG AS YOU CALL BEFORE 4:00 PM

## 2024-09-14 ENCOUNTER — Ambulatory Visit (HOSPITAL_COMMUNITY): Payer: Self-pay | Admitting: Internal Medicine

## 2024-09-14 DIAGNOSIS — D8685 Sarcoid myocarditis: Secondary | ICD-10-CM

## 2024-09-21 ENCOUNTER — Encounter: Payer: Self-pay | Admitting: Physician Assistant

## 2024-09-25 ENCOUNTER — Other Ambulatory Visit (HOSPITAL_COMMUNITY): Payer: Self-pay

## 2024-09-30 ENCOUNTER — Other Ambulatory Visit (HOSPITAL_COMMUNITY): Payer: Self-pay

## 2024-09-30 ENCOUNTER — Other Ambulatory Visit (HOSPITAL_COMMUNITY): Payer: Self-pay | Admitting: Internal Medicine

## 2024-10-02 ENCOUNTER — Other Ambulatory Visit: Payer: Self-pay

## 2024-10-02 ENCOUNTER — Other Ambulatory Visit (HOSPITAL_COMMUNITY): Payer: Self-pay

## 2024-10-02 MED ORDER — APIXABAN 2.5 MG PO TABS
2.5000 mg | ORAL_TABLET | Freq: Two times a day (BID) | ORAL | 0 refills | Status: DC
  Filled 2024-10-02: qty 60, 30d supply, fill #0

## 2024-10-02 MED ORDER — METOPROLOL SUCCINATE ER 100 MG PO TB24
150.0000 mg | ORAL_TABLET | Freq: Every day | ORAL | 0 refills | Status: DC
  Filled 2024-10-02: qty 45, 30d supply, fill #0

## 2024-10-03 ENCOUNTER — Ambulatory Visit (HOSPITAL_COMMUNITY)
Admission: RE | Admit: 2024-10-03 | Discharge: 2024-10-03 | Disposition: A | Discharge status: Home / Self Care | Source: Ambulatory Visit | Attending: Internal Medicine | Admitting: Internal Medicine

## 2024-10-03 DIAGNOSIS — I5022 Chronic systolic (congestive) heart failure: Secondary | ICD-10-CM | POA: Diagnosis not present

## 2024-10-03 DIAGNOSIS — I071 Rheumatic tricuspid insufficiency: Secondary | ICD-10-CM | POA: Insufficient documentation

## 2024-10-03 DIAGNOSIS — I3481 Nonrheumatic mitral (valve) annulus calcification: Secondary | ICD-10-CM | POA: Insufficient documentation

## 2024-10-03 DIAGNOSIS — Z006 Encounter for examination for normal comparison and control in clinical research program: Secondary | ICD-10-CM

## 2024-10-03 LAB — ECHOCARDIOGRAM COMPLETE
Area-P 1/2: 4.57 cm2
Calc EF: 36.4 %
S' Lateral: 4.7 cm
Single Plane A2C EF: 33.2 %
Single Plane A4C EF: 39.3 %

## 2024-10-03 NOTE — Research (Signed)
 SITE: [id number]     Subject # [id number]   Subprotocol: A  Inclusion Criteria  Patients who meet all of the following criteria are eligible for enrollment as study participants:  Yes No  Age > 50 years old X   Eligible to wear Holter Study X    Exclusion Criteria  Patients who meet any of these criteria are not eligible for enrollment as study participants: Yes No  1. Receiving any mechanical (respiratory or circulatory) or renal support therapy at Screening or during Visit #1.  X  2.  Any other conditions that in the opinion of the investigators are likely to prevent compliance with the study protocol or pose a safety concern if the subject participates in the study.  X  3. Poor tolerance, namely susceptible to severe skin allergies from ECG adhesive patch application.  X   Protocol: REV H    60 minute start window         Cor device must be applied, and the study initiated, no later than 60 minutes of completing the Echocardiogram                             HH:MM  Echo completion time  15:20  2.   Cor Study start time  15:24   30-Minute execution window  Once Cor Monitoring begins, 3 QT Med ECGs and the 15-minute rest period must be completed within a 30 minute window     HH:MM  3. QT Med ECG Completion time  15:33  4. Start of 15-Min sitting rest period  15:33  5. End of 15-Min rest period  15:49  6. Time of device removal  15:57   *Continue to use the Mobile App Event feature to log the Rest period windows and follow instructions on the EF-ACT Clinical Trial  Patient Instruction Card.  Describe any anomalies in Protocol execution in the Protocol Deviation Log    Residential Zip code [phone] (First 3 digits ONLY)                                           [facility] Informed Consent   Subject Name: Gregory Stevenson  Subject met inclusion and exclusion criteria.  The informed consent form, study requirements and expectations were reviewed with the subject. Subject had opportunity to read  consent and questions and concerns were addressed prior to the signing of the consent form.  The subject verbalized understanding of the trial requirements.  The subject agreed to participate in the [facility] EF trial and signed the informed consent at 15:22 on 03-Oct-2024.  The informed consent was obtained prior to performance of any protocol-specific procedures for the subject.  A copy of the signed informed consent was given to the subject and a copy was placed in the subject's medical record.   Dorthea LITTIE Louder          Current Outpatient Medications:    apixaban  (ELIQUIS ) 2.5 MG TABS tablet, Take 1 tablet (2.5 mg total) by mouth 2 (two) times daily. PLEASE CALL [phone] OPTION 2 TO SCHEDULE APPOINTMENT FOR MORE REFILLS, Disp: 60 tablet, Rfl: 0   Cyanocobalamin (B-12 PO), Take 1 tablet by mouth daily., Disp: , Rfl:    empagliflozin  (JARDIANCE ) 10 MG TABS tablet, Take 1 tablet (10 mg total) by mouth daily before breakfast., Disp: 90 tablet,  Rfl: 3   folic acid  (FOLVITE ) 1 MG tablet, Take 2 tablets (2 mg total) by mouth daily. (Patient taking differently: Take 1 mg by mouth 2 (two) times daily.), Disp: 180 tablet, Rfl: 1   metFORMIN  (GLUCOPHAGE -XR) 750 MG 24 hr tablet, Take 2 tablets (1,500 mg total) by mouth daily with breakfast. Please schedule an appointment with Dr. Joshua for further refills., Disp: 180 tablet, Rfl: 0   methotrexate  (RHEUMATREX) 2.5 MG tablet, Take 4 tablets (10 mg total) by mouth once a week., Disp: 16 tablet, Rfl: 0   metoprolol  succinate (TOPROL -XL) 100 MG 24 hr tablet, Take 1 & 1/2 tablets (150 mg total) by mouth daily. Take with or immediately following a meal. PLEASE CALL [phone] OPTION 2 TO SCHEDULE APPOINTMENT FOR MORE REFILLS, Disp: 45 tablet, Rfl: 0   PARoxetine  (PAXIL ) 20 MG tablet, Take 1 tablet (20 mg total) by mouth daily., Disp: 90 tablet, Rfl: 1   rosuvastatin  (CRESTOR ) 10 MG tablet, Take 1 tablet (10 mg total) by mouth daily., Disp: 90 tablet, Rfl: 1    sacubitril -valsartan  (ENTRESTO ) 97-103 MG, Take 1 tablet by mouth 2 (two) times daily., Disp: 60 tablet, Rfl: 0   spironolactone  (ALDACTONE ) 25 MG tablet, Take 1 tablet (25 mg total) by mouth daily., Disp: 30 tablet, Rfl: 5

## 2024-10-06 ENCOUNTER — Ambulatory Visit (INDEPENDENT_AMBULATORY_CARE_PROVIDER_SITE_OTHER): Payer: 59

## 2024-10-06 DIAGNOSIS — I5022 Chronic systolic (congestive) heart failure: Secondary | ICD-10-CM

## 2024-10-08 LAB — CUP PACEART REMOTE DEVICE CHECK
Battery Remaining Longevity: 56 mo
Battery Remaining Percentage: 71 %
Battery Voltage: 2.98 V
Brady Statistic AP VP Percent: 1.3 %
Brady Statistic AP VS Percent: 1 %
Brady Statistic AS VP Percent: 97 %
Brady Statistic AS VS Percent: 1 %
Brady Statistic RA Percent Paced: 1 %
Date Time Interrogation Session: 20251114020018
HighPow Impedance: 86 Ohm
HighPow Impedance: 86 Ohm
Implantable Lead Connection Status: 753985
Implantable Lead Connection Status: 753985
Implantable Lead Connection Status: 753985
Implantable Lead Implant Date: 20240214
Implantable Lead Implant Date: 20240214
Implantable Lead Implant Date: 20240214
Implantable Lead Location: 753859
Implantable Lead Location: 753860
Implantable Lead Location: 753860
Implantable Lead Model: 3830
Implantable Lead Model: 7122
Implantable Pulse Generator Implant Date: 20240214
Lead Channel Impedance Value: 400 Ohm
Lead Channel Impedance Value: 510 Ohm
Lead Channel Impedance Value: 590 Ohm
Lead Channel Pacing Threshold Amplitude: 0.5 V
Lead Channel Pacing Threshold Amplitude: 0.75 V
Lead Channel Pacing Threshold Amplitude: 1 V
Lead Channel Pacing Threshold Pulse Width: 0.5 ms
Lead Channel Pacing Threshold Pulse Width: 0.5 ms
Lead Channel Pacing Threshold Pulse Width: 0.5 ms
Lead Channel Sensing Intrinsic Amplitude: 12 mV
Lead Channel Sensing Intrinsic Amplitude: 2.7 mV
Lead Channel Setting Pacing Amplitude: 2 V
Lead Channel Setting Pacing Amplitude: 2.5 V
Lead Channel Setting Pacing Amplitude: 2.5 V
Lead Channel Setting Pacing Pulse Width: 0.5 ms
Lead Channel Setting Pacing Pulse Width: 0.5 ms
Lead Channel Setting Sensing Sensitivity: 0.5 mV
Pulse Gen Serial Number: 5559500
Zone Setting Status: 755011

## 2024-10-09 NOTE — Progress Notes (Signed)
 Remote ICD Transmission

## 2024-10-11 ENCOUNTER — Ambulatory Visit: Payer: Self-pay | Admitting: Cardiology

## 2024-11-05 ENCOUNTER — Other Ambulatory Visit (HOSPITAL_COMMUNITY): Payer: Self-pay | Admitting: Internal Medicine

## 2024-11-05 ENCOUNTER — Other Ambulatory Visit (HOSPITAL_COMMUNITY): Payer: Self-pay

## 2024-11-06 ENCOUNTER — Other Ambulatory Visit: Payer: Self-pay

## 2024-11-06 ENCOUNTER — Other Ambulatory Visit (HOSPITAL_COMMUNITY): Payer: Self-pay

## 2024-11-06 MED ORDER — FOLIC ACID 1 MG PO TABS
2.0000 mg | ORAL_TABLET | Freq: Every day | ORAL | 0 refills | Status: AC
  Filled 2024-11-06: qty 60, 30d supply, fill #0
  Filled 2024-12-20: qty 60, 30d supply, fill #1

## 2024-11-06 MED ORDER — APIXABAN 2.5 MG PO TABS
2.5000 mg | ORAL_TABLET | Freq: Two times a day (BID) | ORAL | 0 refills | Status: DC
  Filled 2024-11-06: qty 60, 30d supply, fill #0

## 2024-11-06 MED ORDER — METOPROLOL SUCCINATE ER 100 MG PO TB24
150.0000 mg | ORAL_TABLET | Freq: Every day | ORAL | 0 refills | Status: DC
  Filled 2024-11-06: qty 45, 30d supply, fill #0

## 2024-11-06 MED ORDER — SACUBITRIL-VALSARTAN 97-103 MG PO TABS
1.0000 | ORAL_TABLET | Freq: Two times a day (BID) | ORAL | 0 refills | Status: DC
  Filled 2024-11-06: qty 60, 30d supply, fill #0

## 2024-11-10 ENCOUNTER — Other Ambulatory Visit (HOSPITAL_COMMUNITY): Payer: Self-pay

## 2024-11-21 ENCOUNTER — Encounter (HOSPITAL_COMMUNITY): Payer: Self-pay | Admitting: *Deleted

## 2024-12-07 ENCOUNTER — Encounter (HOSPITAL_COMMUNITY)

## 2024-12-20 ENCOUNTER — Other Ambulatory Visit (HOSPITAL_COMMUNITY): Payer: Self-pay

## 2024-12-20 ENCOUNTER — Other Ambulatory Visit (HOSPITAL_COMMUNITY): Payer: Self-pay | Admitting: Internal Medicine

## 2024-12-20 ENCOUNTER — Other Ambulatory Visit: Payer: Self-pay | Admitting: Internal Medicine

## 2024-12-20 ENCOUNTER — Other Ambulatory Visit: Payer: Self-pay

## 2024-12-20 DIAGNOSIS — E785 Hyperlipidemia, unspecified: Secondary | ICD-10-CM

## 2024-12-20 DIAGNOSIS — F3342 Major depressive disorder, recurrent, in full remission: Secondary | ICD-10-CM

## 2024-12-20 DIAGNOSIS — E119 Type 2 diabetes mellitus without complications: Secondary | ICD-10-CM

## 2024-12-20 MED ORDER — METOPROLOL SUCCINATE ER 100 MG PO TB24
150.0000 mg | ORAL_TABLET | Freq: Every day | ORAL | 1 refills | Status: AC
  Filled 2024-12-20: qty 90, 60d supply, fill #0

## 2024-12-20 MED ORDER — METFORMIN HCL ER 750 MG PO TB24
1500.0000 mg | ORAL_TABLET | Freq: Every day | ORAL | 0 refills | Status: AC
  Filled 2024-12-20: qty 60, 30d supply, fill #0

## 2024-12-20 MED ORDER — PAROXETINE HCL 20 MG PO TABS
20.0000 mg | ORAL_TABLET | Freq: Every day | ORAL | 0 refills | Status: AC
  Filled 2024-12-20: qty 30, 30d supply, fill #0

## 2024-12-20 MED ORDER — ROSUVASTATIN CALCIUM 10 MG PO TABS
10.0000 mg | ORAL_TABLET | Freq: Every day | ORAL | 0 refills | Status: AC
  Filled 2024-12-20: qty 30, 30d supply, fill #0

## 2024-12-20 MED ORDER — APIXABAN 2.5 MG PO TABS
2.5000 mg | ORAL_TABLET | Freq: Two times a day (BID) | ORAL | 1 refills | Status: AC
  Filled 2024-12-20: qty 120, 60d supply, fill #0

## 2024-12-20 MED ORDER — SACUBITRIL-VALSARTAN 97-103 MG PO TABS
1.0000 | ORAL_TABLET | Freq: Two times a day (BID) | ORAL | 2 refills | Status: AC
  Filled 2024-12-20: qty 60, 30d supply, fill #0

## 2024-12-22 ENCOUNTER — Other Ambulatory Visit (HOSPITAL_COMMUNITY): Payer: Self-pay

## 2024-12-22 ENCOUNTER — Other Ambulatory Visit (HOSPITAL_COMMUNITY): Payer: Self-pay | Admitting: Internal Medicine

## 2024-12-25 ENCOUNTER — Other Ambulatory Visit (HOSPITAL_COMMUNITY): Payer: Self-pay

## 2024-12-25 ENCOUNTER — Other Ambulatory Visit (HOSPITAL_COMMUNITY): Payer: Self-pay | Admitting: Internal Medicine

## 2024-12-27 ENCOUNTER — Other Ambulatory Visit (HOSPITAL_COMMUNITY): Payer: Self-pay

## 2025-04-06 ENCOUNTER — Ambulatory Visit

## 2025-07-06 ENCOUNTER — Ambulatory Visit

## 2025-10-05 ENCOUNTER — Ambulatory Visit

## 2026-01-04 ENCOUNTER — Ambulatory Visit

## 2026-04-05 ENCOUNTER — Ambulatory Visit
